# Patient Record
Sex: Female | Born: 1955
Health system: Southern US, Community
[De-identification: ages and names within clinical notes are randomized; demographics above are authoritative.]

## PROBLEM LIST (undated history)

## (undated) DIAGNOSIS — I340 Nonrheumatic mitral (valve) insufficiency: Secondary | ICD-10-CM

## (undated) DIAGNOSIS — J329 Chronic sinusitis, unspecified: Secondary | ICD-10-CM

## (undated) DIAGNOSIS — F419 Anxiety disorder, unspecified: Secondary | ICD-10-CM

## (undated) DIAGNOSIS — E7800 Pure hypercholesterolemia, unspecified: Secondary | ICD-10-CM

## (undated) DIAGNOSIS — K219 Gastro-esophageal reflux disease without esophagitis: Secondary | ICD-10-CM

## (undated) DIAGNOSIS — Z8639 Personal history of other endocrine, nutritional and metabolic disease: Secondary | ICD-10-CM

## (undated) DIAGNOSIS — R011 Cardiac murmur, unspecified: Secondary | ICD-10-CM

## (undated) DIAGNOSIS — E785 Hyperlipidemia, unspecified: Secondary | ICD-10-CM

## (undated) DIAGNOSIS — Z9889 Other specified postprocedural states: Secondary | ICD-10-CM

## (undated) DIAGNOSIS — I493 Ventricular premature depolarization: Secondary | ICD-10-CM

## (undated) DIAGNOSIS — K5790 Diverticulosis of intestine, part unspecified, without perforation or abscess without bleeding: Secondary | ICD-10-CM

## (undated) DIAGNOSIS — G4700 Insomnia, unspecified: Secondary | ICD-10-CM

## (undated) DIAGNOSIS — R7303 Prediabetes: Secondary | ICD-10-CM

## (undated) HISTORY — DX: Ventricular premature depolarization: I49.3

## (undated) HISTORY — DX: Anxiety disorder, unspecified: F41.9

## (undated) HISTORY — DX: Gastro-esophageal reflux disease without esophagitis: K21.9

## (undated) HISTORY — DX: Cardiac murmur, unspecified: R01.1

## (undated) HISTORY — PX: DILATION AND CURETTAGE OF UTERUS: SHX78

## (undated) HISTORY — DX: Pure hypercholesterolemia, unspecified: E78.00

## (undated) HISTORY — DX: Prediabetes: R73.03

## (undated) HISTORY — DX: Insomnia, unspecified: G47.00

## (undated) HISTORY — DX: Diverticulosis of intestine, part unspecified, without perforation or abscess without bleeding: K57.90

## (undated) HISTORY — DX: Hyperlipidemia, unspecified: E78.5

## (undated) HISTORY — DX: Personal history of other endocrine, nutritional and metabolic disease: Z86.39

## (undated) HISTORY — PX: BUNIONECTOMY: SHX129

## (undated) HISTORY — DX: Nonrheumatic mitral (valve) insufficiency: I34.0

---

## 2010-08-22 ENCOUNTER — Encounter
Admission: RE | Admit: 2010-08-22 | Discharge: 2010-08-22 | Payer: Self-pay | Source: Home / Self Care | Attending: Internal Medicine | Admitting: Internal Medicine

## 2010-09-07 ENCOUNTER — Other Ambulatory Visit: Payer: Self-pay | Admitting: Obstetrics and Gynecology

## 2010-09-07 ENCOUNTER — Other Ambulatory Visit
Admission: RE | Admit: 2010-09-07 | Discharge: 2010-09-07 | Payer: Self-pay | Source: Home / Self Care | Admitting: Obstetrics and Gynecology

## 2011-02-01 ENCOUNTER — Other Ambulatory Visit (HOSPITAL_COMMUNITY): Payer: Self-pay | Admitting: Cardiology

## 2011-02-01 DIAGNOSIS — R5383 Other fatigue: Secondary | ICD-10-CM

## 2011-02-21 ENCOUNTER — Ambulatory Visit (HOSPITAL_COMMUNITY)
Admission: RE | Admit: 2011-02-21 | Discharge: 2011-02-21 | Disposition: A | Discharge status: Home / Self Care | Payer: 59 | Source: Ambulatory Visit | Attending: Cardiology | Admitting: Cardiology

## 2011-02-21 DIAGNOSIS — I059 Rheumatic mitral valve disease, unspecified: Secondary | ICD-10-CM | POA: Insufficient documentation

## 2011-02-28 ENCOUNTER — Other Ambulatory Visit (HOSPITAL_COMMUNITY): Payer: Self-pay | Admitting: Cardiology

## 2011-02-28 ENCOUNTER — Encounter (HOSPITAL_COMMUNITY)
Admission: RE | Admit: 2011-02-28 | Discharge: 2011-02-28 | Disposition: A | Discharge status: Home / Self Care | Payer: 59 | Source: Ambulatory Visit | Attending: Cardiology | Admitting: Cardiology

## 2011-02-28 DIAGNOSIS — R079 Chest pain, unspecified: Secondary | ICD-10-CM | POA: Insufficient documentation

## 2011-02-28 MED ORDER — TECHNETIUM TC 99M TETROFOSMIN IV KIT
10.0000 | PACK | Freq: Once | INTRAVENOUS | Status: AC | PRN
  Administered 2011-02-28: 10 via INTRAVENOUS

## 2011-02-28 MED ORDER — TECHNETIUM TC 99M TETROFOSMIN IV KIT
30.0000 | PACK | Freq: Once | INTRAVENOUS | Status: AC | PRN
  Administered 2011-02-28: 30 via INTRAVENOUS

## 2011-03-01 ENCOUNTER — Other Ambulatory Visit (HOSPITAL_COMMUNITY): Payer: Self-pay

## 2011-03-01 ENCOUNTER — Other Ambulatory Visit (HOSPITAL_COMMUNITY): Payer: 59

## 2011-08-02 ENCOUNTER — Ambulatory Visit (INDEPENDENT_AMBULATORY_CARE_PROVIDER_SITE_OTHER): Payer: 59 | Admitting: Family Medicine

## 2011-08-02 ENCOUNTER — Encounter: Payer: Self-pay | Admitting: Family Medicine

## 2011-08-02 VITALS — BP 120/60 | HR 67 | Temp 97.6°F | Wt 174.0 lb

## 2011-08-02 DIAGNOSIS — J019 Acute sinusitis, unspecified: Secondary | ICD-10-CM

## 2011-08-02 MED ORDER — AZITHROMYCIN 250 MG PO TABS: ORAL_TABLET | ORAL | Status: AC

## 2011-08-02 MED ORDER — FLUTICASONE PROPIONATE 50 MCG/ACT NA SUSP: 2.0000 | Freq: Every day | NASAL | Status: DC

## 2011-08-02 NOTE — Patient Instructions (Signed)

## 2011-08-02 NOTE — Progress Notes (Signed)
  Subjective:    Patient ID: Megan Greer, female    DOB: 08/29/1955, 55 y.o.   MRN: [id number]  HPI 55 year old white female, former smoker, new patient in with complaints of sinus pressure, feeling weak, nasal congestion for one week. If symptoms are worsening. She is new to the area. He recently relocated from [place]. Reports a significant history of sinusitis, has had CT scans, numerous medications to treat her infections. She had an old prescription of Levaquin 250 mg that she started but did not complete because she didn't have enough medication. She denies any lightheadedness or dizziness no chest pain palpitation shortness of breath or edema.   Review of Systems  Constitutional: Negative.   HENT: Positive for congestion, rhinorrhea, postnasal drip and sinus pressure. Negative for ear pain, nosebleeds and neck pain.   Respiratory: Negative.   Cardiovascular: Negative.   Skin: Negative.   Neurological: Negative.   Psychiatric/Behavioral: Negative.       as stated above Objective:   Physical Exam  Constitutional: She is oriented to person, place, and time. She appears well-developed and well-nourished.  HENT:  Head: Normocephalic.  Right Ear: External ear normal.  Left Ear: External ear normal.  Mouth/Throat: Oropharynx is clear and moist.  Neck: Normal range of motion. Neck supple.  Cardiovascular: Normal rate and normal heart sounds.   Pulmonary/Chest: Effort normal and breath sounds normal.  Neurological: She is alert and oriented to person, place, and time.  Skin: Skin is warm and dry.          Assessment & Plan:  Assessment: Chronic sinusitis  The plan: Flonase 2 sprays in each nostril once a day. Z-Pak as directed. Consider an antihistamine over-the-counter daily. During play a fluids. Rest. Call if symptoms worsen or persist, recheck as scheduled and when necessary.

## 2011-08-16 ENCOUNTER — Emergency Department (HOSPITAL_COMMUNITY)
Admission: EM | Admit: 2011-08-16 | Discharge: 2011-08-16 | Disposition: A | Discharge status: Home / Self Care | Payer: 59 | Attending: Emergency Medicine | Admitting: Emergency Medicine

## 2011-08-16 ENCOUNTER — Telehealth: Payer: Self-pay | Admitting: Family

## 2011-08-16 ENCOUNTER — Encounter (HOSPITAL_COMMUNITY): Payer: Self-pay | Admitting: Emergency Medicine

## 2011-08-16 DIAGNOSIS — Z79899 Other long term (current) drug therapy: Secondary | ICD-10-CM | POA: Insufficient documentation

## 2011-08-16 DIAGNOSIS — R51 Headache: Secondary | ICD-10-CM | POA: Insufficient documentation

## 2011-08-16 DIAGNOSIS — J329 Chronic sinusitis, unspecified: Secondary | ICD-10-CM | POA: Insufficient documentation

## 2011-08-16 HISTORY — DX: Chronic sinusitis, unspecified: J32.9

## 2011-08-16 MED ORDER — DEXAMETHASONE SODIUM PHOSPHATE 10 MG/ML IJ SOLN
10.0000 mg | Freq: Once | INTRAMUSCULAR | Status: AC
  Administered 2011-08-16: 10 mg via INTRAMUSCULAR
  Filled 2011-08-16: qty 1

## 2011-08-16 MED ORDER — LEVOFLOXACIN 500 MG PO TABS: 500.0000 mg | ORAL_TABLET | Freq: Every day | ORAL | Status: DC

## 2011-08-16 MED ORDER — AMOXICILLIN-POT CLAVULANATE 875-125 MG PO TABS: 1.0000 | ORAL_TABLET | Freq: Once | ORAL | Status: DC

## 2011-08-16 MED ORDER — OXYCODONE-ACETAMINOPHEN 5-325 MG PO TABS: 2.0000 | ORAL_TABLET | ORAL | Status: AC | PRN

## 2011-08-16 NOTE — Telephone Encounter (Signed)
Triage Record Num: [id number] Operator: Valene Bors Patient Name: Megan Greer Call Date & Time: 08/15/2011 10:53:44AM Patient Phone: [phone] PCP: Patient Gender: Female PCP Fax : Patient DOB: March 19, 1956 [facility] Name: [facility] Reason for Call: Caller: Cedra/Patient; PCP: Thomos Lemons; CB#: [phone]; Call regarding having L sinus pain and teeth hurting on L side/L eye pain. Prescribed Zpack 10 days ago for same sx and was feeling better and now sx back. Afebrile. Hx of frequent sinus infection. She did have some blood tinged mucos on-08/14/11. She is using Flonase/ Not helping. Care advice per Face Pain Protocol and contacted MD on call, Darryll Capers, and he advised to call in another round of Zpack for 5 days per package instructions. Called to [employer] [phone] and spoke to [place], [place]. Protocol(s) Used: Face Pain or Swelling Recommended Outcome per Protocol: See Provider within 24 hours Reason for Outcome: Severe pain that continues; interferes with ability to carry out usual activities or sleep AND is not responding to 24 hours of home care Care Advice: ~ Another adult should drive. ~ Use a cool mist humidifier to moisten air. Be sure to clean according to manufacturer's instructions. ~ Call provider if signs of infection occur such as tenderness, swelling, redness, or temperature elevation. ~ SYMPTOM / CONDITION MANAGEMENT ~ CAUTIONS ~ List, or take, all current prescription(s), nonprescription or alternative medication(s) to provider for evaluation. Most adults need to drink 6-10 eight-ounce glasses (1.2-2.0 liters) of fluids per day unless previously told to limit fluid intake for other medical reasons. Limit fluids that contain caffeine, sugar or alcohol. Urine will be a very light yellow color when you drink enough fluids. ~ May have clear liquids (such as water, clear fruit juices without pulp, soda, tea or coffee without dairy or  non-dairy creamer, clear broth or bouillon, oral hydration solution, or plain gelatin, fruit ices/popsicles, hard candy) but do not eat solid foods before talking with your provider. ~ Analgesic/Antipyretic Advice - Acetaminophen: Consider acetaminophen as directed on label or by pharmacist/provider for pain or fever PRECAUTIONS: - Use if there is no history of liver disease, alcoholism, or intake of three or more alcohol drinks per day - Only if approved by provider during pregnancy or when breastfeeding - During pregnancy, acetaminophen should not be taken more than 3 consecutive days without telling provider - Do not exceed recommended dose or frequency ~ 08/15/2011 11:50:25AM Page 1 of 1 [id number]

## 2011-08-16 NOTE — ED Notes (Signed)
Pt states that since last night began to have pain to lt side of face near mouth and eye area, headache, no wekaness to body, no diff walking, states that she took a percocet to help but the pain is not any better. Pt was on a z pack for a sinus infection and had to get a 2nd z pack yesterday due to the infection did not go away, alert x4,

## 2011-08-16 NOTE — ED Notes (Signed)
[id number]<AV> Expected date:<BR> Expected time:<BR> Means of arrival:<BR> Comments:<BR> close

## 2011-08-16 NOTE — ED Provider Notes (Signed)
History     CSN: [id number]  Arrival date & time 08/16/11  0944   First MD Initiated Contact with Patient 08/16/11 1016      Chief Complaint  Patient presents with  . Headache  . Facial Pain    (Consider location/radiation/quality/duration/timing/severity/associated sxs/prior treatment) Patient is a 56 y.o. female presenting with sinusitis. The history is provided by the patient.  Sinusitis  This is a recurrent problem. The current episode started yesterday (She has had right sided facial pain for the past 2 weeks that improved with Z-pack over one ago and is now recurrent.). The problem has been gradually worsening (She reports the facial pain was intense enough last night that it kept her awake despite Percocet use.). There has been no fever. The pain is severe. The pain has been fluctuating since onset. Pertinent negatives include no chills, no ear pain and no sore throat. Associated symptoms comments: She has pain in right temple, right cheek and right upper teeth..    Past Medical History  Diagnosis Date  . Sinusitis     History reviewed. No pertinent past surgical history.  No family history on file.  History  Substance Use Topics  . Smoking status: Former Games developer  . Smokeless tobacco: Not on file  . Alcohol Use: Yes     occassional    OB History    Grav Para Term Preterm Abortions TAB SAB Ect Mult Living                  Review of Systems  Constitutional: Negative for fever and chills.  HENT: Negative.  Negative for ear pain, sore throat and facial swelling.        Right sided facial pain.  Eyes: Negative.  Negative for pain and visual disturbance.  Respiratory: Negative.   Cardiovascular: Negative.   Gastrointestinal: Negative.  Negative for vomiting and abdominal pain.  Musculoskeletal: Negative.   Skin: Negative.   Neurological: Negative.     Allergies  Review of patient's allergies indicates no known allergies.  Home Medications   Current  Outpatient Rx  Name Route Sig Dispense Refill  . AZITHROMYCIN 250 MG PO TABS Oral Take 250 mg by mouth daily. Started yesterday-zpak     . CALCIUM CARBONATE 600 MG PO TABS Oral Take 600 mg by mouth 2 (two) times daily with a meal.      . VITAMIN D 1000 UNITS PO TABS Oral Take 2,000 Units by mouth daily.      Marland Kitchen CLONAZEPAM 1 MG PO TABS Oral Take 1 mg by mouth at bedtime as needed.      Marland Kitchen ESTRADIOL 0.0375 MG/24HR TD PTTW Transdermal Place 1 patch onto the skin 2 (two) times a week.      Marland Kitchen FLUTICASONE PROPIONATE 50 MCG/ACT NA SUSP Nasal Place 2 sprays into the nose daily. 16 g 6  . GLUCOSAMINE-CHONDROITIN 500-400 MG PO TABS Oral Take 1 tablet by mouth daily.      Marland Kitchen ONE-DAILY MULTI VITAMINS PO TABS Oral Take 1 tablet by mouth daily.      Marland Kitchen PROGESTERONE MICRONIZED 100 MG PO CAPS Oral Take 100 mg by mouth daily.      Marland Kitchen RAMELTEON 8 MG PO TABS Oral Take 8 mg by mouth at bedtime.      Marland Kitchen VITAMIN C 500 MG PO TABS Oral Take 500 mg by mouth daily.        BP 150/80  Pulse 53  Resp 18  SpO2 100%  Physical Exam  Constitutional: She appears well-developed and well-nourished.  HENT:  Head: Normocephalic.    Right Ear: External ear normal.  Left Ear: External ear normal.  Mouth/Throat: Uvula is midline. Mucous membranes are not dry. Normal dentition. No dental abscesses. No posterior oropharyngeal edema.  Neck: Normal range of motion. Neck supple.  Cardiovascular: Normal rate and regular rhythm.   Pulmonary/Chest: Effort normal and breath sounds normal.  Abdominal: Soft. Bowel sounds are normal. There is no tenderness. There is no rebound and no guarding.  Musculoskeletal: Normal range of motion.  Neurological: She is alert. No cranial nerve deficit.  Skin: Skin is warm and dry. No rash noted.  Psychiatric: She has a normal mood and affect.    ED Course  Procedures (including critical care time)  Labs Reviewed - No data to display No results found.   No diagnosis found.    MDM           Rodena Medin, PA 08/16/11 1137

## 2011-08-16 NOTE — Telephone Encounter (Signed)
Pt was in 2 weeks ago with a sinus infection and went through her first rx and requested a second but is still in horrible pain. Pt is havin issues sleeping because of pain and pressure in her face. Pt took medication for pain last night and she said it didn't not touch the pain and is wanting to know what should be the next step. Pt requesting you contact her

## 2011-08-18 NOTE — ED Provider Notes (Signed)
Medical screening examination/treatment/procedure(s) were performed by non-physician practitioner and as supervising physician I was immediately available for consultation/collaboration.   Charnika Herbst, MD 08/18/11 1527 

## 2011-08-19 ENCOUNTER — Encounter (HOSPITAL_COMMUNITY): Payer: Self-pay | Admitting: Emergency Medicine

## 2011-08-19 ENCOUNTER — Emergency Department (INDEPENDENT_AMBULATORY_CARE_PROVIDER_SITE_OTHER): Payer: 59

## 2011-08-19 ENCOUNTER — Emergency Department (HOSPITAL_COMMUNITY)
Admission: EM | Admit: 2011-08-19 | Discharge: 2011-08-19 | Disposition: A | Discharge status: Home / Self Care | Payer: 59 | Source: Home / Self Care | Attending: Emergency Medicine | Admitting: Emergency Medicine

## 2011-08-19 DIAGNOSIS — G518 Other disorders of facial nerve: Secondary | ICD-10-CM

## 2011-08-19 DIAGNOSIS — G500 Trigeminal neuralgia: Secondary | ICD-10-CM

## 2011-08-19 MED ORDER — PREDNISONE 10 MG PO TABS: 40.0000 mg | ORAL_TABLET | Freq: Every day | ORAL | Status: AC

## 2011-08-19 MED ORDER — MORPHINE SULFATE 2 MG/ML IJ SOLN
2.0000 mg | Freq: Once | INTRAMUSCULAR | Status: AC
  Administered 2011-08-19: 2 mg via INTRAMUSCULAR

## 2011-08-19 MED ORDER — KETOROLAC TROMETHAMINE 10 MG PO TABS: 10.0000 mg | ORAL_TABLET | Freq: Four times a day (QID) | ORAL | Status: AC | PRN

## 2011-08-19 MED ORDER — MORPHINE SULFATE 2 MG/ML IJ SOLN
INTRAMUSCULAR | Status: AC
  Filled 2011-08-19: qty 1

## 2011-08-19 NOTE — ED Provider Notes (Signed)
History     CSN: [id number]  Arrival date & time 08/19/11  1222   First MD Initiated Contact with Patient 08/19/11 1225      Chief Complaint  Patient presents with  . Facial Pain  . Sinusitis    (Consider location/radiation/quality/duration/timing/severity/associated sxs/prior treatment) HPI Comments: Continue with pain in my sinus (points to L maxillary region) increasing for the past 3 weeks, percocet helps keep the pain under control, completed two packs of Z-pack and was called a Levaquin course. The pain shoots up to my temporal area, pain wakes me up at night, its difficult to sleep sometimes, my upper teeth also hurts"  No facial weakness No upper extremety  Weakness No paresthesias No hearing dysfunction No otlagia No fevers  Patient is a 56 y.o. female presenting with sinusitis. The history is provided by the patient.  Sinusitis  This is a recurrent problem. The current episode started more than 1 week ago. The problem has been gradually worsening. There has been no fever. The pain is at a severity of 8/10. The pain is severe. The pain has been fluctuating since onset. Associated symptoms include sinus pressure and shortness of breath. Pertinent negatives include no chills, no congestion, no ear pain, no hoarse voice, no sore throat, no swollen glands and no cough. Treatments tried: Pecocets every 4 hours. The treatment provided moderate relief.    Past Medical History  Diagnosis Date  . Sinusitis     History reviewed. No pertinent past surgical history.  No family history on file.  History  Substance Use Topics  . Smoking status: Former Games developer  . Smokeless tobacco: Not on file  . Alcohol Use: Yes     occassional    OB History    Grav Para Term Preterm Abortions TAB SAB Ect Mult Living                  Review of Systems  Constitutional: Negative for fever, chills and activity change.  HENT: Positive for sinus pressure. Negative for ear pain, congestion,  sore throat and hoarse voice.   Eyes: Negative for pain and visual disturbance.  Respiratory: Positive for shortness of breath. Negative for cough.   Gastrointestinal: Negative for nausea.  Neurological: Positive for headaches. Negative for weakness, light-headedness and numbness.    Allergies  Review of patient's allergies indicates no known allergies.  Home Medications   Current Outpatient Rx  Name Route Sig Dispense Refill  . LEVOFLOXACIN 500 MG PO TABS Oral Take 1 tablet (500 mg total) by mouth daily. 10 tablet 0  . OXYCODONE-ACETAMINOPHEN 5-325 MG PO TABS Oral Take 2 tablets by mouth every 4 (four) hours as needed for pain. 12 tablet 0  . AZITHROMYCIN 250 MG PO TABS Oral Take 250 mg by mouth daily. Started yesterday-zpak     . CALCIUM CARBONATE 600 MG PO TABS Oral Take 600 mg by mouth 2 (two) times daily with a meal.      . VITAMIN D 1000 UNITS PO TABS Oral Take 2,000 Units by mouth daily.      Marland Kitchen CLONAZEPAM 1 MG PO TABS Oral Take 1 mg by mouth at bedtime as needed.      Marland Kitchen ESTRADIOL 0.0375 MG/24HR TD PTTW Transdermal Place 1 patch onto the skin 2 (two) times a week.      Marland Kitchen FLUTICASONE PROPIONATE 50 MCG/ACT NA SUSP Nasal Place 2 sprays into the nose daily. 16 g 6  . GLUCOSAMINE-CHONDROITIN 500-400 MG PO TABS Oral Take 1  tablet by mouth daily.      Marland Kitchen ONE-DAILY MULTI VITAMINS PO TABS Oral Take 1 tablet by mouth daily.      Marland Kitchen PROGESTERONE MICRONIZED 100 MG PO CAPS Oral Take 100 mg by mouth daily.      Marland Kitchen RAMELTEON 8 MG PO TABS Oral Take 8 mg by mouth at bedtime.      Marland Kitchen VITAMIN C 500 MG PO TABS Oral Take 500 mg by mouth daily.        BP 145/71  Pulse 53  Temp(Src) 97.1 F (36.2 C) (Oral)  Resp 15  SpO2 98%  Physical Exam  Nursing note and vitals reviewed. Constitutional: She is oriented to person, place, and time. She appears well-developed and well-nourished. No distress.    HENT:  Head: Normocephalic. Head is without raccoon's eyes, without Battle's sign, without abrasion  and without contusion. No trismus in the jaw.  Right Ear: Tympanic membrane normal.  Left Ear: Tympanic membrane normal.  Mouth/Throat: Uvula is midline, oropharynx is clear and moist and mucous membranes are normal. No oral lesions. Normal dentition. No dental abscesses, lacerations or dental caries.  Eyes: Conjunctivae are normal. Pupils are equal, round, and reactive to light. No scleral icterus.  Neck: Normal range of motion. Neck supple. No hepatojugular reflux and no JVD present. Carotid bruit is not present.  Pulmonary/Chest: Effort normal.  Lymphadenopathy:    She has no cervical adenopathy.  Neurological: She is alert and oriented to person, place, and time. She has normal strength. No cranial nerve deficit or sensory deficit.  Skin: Skin is warm. No bruising and no rash noted. No erythema.    ED Course  Procedures (including critical care time)  Labs Reviewed - No data to display No results found.   No diagnosis found.    MDM  Facial Pain x 3 weeks and worsening- multiple treatments for sinusitis (patient denies any type of upper congestion- no post-nasal dripping, no rhinorrhea, no pharyngitis) 3 antibiotic cycles- treated ED and by PCP. Related previous episodes like this in the past not as severe. High narcotic demand to control pain. Referred her to see neurologist and own ENT and recommended antibiotic discontinuation. Consider trigeminal type pain        Jimmie Molly, MD 08/19/11 1523

## 2011-08-19 NOTE — ED Notes (Signed)
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

## 2011-08-19 NOTE — ED Notes (Signed)
During x-ray patient questioned why she was not shielded.  Both [employer] RT-R and myself explained that gonadal shielding is done  until the age of 45.  She then questioned why her thyroid was not shielded.  I explained to patient if it was shielded it would obstruct the image.  Patient expressed understanding and had no further questions.

## 2011-09-27 ENCOUNTER — Ambulatory Visit: Payer: 59 | Admitting: Internal Medicine

## 2011-10-26 ENCOUNTER — Other Ambulatory Visit: Payer: Self-pay

## 2011-10-26 MED ORDER — FLUTICASONE PROPIONATE 50 MCG/ACT NA SUSP: 2.0000 | Freq: Every day | NASAL | Status: DC

## 2011-11-17 ENCOUNTER — Ambulatory Visit (INDEPENDENT_AMBULATORY_CARE_PROVIDER_SITE_OTHER): Payer: 59 | Admitting: Internal Medicine

## 2011-11-17 ENCOUNTER — Encounter: Payer: Self-pay | Admitting: Internal Medicine

## 2011-11-17 VITALS — BP 114/80 | HR 68 | Temp 97.7°F | Ht 69.0 in | Wt 167.0 lb

## 2011-11-17 DIAGNOSIS — F5104 Psychophysiologic insomnia: Secondary | ICD-10-CM | POA: Insufficient documentation

## 2011-11-17 DIAGNOSIS — F329 Major depressive disorder, single episode, unspecified: Secondary | ICD-10-CM | POA: Insufficient documentation

## 2011-11-17 DIAGNOSIS — F32A Depression, unspecified: Secondary | ICD-10-CM | POA: Insufficient documentation

## 2011-11-17 DIAGNOSIS — F3289 Other specified depressive episodes: Secondary | ICD-10-CM

## 2011-11-17 DIAGNOSIS — E785 Hyperlipidemia, unspecified: Secondary | ICD-10-CM

## 2011-11-17 DIAGNOSIS — M8580 Other specified disorders of bone density and structure, unspecified site: Secondary | ICD-10-CM

## 2011-11-17 DIAGNOSIS — K529 Noninfective gastroenteritis and colitis, unspecified: Secondary | ICD-10-CM

## 2011-11-17 DIAGNOSIS — Z0000 Encounter for general adult medical examination without abnormal findings: Secondary | ICD-10-CM | POA: Insufficient documentation

## 2011-11-17 DIAGNOSIS — G4700 Insomnia, unspecified: Secondary | ICD-10-CM

## 2011-11-17 DIAGNOSIS — M899 Disorder of bone, unspecified: Secondary | ICD-10-CM

## 2011-11-17 DIAGNOSIS — K5289 Other specified noninfective gastroenteritis and colitis: Secondary | ICD-10-CM

## 2011-11-17 LAB — CBC WITH DIFFERENTIAL/PLATELET
Basophils Relative: 0.2 % (ref 0.0–3.0)
Eosinophils Absolute: 0.1 10*3/uL (ref 0.0–0.7)
Eosinophils Relative: 1.6 % (ref 0.0–5.0)
HCT: 38.9 % (ref 36.0–46.0)
Lymphs Abs: 1 10*3/uL (ref 0.7–4.0)
MCHC: 33.9 g/dL (ref 30.0–36.0)
MCV: 95.1 fl (ref 78.0–100.0)
Monocytes Absolute: 0.3 10*3/uL (ref 0.1–1.0)
RBC: 4.09 Mil/uL (ref 3.87–5.11)
WBC: 4 10*3/uL — ABNORMAL LOW (ref 4.5–10.5)

## 2011-11-17 LAB — HEPATIC FUNCTION PANEL
ALT: 158 U/L — ABNORMAL HIGH (ref 0–35)
Albumin: 3.9 g/dL (ref 3.5–5.2)
Total Protein: 6.7 g/dL (ref 6.0–8.3)

## 2011-11-17 LAB — LIPID PANEL
Cholesterol: 187 mg/dL (ref 0–200)
HDL: 49.7 mg/dL (ref >39.00)
Triglycerides: 89 mg/dL (ref 0.0–149.0)

## 2011-11-17 LAB — BASIC METABOLIC PANEL
BUN: 14 mg/dL (ref 6–23)
CO2: 27 mEq/L (ref 19–32)
Chloride: 108 mEq/L (ref 96–112)
Creatinine, Ser: 0.7 mg/dL (ref 0.4–1.2)
Potassium: 3.6 mEq/L (ref 3.5–5.1)

## 2011-11-17 MED ORDER — ONDANSETRON HCL 4 MG PO TABS: 4.0000 mg | ORAL_TABLET | Freq: Three times a day (TID) | ORAL | Status: AC | PRN

## 2011-11-17 NOTE — Assessment & Plan Note (Signed)
Patient likely has viral gastroenteritis. Use Zofran 4 mg every 8 hours as needed. We discussed BRAT diet and for the next 2 or 3 days. Patient advised to call office if symptoms persist or worsen.

## 2011-11-17 NOTE — Assessment & Plan Note (Signed)
Reviewed adult health maintenance protocols.  Patient scheduled for annual mammogram. She does not have history of abnormal Paps. Her last Pap smear was one year ago. We agreed to defer Pap and pelvic until next year. Patient up-to-date with adult vaccines. Obtain screening lipid panel and TSH.

## 2011-11-17 NOTE — Progress Notes (Signed)
Subjective:    Patient ID: Megan Greer, female    DOB: 02/12/1956, 56 y.o.   MRN: [id number]  HPI  56 year old white female to establish. She was previously followed by Dr. Eula Listen. She has past medical history of depression. She is followed by Dr. Dub Mikes and within the last month has started Wellbutrin 300 mg. She has not noticed significant change in mood but has upcoming followup visit with her psychiatrist. She also notes history of chronic insomnia. She has been on Klonopin 1 mg at bedtime for years. She is interested in potentially tapering off this medication.  She originally grew up in [place] but moved to [place] from [place], [place]. She works as an Psychologist, occupational.  She notes history of panic attacks in her early 27s when she was undergoing divorce from her alcoholic husband. She has 3 children. One daughter lives 40 minutes away. She has one granddaughter. She currently lives alone.  Patient also complains of nausea over the past 3 days. She feels achiness and mild chills. She denies vomiting or diarrhea. She works at [employer]. There's been reports of noro virus.  Review of Systems  Constitutional: Negative for activity change, she reports increase in appetite since starting Wellbutrin Eyes: Negative for visual disturbance.  Respiratory: Negative for cough, chest tightness and shortness of breath.   Cardiovascular: Negative for chest pain.  Genitourinary: Negative for difficulty urinating.  Neurological: Negative for headaches.  Gastrointestinal: Negative for abdominal pain, heartburn melena or hematochezia, she has not had screening colonoscopy Psych: Chronic insomnia GYN: No history of abnormal Paps. Previous Pap one year ago. She is due for her mammogram.    Past Medical History  Diagnosis Date  . Sinusitis     History   Social History  . Marital Status: Single    Spouse Name: N/A    Number of Children: N/A  . Years of Education: N/A   Occupational  History  . Not on file.   Social History Main Topics  . Smoking status: Former Games developer  . Smokeless tobacco: Not on file  . Alcohol Use: Yes     occassional  . Drug Use: No  . Sexually Active: Not on file   Other Topics Concern  . Not on file   Social History Narrative  . No narrative on file    No past surgical history on file.  No family history on file.  No Known Allergies  Current Outpatient Prescriptions on File Prior to Visit  Medication Sig Dispense Refill  . calcium carbonate (OS-CAL) 600 MG TABS Take 600 mg by mouth 2 (two) times daily with a meal.        . cholecalciferol (VITAMIN D) 1000 UNITS tablet Take 2,000 Units by mouth daily.        . clonazePAM (KLONOPIN) 1 MG tablet Take 1 mg by mouth at bedtime as needed.        Marland Kitchen estradiol (VIVELLE-DOT) 0.0375 MG/24HR Place 1 patch onto the skin 2 (two) times a week.        . fluticasone (FLONASE) 50 MCG/ACT nasal spray Place 2 sprays into the nose daily.  16 g  6  . glucosamine-chondroitin 500-400 MG tablet Take 1 tablet by mouth daily.        . Multiple Vitamin (MULTIVITAMIN) tablet Take 1 tablet by mouth daily.        . progesterone (PROMETRIUM) 100 MG capsule Take 100 mg by mouth daily.        Marland Kitchen  ramelteon (ROZEREM) 8 MG tablet Take 8 mg by mouth at bedtime.        . vitamin C (ASCORBIC ACID) 500 MG tablet Take 500 mg by mouth daily.          BP 114/80  Pulse 68  Temp(Src) 97.7 F (36.5 C) (Oral)  Ht 5\' 9"  (1.753 m)  Wt 167 lb (75.751 kg)  BMI 24.66 kg/m2       Objective:   Physical Exam  Constitutional: She is oriented to person, place, and time. She appears well-developed and well-nourished.  HENT:  Head: Normocephalic and atraumatic.  Right Ear: External ear normal.  Mouth/Throat: Oropharynx is clear and moist.       Good dentition, hearing is normal  Eyes: Conjunctivae are normal. Pupils are equal, round, and reactive to light.  Neck: Normal range of motion. Neck supple.       No carotid bruit    Cardiovascular: Normal rate, regular rhythm and normal heart sounds.   No murmur heard. Pulmonary/Chest: Effort normal and breath sounds normal. She has no wheezes. She has no rales.  Abdominal: Soft. Bowel sounds are normal. There is no tenderness. There is no guarding.  Musculoskeletal: Normal range of motion. She exhibits no edema.  Neurological: She is alert and oriented to person, place, and time.  Skin: Skin is warm and dry.  Psychiatric: Her behavior is normal.      Assessment & Plan:

## 2011-11-17 NOTE — Assessment & Plan Note (Signed)
She has been taking the appropriate 300 mg once daily x1 month. She has not noticed significant change in her mood. She also notes possible history of ADHD and her psychiatrist thought Wellbutrin may also help with those symptoms.

## 2011-11-20 ENCOUNTER — Telehealth: Payer: Self-pay | Admitting: Internal Medicine

## 2011-11-20 ENCOUNTER — Other Ambulatory Visit: Payer: Self-pay | Admitting: Internal Medicine

## 2011-11-20 NOTE — Telephone Encounter (Signed)
Pt is aware MD would like her to have liver ultrasound. Pt decline until she talk with MD.

## 2011-11-20 NOTE — Telephone Encounter (Signed)
Pulled frokm Triage vmail. Time stamp: 10:07. Was seen last week and had labs that showed elevated LFT's. Dr. Artist Pais said he was going to add Hepatitis tests, just to be safe. Pt neglected to tell him she's been exercising strenuously, and is aware that can cause elevated LFT's. Wants to know if that could be the case here. Also, wants to know if the Hep panel was run, because she has received no call about the labs or results. Please call ASAP. Thank you.

## 2011-11-20 NOTE — Telephone Encounter (Signed)
Patient was personally contacted on 11/17/11 re: abnormal LFTs.  I do not think strenuous exercise can cause such an elevation.  We do not have results of hepatitis panel.  We can hold off on liver u/s for now.  We will contact her when hepatitis panel is back

## 2011-11-20 NOTE — Telephone Encounter (Signed)
Pt would like MD to call her personally. Pt was seen on 11-17-2011.

## 2011-11-21 ENCOUNTER — Other Ambulatory Visit: Payer: Self-pay | Admitting: *Deleted

## 2011-11-21 ENCOUNTER — Other Ambulatory Visit: Payer: 59

## 2011-11-21 DIAGNOSIS — R945 Abnormal results of liver function studies: Secondary | ICD-10-CM

## 2011-11-21 MED ORDER — PROGESTERONE MICRONIZED 100 MG PO CAPS: 100.0000 mg | ORAL_CAPSULE | Freq: Every day | ORAL | Status: DC

## 2011-11-22 LAB — HEPATITIS C ANTIBODY: HCV Ab: NEGATIVE

## 2011-11-22 LAB — HEPATITIS B SURFACE ANTIBODY,QUALITATIVE: Hep B S Ab: POSITIVE — AB

## 2011-11-23 ENCOUNTER — Other Ambulatory Visit: Payer: Self-pay | Admitting: Internal Medicine

## 2011-11-23 ENCOUNTER — Encounter: Payer: Self-pay | Admitting: Internal Medicine

## 2011-11-23 DIAGNOSIS — R7401 Elevation of levels of liver transaminase levels: Secondary | ICD-10-CM

## 2011-11-23 DIAGNOSIS — R140 Abdominal distension (gaseous): Secondary | ICD-10-CM

## 2011-11-23 LAB — HEPATITIS B CORE ANTIBODY, TOTAL: Hep B Core Total Ab: POSITIVE — AB

## 2011-11-23 LAB — HEPATITIS B CORE ANTIBODY, IGM: Hep B C IgM: NEGATIVE

## 2011-12-29 ENCOUNTER — Ambulatory Visit: Payer: 59 | Admitting: Internal Medicine

## 2012-01-03 ENCOUNTER — Ambulatory Visit: Payer: 59 | Admitting: Internal Medicine

## 2012-01-17 ENCOUNTER — Ambulatory Visit: Payer: 59 | Admitting: Internal Medicine

## 2012-01-18 ENCOUNTER — Encounter: Payer: Self-pay | Admitting: Internal Medicine

## 2012-01-18 ENCOUNTER — Ambulatory Visit (INDEPENDENT_AMBULATORY_CARE_PROVIDER_SITE_OTHER): Payer: 59 | Admitting: Internal Medicine

## 2012-01-18 VITALS — BP 110/72 | HR 64 | Temp 98.3°F | Wt 175.0 lb

## 2012-01-18 DIAGNOSIS — F5104 Psychophysiologic insomnia: Secondary | ICD-10-CM

## 2012-01-18 DIAGNOSIS — R7401 Elevation of levels of liver transaminase levels: Secondary | ICD-10-CM

## 2012-01-18 DIAGNOSIS — G4700 Insomnia, unspecified: Secondary | ICD-10-CM

## 2012-01-18 LAB — HEPATIC FUNCTION PANEL
ALT: 24 U/L (ref 0–35)
Total Bilirubin: 0.7 mg/dL (ref 0.3–1.2)

## 2012-01-18 LAB — IBC PANEL
Iron: 115 ug/dL (ref 42–145)
Saturation Ratios: 35.5 % (ref 20.0–50.0)
Transferrin: 231.5 mg/dL (ref 212.0–360.0)

## 2012-01-18 MED ORDER — CLONAZEPAM 1 MG PO TABS: 1.0000 mg | ORAL_TABLET | Freq: Every evening | ORAL | Status: DC | PRN

## 2012-01-18 MED ORDER — GABAPENTIN 100 MG PO CAPS: ORAL_CAPSULE | ORAL | Status: DC

## 2012-01-18 NOTE — Progress Notes (Signed)
  Subjective:    Patient ID: Megan Greer, female    DOB: May 09, 1956, 56 y.o.   MRN: [id number]  HPI  56 year old white female with history of chronic insomnia for routine followup. Patient has followed up with her psychiatrist-Dr. Dub Mikes. She has been unsuccessful in her attempts to discontinue clonazepam.  Routine blood work was notable for transaminitis. She denies any history of chronic liver disease. Serologies for chronic hepatitis were negative. LFT elevation presumed secondary to drug reaction. Wellbutrin was discontinued.  Review of Systems She drinks 2 glasses of wine per day,  gastroenteritis symptoms completely resolved.    Past Medical History  Diagnosis Date  . Sinusitis     History   Social History  . Marital Status: Single    Spouse Name: N/A    Number of Children: N/A  . Years of Education: N/A   Occupational History  . Not on file.   Social History Main Topics  . Smoking status: Former Games developer  . Smokeless tobacco: Not on file  . Alcohol Use: Yes     occassional  . Drug Use: No  . Sexually Active: Not on file   Other Topics Concern  . Not on file   Social History Narrative  . No narrative on file    No past surgical history on file.  No family history on file.  No Known Allergies  Current Outpatient Prescriptions on File Prior to Visit  Medication Sig Dispense Refill  . calcium carbonate (OS-CAL) 600 MG TABS Take 600 mg by mouth 2 (two) times daily with a meal.        . cholecalciferol (VITAMIN D) 1000 UNITS tablet Take 2,000 Units by mouth daily.        Marland Kitchen estradiol (VIVELLE-DOT) 0.0375 MG/24HR Place 1 patch onto the skin 2 (two) times a week.        Marland Kitchen glucosamine-chondroitin 500-400 MG tablet Take 1 tablet by mouth daily.        . Multiple Vitamin (MULTIVITAMIN) tablet Take 1 tablet by mouth daily.        . progesterone (PROMETRIUM) 100 MG capsule Take 1 capsule (100 mg total) by mouth daily.  90 capsule  3  . vitamin C (ASCORBIC ACID) 500  MG tablet Take 500 mg by mouth daily.        Marland Kitchen DISCONTD: clonazePAM (KLONOPIN) 1 MG tablet Take 1 mg by mouth at bedtime as needed.        Marland Kitchen DISCONTD: fluticasone (FLONASE) 50 MCG/ACT nasal spray Place 2 sprays into the nose daily.  16 g  6  . gabapentin (NEURONTIN) 100 MG capsule Take one to three caps at bedtime as directed  90 capsule  3    BP 110/72  Pulse 64  Temp(Src) 98.3 F (36.8 C) (Oral)  Wt 175 lb (79.379 kg)    Objective:   Physical Exam  Constitutional: She is oriented to person, place, and time. She appears well-developed and well-nourished.  Eyes: Pupils are equal, round, and reactive to light. No scleral icterus.  Cardiovascular: Normal rate, regular rhythm and normal heart sounds.   Pulmonary/Chest: Effort normal and breath sounds normal. She has no wheezes.  Abdominal: Soft. Bowel sounds are normal. She exhibits no mass.  Neurological: She is alert and oriented to person, place, and time.  Skin: Skin is warm and dry.  Psychiatric: She has a normal mood and affect. Her behavior is normal.      Assessment & Plan:

## 2012-01-18 NOTE — Assessment & Plan Note (Signed)
Patient having difficulty tapering off clonazepam for sleep. We discussed slowly tapering over the next 3-4 months. We discussed using gabapentin 100-300 mg at bedtime.  She understands it may take 3-4 months per patient to successfully wean off clonazepam.

## 2012-01-18 NOTE — Assessment & Plan Note (Signed)
Unexplained transaminitis and 56 year old female. Antibodies for chronic hepatitis B or C. infection were negative. Symptoms may be attributable to use wellbutrin. Patient was able to discontinue. Repeat LFTs. If persistent elevation we discussed initiating additional workup.

## 2012-01-26 ENCOUNTER — Encounter: Payer: Self-pay | Admitting: Gastroenterology

## 2012-02-02 ENCOUNTER — Other Ambulatory Visit: Payer: Self-pay | Admitting: Internal Medicine

## 2012-02-02 DIAGNOSIS — Z1231 Encounter for screening mammogram for malignant neoplasm of breast: Secondary | ICD-10-CM

## 2012-02-12 ENCOUNTER — Other Ambulatory Visit: Payer: Self-pay

## 2012-02-21 ENCOUNTER — Ambulatory Visit
Admission: RE | Admit: 2012-02-21 | Discharge: 2012-02-21 | Disposition: A | Discharge status: Home / Self Care | Payer: 59 | Source: Ambulatory Visit | Attending: Internal Medicine | Admitting: Internal Medicine

## 2012-02-21 DIAGNOSIS — Z1231 Encounter for screening mammogram for malignant neoplasm of breast: Secondary | ICD-10-CM

## 2012-04-12 ENCOUNTER — Ambulatory Visit (INDEPENDENT_AMBULATORY_CARE_PROVIDER_SITE_OTHER): Payer: 59 | Admitting: Internal Medicine

## 2012-04-12 ENCOUNTER — Encounter: Payer: Self-pay | Admitting: Internal Medicine

## 2012-04-12 VITALS — BP 100/70 | Temp 97.9°F | Wt 183.0 lb

## 2012-04-12 DIAGNOSIS — M25551 Pain in right hip: Secondary | ICD-10-CM

## 2012-04-12 DIAGNOSIS — M25559 Pain in unspecified hip: Secondary | ICD-10-CM

## 2012-04-12 MED ORDER — PANTOPRAZOLE SODIUM 40 MG PO TBEC: 40.0000 mg | DELAYED_RELEASE_TABLET | Freq: Every day | ORAL | Status: DC

## 2012-04-12 NOTE — Progress Notes (Signed)
Subjective:    Patient ID: Megan Greer, female    DOB: 1955-11-10, 56 y.o.   MRN: [id number]  HPI  56 year old patient who is seen today with a chief complaint of right hip pain. This has been intermittent for the past month. She has been exercising fairly regularly at the [facility] since March of this year. 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  Pain is intermittent and does seem aggravated prolonged standing. She has been evaluated by a [facility] in the past for arthritis involving hands and knee pain.  Past Medical History  Diagnosis Date  . Sinusitis     History   Social History  . Marital Status: Single    Spouse Name: N/A    Number of Children: N/A  . Years of Education: N/A   Occupational History  . Not on file.   Social History Main Topics  . Smoking status: Former Games developer  . Smokeless tobacco: Not on file  . Alcohol Use: Yes     occassional  . Drug Use: No  . Sexually Active: Not on file   Other Topics Concern  . Not on file   Social History Narrative  . No narrative on file    No past surgical history on file.  No family history on file.  No Known Allergies  Current Outpatient Prescriptions on File Prior to Visit  Medication Sig Dispense Refill  . Biotin 10 MG CAPS Take 1 capsule by mouth daily.      . calcium carbonate (OS-CAL) 600 MG TABS Take 600 mg by mouth 2 (two) times daily with a meal.        . cholecalciferol (VITAMIN D) 1000 UNITS tablet Take 2,000 Units by mouth daily.        . clonazePAM (KLONOPIN) 1 MG tablet Take 1 tablet (1 mg total) by mouth at bedtime as needed.  30 tablet  0  . estradiol (VIVELLE-DOT) 0.0375 MG/24HR Place 1 patch onto the skin 2 (two) times a week.        . fish oil-omega-3 fatty acids 1000 MG capsule Take 2 g by mouth daily.      . fluticasone (FLONASE) 50 MCG/ACT nasal spray Place 2 sprays into the nose daily as needed.      Marland Kitchen  glucosamine-chondroitin 500-400 MG tablet Take 1 tablet by mouth daily.        . magnesium gluconate (MAGONATE) 500 MG tablet Take 500 mg by mouth daily.      . Multiple Vitamin (MULTIVITAMIN) tablet Take 1 tablet by mouth daily.        . progesterone (PROMETRIUM) 100 MG capsule Take 1 capsule (100 mg total) by mouth daily.  90 capsule  3  . vitamin C (ASCORBIC ACID) 500 MG tablet Take 500 mg by mouth daily.        . pantoprazole (PROTONIX) 40 MG tablet Take 1 tablet (40 mg total) by mouth daily.  30 tablet  3    BP 100/70  Temp 97.9 F (36.6 C) (Oral)  Wt 183 lb (83.008 kg)      Review of Systems  Musculoskeletal: Positive for arthralgias.       Objective:   Physical Exam  Constitutional: She appears well-developed and well-nourished. No distress.  Musculoskeletal:       Negative straight leg test Range of motion of both hips appeared full and did not aggravate pain  Assessment & Plan:   R Hip Pain-  She will cont prn advil which is helpful; consider x ray if worsens- doubt will reveal more than expected age related DJD

## 2012-04-12 NOTE — Patient Instructions (Signed)
It is important that you exercise regularly, at least 20 minutes 3 to 4 times per week.  If you develop chest pain or shortness of breath seek  medical attention.  You need to lose weight.  Consider a lower calorie diet and regular exercise.  Call or return to clinic prn if these symptoms worsen or fail to improve as anticipated.  

## 2012-05-03 ENCOUNTER — Telehealth: Payer: Self-pay | Admitting: Internal Medicine

## 2012-05-03 NOTE — Telephone Encounter (Signed)
Pt requesting a rx of phentermine to assist with weight loss, states she has tried working out and does not seem to be helping.  Uses [facility]

## 2012-05-06 NOTE — Telephone Encounter (Signed)
Left message for patient to call back regarding use of phentermine

## 2012-05-07 ENCOUNTER — Other Ambulatory Visit: Payer: Self-pay | Admitting: *Deleted

## 2012-05-07 MED ORDER — ZOLPIDEM TARTRATE 10 MG PO TABS: 10.0000 mg | ORAL_TABLET | Freq: Every evening | ORAL | Status: DC | PRN

## 2012-05-16 ENCOUNTER — Telehealth: Payer: Self-pay | Admitting: Internal Medicine

## 2012-05-16 DIAGNOSIS — Z0000 Encounter for general adult medical examination without abnormal findings: Secondary | ICD-10-CM

## 2012-05-16 DIAGNOSIS — K219 Gastro-esophageal reflux disease without esophagitis: Secondary | ICD-10-CM

## 2012-05-16 NOTE — Telephone Encounter (Signed)
Pt called and is req to get sch for a colonoscopy. Pt also said that Dr Artist Pais had recommend another test that pt would need to get to check for any possible problems with pts esophagus. Pt said that she can't remember name of that test. Pls call.

## 2012-05-17 NOTE — Telephone Encounter (Signed)
colonscopy and egd?

## 2012-05-17 NOTE — Telephone Encounter (Signed)
Please place order for colonoscopy and EGD.   I suggest she have OV with GI physician before procedures performed.  Dr. Leone Payor or Dr. Christella Hartigan.

## 2012-05-20 ENCOUNTER — Other Ambulatory Visit: Payer: Self-pay | Admitting: Family Medicine

## 2012-05-20 NOTE — Telephone Encounter (Signed)
Not sure of dx code.  Can you help?

## 2012-05-21 ENCOUNTER — Encounter: Payer: Self-pay | Admitting: Gastroenterology

## 2012-05-21 NOTE — Addendum Note (Signed)
Addended by: Alfred Levins D on: 05/21/2012 10:53 AM   Modules accepted: Orders

## 2012-05-21 NOTE — Telephone Encounter (Signed)
Referral order placed.

## 2012-05-29 ENCOUNTER — Ambulatory Visit: Payer: 59 | Admitting: Gastroenterology

## 2012-06-26 ENCOUNTER — Encounter: Payer: Self-pay | Admitting: Gastroenterology

## 2012-06-26 ENCOUNTER — Ambulatory Visit (INDEPENDENT_AMBULATORY_CARE_PROVIDER_SITE_OTHER): Payer: 59 | Admitting: Gastroenterology

## 2012-06-26 VITALS — BP 112/60 | HR 70 | Ht 69.0 in | Wt 172.0 lb

## 2012-06-26 DIAGNOSIS — K219 Gastro-esophageal reflux disease without esophagitis: Secondary | ICD-10-CM

## 2012-06-26 DIAGNOSIS — Z1211 Encounter for screening for malignant neoplasm of colon: Secondary | ICD-10-CM

## 2012-06-26 NOTE — Patient Instructions (Addendum)
You have been scheduled for an endoscopy with propofol. Please follow written instructions given to you at your visit today. If you use inhalers (even only as needed) or a CPAP machine, please bring them with you on the day of your procedure.  Patient advised to avoid spicy, acidic, citrus, chocolate, mints, fruit and fruit juices.  Limit the intake of caffeine, alcohol and Soda.  Don't exercise too soon after eating.  Don't lie down within 3-4 hours of eating.  Elevate the head of your bed.  Please call back in the end of November to schedule your Colonoscopy for January.   cc: Thomos Lemons, DO

## 2012-06-26 NOTE — Progress Notes (Signed)
History of Present Illness: This is a 56 year old female who relates a frequent episodes of acid reflux for about 5 years. Her symptoms were more active several years ago but after modifying her diet she only notes rare episodes of reflux about once every month or 2. She states she had a barium esophagram performed in [place] about 5 years ago which showed acid reflux and no other abnormalities. She is not previously had colonoscopy or upper endoscopy. Her father died of esophageal cancer. Denies weight loss, abdominal pain, constipation, diarrhea, change in stool caliber, melena, hematochezia, nausea, vomiting, dysphagia, chest pain.  Review of Systems: Pertinent positive and negative review of systems were noted in the above HPI section. All other review of systems were otherwise negative.  Current Medications, Allergies, Past Medical History, Past Surgical History, Family History and Social History were reviewed in [employer] record.  Physical Exam: General: Well developed , well nourished, no acute distress Head: Normocephalic and atraumatic Eyes:  sclerae anicteric, EOMI Ears: Normal auditory acuity Mouth: No deformity or lesions Neck: Supple, no masses or thyromegaly Lungs: Clear throughout to auscultation Heart: Regular rate and rhythm; no murmurs, rubs or bruits Abdomen: Soft, non tender and non distended. No masses, hepatosplenomegaly or hernias noted. Normal Bowel sounds Rectal: deferred to colonoscopy Musculoskeletal: Symmetrical with no gross deformities  Skin: No lesions on visible extremities Pulses:  Normal pulses noted Extremities: No clubbing, cyanosis, edema or deformities noted Neurological: Alert oriented x 4, grossly nonfocal Cervical Nodes:  No significant cervical adenopathy Inguinal Nodes: No significant inguinal adenopathy Psychological:  Alert and cooperative. Normal mood and affect  Assessment and Recommendations:  1. GERD. Her are  currently mild and she only needs medication on an infrequent basis. All standard antireflux measures. Continue pantoprazole as needed. I offered her the option to follow her expectantly or proceed with upper endoscopy for Barrett's screening. She prefers to proceed with Barrett's screening. The risks, benefits, and alternatives to endoscopy with possible biopsy and possible dilation were discussed with the patient and they consent to proceed.   2. Colorectal cancer screening, routine risk. The risks, benefits, and alternatives to colonoscopy with possible biopsy and possible polypectomy were discussed with the patient and they consent to proceed.

## 2012-07-15 ENCOUNTER — Telehealth: Payer: Self-pay | Admitting: Gastroenterology

## 2012-07-15 NOTE — Telephone Encounter (Signed)
OK for now charge this time. Next time I will charge if cancellation is late.

## 2012-07-16 ENCOUNTER — Encounter: Payer: 59 | Admitting: Gastroenterology

## 2012-09-11 ENCOUNTER — Other Ambulatory Visit (HOSPITAL_COMMUNITY): Payer: Self-pay | Admitting: Orthopedic Surgery

## 2012-09-11 DIAGNOSIS — M545 Low back pain, unspecified: Secondary | ICD-10-CM

## 2012-09-13 ENCOUNTER — Ambulatory Visit (HOSPITAL_COMMUNITY): Admission: RE | Admit: 2012-09-13 | Payer: 59 | Source: Ambulatory Visit

## 2012-10-04 ENCOUNTER — Telehealth: Payer: Self-pay | Admitting: Gastroenterology

## 2012-10-04 NOTE — Telephone Encounter (Signed)
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  She is no longer having any pain.  She wants to come in and discuss possible testing for gallstones.  She will come in and see Dr. Russella Dar on 10/14/12

## 2012-10-04 NOTE — Telephone Encounter (Signed)
Left message for patient to call back  

## 2012-10-16 ENCOUNTER — Ambulatory Visit: Payer: 59 | Admitting: Gastroenterology

## 2012-10-23 ENCOUNTER — Ambulatory Visit: Payer: 59 | Admitting: Gastroenterology

## 2012-12-18 ENCOUNTER — Other Ambulatory Visit: Payer: Self-pay | Admitting: Family

## 2013-05-06 ENCOUNTER — Ambulatory Visit (INDEPENDENT_AMBULATORY_CARE_PROVIDER_SITE_OTHER): Payer: 59 | Admitting: Internal Medicine

## 2013-05-06 ENCOUNTER — Encounter: Payer: Self-pay | Admitting: Internal Medicine

## 2013-05-06 VITALS — BP 140/80 | HR 65 | Temp 98.2°F | Resp 20 | Wt 168.0 lb

## 2013-05-06 DIAGNOSIS — E039 Hypothyroidism, unspecified: Secondary | ICD-10-CM | POA: Insufficient documentation

## 2013-05-06 DIAGNOSIS — F5104 Psychophysiologic insomnia: Secondary | ICD-10-CM

## 2013-05-06 DIAGNOSIS — G4700 Insomnia, unspecified: Secondary | ICD-10-CM

## 2013-05-06 MED ORDER — FLUTICASONE PROPIONATE 50 MCG/ACT NA SUSP: NASAL | Status: DC

## 2013-05-06 MED ORDER — HYDROCODONE-HOMATROPINE 5-1.5 MG/5ML PO SYRP: 5.0000 mL | ORAL_SOLUTION | Freq: Four times a day (QID) | ORAL | Status: AC | PRN

## 2013-05-06 MED ORDER — ZOLPIDEM TARTRATE 10 MG PO TABS: 5.0000 mg | ORAL_TABLET | Freq: Every evening | ORAL | Status: DC | PRN

## 2013-05-06 NOTE — Patient Instructions (Signed)
Acute sinusitis symptoms for less than 10 days are generally not helped by antibiotic therapy.  Use saline irrigation, warm  moist compresses and over-the-counter decongestants only as directed.  Call if there is no improvement in 5 to 7 days, or sooner if you develop increasing pain, fever, or any new symptoms. 

## 2013-05-06 NOTE — Progress Notes (Signed)
Subjective:    Patient ID: Megan Greer, female    DOB: 06-Aug-1956, 57 y.o.   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  She states that she started Biaxin a few days ago but discontinued do to GI upset. There's been no documented fever. She states that for the past day or so she feels improved. No productive cough wheezing or shortness of breath. Her chief complaint presently is cough that has not responded well to Tessalon  Stable medical problems include chronic insomnia and hypothyroidism.  Past Medical History  Diagnosis Date  . Sinusitis   . Insomnia   . GERD (gastroesophageal reflux disease)   . Anxiety     History   Social History  . Marital Status: Single    Spouse Name: N/A    Number of Children: 3  . Years of Education: N/A   Occupational History  . RN [employer]   Social History Main Topics  . Smoking status: Former Games developer  . Smokeless tobacco: Never Used  . Alcohol Use: Yes     Comment: 1-2 drinks daily   . Drug Use: No  . Sexual Activity: Not on file   Other Topics Concern  . Not on file   Social History Narrative   Daily caffeine     Past Surgical History  Procedure Laterality Date  . Bunionectomy      Right foot     Family History  Problem Relation Age of Onset  . Esophageal cancer Father   . Stomach cancer Father     No Known Allergies  Current Outpatient Prescriptions on File Prior to Visit  Medication Sig Dispense Refill  . AMBULATORY NON FORMULARY MEDICATION Innate Adreanal Response One tablet by mouth once daily      . calcium-vitamin D (OSCAL 500/200 D-3) 500-200 MG-UNIT per tablet Take 1 tablet by mouth 2 (two) times daily.      . Cholecalciferol (VITAMIN D3) 10000 UNITS capsule Take 10,000 Units by mouth daily.      . clonazePAM (KLONOPIN) 1 MG tablet Take 0.5 mg by mouth at bedtime as  needed.      Marland Kitchen DHEA 10 MG CAPS Take 1 capsule by mouth daily.      Marland Kitchen estradiol (VIVELLE-DOT) 0.1 MG/24HR Place 1 patch onto the skin 2 (two) times a week.      Marland Kitchen glucosamine-chondroitin 500-400 MG tablet Take 1 tablet by mouth daily.        . Multiple Vitamin (MULTIVITAMIN) tablet Take 1 tablet by mouth daily.        . progesterone (PROMETRIUM) 100 MG capsule Take 1 capsule (100 mg total) by mouth daily.  90 capsule  3  . thyroid (ARMOUR THYROID) 60 MG tablet Take 60 mg by mouth 2 (two) times daily.      . vitamin C (ASCORBIC ACID) 500 MG tablet Take 500 mg by mouth daily.        . fluticasone (FLONASE) 50 MCG/ACT nasal spray Place 2 sprays into the nose daily as needed.       No current facility-administered medications on file prior to visit.    BP 140/80  Pulse 65  Temp(Src) 98.2 F (36.8 C) (Oral)  Resp 20  Wt 168 lb (76.204 kg)  BMI 24.8 kg/m2  SpO2 97%       Review of Systems  Constitutional: Positive for activity change, appetite change and fatigue. Negative for fever, chills and diaphoresis.  HENT: Positive for congestion and sinus pressure. Negative for hearing loss, sore throat, rhinorrhea, dental problem and tinnitus.   Eyes: Negative for pain, discharge and visual disturbance.  Respiratory: Positive for cough. Negative for shortness of breath.   Cardiovascular: Negative for chest pain, palpitations and leg swelling.  Gastrointestinal: Negative for nausea, vomiting, abdominal pain, diarrhea, constipation, blood in stool and abdominal distention.  Genitourinary: Negative for dysuria, urgency, frequency, hematuria, flank pain, vaginal bleeding, vaginal discharge, difficulty urinating, vaginal pain and pelvic pain.  Musculoskeletal: Negative for joint swelling, arthralgias and gait problem.  Skin: Negative for rash.  Neurological: Negative for dizziness, syncope, speech difficulty, weakness, numbness and headaches.  Hematological: Negative for adenopathy.   Psychiatric/Behavioral: Negative for behavioral problems, dysphoric mood and agitation. The patient is not nervous/anxious.        Objective:   Physical Exam  Constitutional: She is oriented to person, place, and time. She appears well-developed and well-nourished.  HENT:  Head: Normocephalic.  Right Ear: External ear normal.  Left Ear: External ear normal.  Mouth/Throat: Oropharynx is clear and moist.  Eyes: Conjunctivae and EOM are normal. Pupils are equal, round, and reactive to light.  Neck: Normal range of motion. Neck supple. No thyromegaly present.  Cardiovascular: Normal rate, regular rhythm, normal heart sounds and intact distal pulses.   Pulmonary/Chest: Effort normal and breath sounds normal.  Abdominal: Soft. Bowel sounds are normal. She exhibits no mass. There is no tenderness.  Musculoskeletal: Normal range of motion.  Lymphadenopathy:    She has no cervical adenopathy.  Neurological: She is alert and oriented to person, place, and time.  Skin: Skin is warm and dry. No rash noted.  Psychiatric: She has a normal mood and affect. Her behavior is normal.          Assessment & Plan:   Viral URI with cough.  We'll treat symptomatically Chronic insomnia. Ambien refilled  Follow up with PCP as needed

## 2013-05-14 ENCOUNTER — Other Ambulatory Visit: Payer: Self-pay

## 2013-05-14 DIAGNOSIS — Z1231 Encounter for screening mammogram for malignant neoplasm of breast: Secondary | ICD-10-CM

## 2013-05-22 ENCOUNTER — Telehealth: Payer: Self-pay | Admitting: Internal Medicine

## 2013-05-22 MED ORDER — CLONAZEPAM 1 MG PO TABS: 0.5000 mg | ORAL_TABLET | Freq: Every evening | ORAL | Status: DC | PRN

## 2013-05-22 NOTE — Telephone Encounter (Signed)
Pt needs new rx on clonazepam 1 mg call into [facility] on [address]. Please call pt once rx had been call into [facility]

## 2013-05-22 NOTE — Telephone Encounter (Signed)
Ok to refill x 3.  Needs OV for additional refills

## 2013-05-22 NOTE — Telephone Encounter (Signed)
rx called in

## 2013-06-09 ENCOUNTER — Ambulatory Visit: Payer: 59

## 2013-07-03 ENCOUNTER — Ambulatory Visit: Payer: 59

## 2013-07-05 ENCOUNTER — Encounter: Payer: Self-pay | Admitting: Family Medicine

## 2013-07-05 ENCOUNTER — Ambulatory Visit (INDEPENDENT_AMBULATORY_CARE_PROVIDER_SITE_OTHER): Payer: 59 | Admitting: Family Medicine

## 2013-07-05 VITALS — BP 120/78 | HR 61 | Temp 97.8°F | Resp 16 | Wt 167.0 lb

## 2013-07-05 DIAGNOSIS — K4090 Unilateral inguinal hernia, without obstruction or gangrene, not specified as recurrent: Secondary | ICD-10-CM

## 2013-07-05 NOTE — Progress Notes (Signed)
  Subjective:     Megan Greer is a 57 y.o. female who presents for evaluation of abdominal pain. Onset was several days ago. Symptoms have been unchanged. The pain is described as dull. . Pain is located in the RLQ without radiation.  Aggravating factors: sitting up.  Alleviating factors: none. Associated symptoms: none. The patient denies anorexia, arthralagias, belching, chills, constipation, diarrhea, dysuria, fever, flatus, frequency, headache, hematochezia, hematuria, melena, myalgias, nausea, sweats and vomiting.  The patient's history has been marked as reviewed and updated as appropriate.  Review of Systems Pertinent items are noted in HPI.     Objective:    BP 120/78  Pulse 61  Temp(Src) 97.8 F (36.6 C) (Oral)  Resp 16  Wt 167 lb (75.751 kg)  SpO2 98% General appearance: alert, cooperative and no distress Abdomen: abnormal findings:  + R ing hernia--reducible,  soft,, no rebound or guarding   Assessment:    Abdominal pain, likely secondary to ?hernia .    Plan:    The diagnosis was discussed with the patient and evaluation and treatment plans outlined. Further follow-up plans will be based on outcome of lab/imaging studies; see orders. Follow up as needed.  Refer to surgery--- possible hernia

## 2013-07-05 NOTE — Patient Instructions (Signed)
Hernia A hernia occurs when an internal organ pushes out through a weak spot in the abdominal wall. Hernias most commonly occur in the groin and around the navel. Hernias often can be pushed back into place (reduced). Most hernias tend to get worse over time. Some abdominal hernias can get stuck in the opening (irreducible or incarcerated hernia) and cannot be reduced. An irreducible abdominal hernia which is tightly squeezed into the opening is at risk for impaired blood supply (strangulated hernia). A strangulated hernia is a medical emergency. Because of the risk for an irreducible or strangulated hernia, surgery may be recommended to repair a hernia. CAUSES   Heavy lifting.  Prolonged coughing.  Straining to have a bowel movement.  A cut (incision) made during an abdominal surgery. HOME CARE INSTRUCTIONS   Bed rest is not required. You may continue your normal activities.  Avoid lifting more than 10 pounds (4.5 kg) or straining.  Cough gently. If you are a smoker it is best to stop. Even the best hernia repair can break down with the continual strain of coughing. Even if you do not have your hernia repaired, a cough will continue to aggravate the problem.  Do not wear anything tight over your hernia. Do not try to keep it in with an outside bandage or truss. These can damage abdominal contents if they are trapped within the hernia sac.  Eat a normal diet.  Avoid constipation. Straining over long periods of time will increase hernia size and encourage breakdown of repairs. If you cannot do this with diet alone, stool softeners may be used. SEEK IMMEDIATE MEDICAL CARE IF:   You have a fever.  You develop increasing abdominal pain.  You feel nauseous or vomit.  Your hernia is stuck outside the abdomen, looks discolored, feels hard, or is tender.  You have any changes in your bowel habits or in the hernia that are unusual for you.  You have increased pain or swelling around the  hernia.  You cannot push the hernia back in place by applying gentle pressure while lying down. MAKE SURE YOU:   Understand these instructions.  Will watch your condition.  Will get help right away if you are not doing well or get worse. Document Released: 07/31/2005 Document Revised: 10/23/2011 Document Reviewed: 03/19/2008 Exit[employer] Patient Information 2014 [employer].  

## 2013-07-07 ENCOUNTER — Telehealth (INDEPENDENT_AMBULATORY_CARE_PROVIDER_SITE_OTHER): Payer: Self-pay

## 2013-07-07 NOTE — Telephone Encounter (Signed)
Pt is scheduled to come in next week with Dr Derrell Lolling to evaluate for a hernia.  She has some pain and wonders if it is ok to wait that long.  I advised it is.  She states her Dr felt a bulge.  Her bowels are moving and she is not vomiting.  I advised to watch it.  Call us if pain worsens and that there is someone on call after hours if she needs advice.  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  I advised take it easy as best as she can.  Have people assist with lifting and take Ibuprofen or Tylenol for the pain.

## 2013-07-08 ENCOUNTER — Ambulatory Visit: Payer: 59 | Admitting: Family

## 2013-07-16 ENCOUNTER — Ambulatory Visit (INDEPENDENT_AMBULATORY_CARE_PROVIDER_SITE_OTHER): Payer: 59 | Admitting: General Surgery

## 2013-07-17 ENCOUNTER — Telehealth: Payer: Self-pay | Admitting: Internal Medicine

## 2013-07-17 NOTE — Telephone Encounter (Signed)
[facility]. Dr Laury Axon referred to to Gen surg for possible hernia.. However, their office feels like pt should have at least an ultra sound and /or dx before coming to their office.  Pt would like to know what to do. pls advise.

## 2013-07-18 NOTE — Telephone Encounter (Signed)
lmom for pt to schedule appt

## 2013-07-18 NOTE — Telephone Encounter (Signed)
Could you schedule appt?

## 2013-07-18 NOTE — Telephone Encounter (Signed)
I suggest OV with me so I can assess situation.  I will determine whether [place] or CT Scan of abd and pelvis needed.

## 2013-07-22 NOTE — Telephone Encounter (Signed)
appt sche/kh °

## 2013-07-25 ENCOUNTER — Encounter: Payer: Self-pay | Admitting: Family

## 2013-07-25 ENCOUNTER — Ambulatory Visit: Payer: 59 | Admitting: Internal Medicine

## 2013-07-25 ENCOUNTER — Ambulatory Visit (INDEPENDENT_AMBULATORY_CARE_PROVIDER_SITE_OTHER): Payer: 59 | Admitting: Family

## 2013-07-25 VITALS — BP 118/80 | HR 65 | Temp 98.9°F | Wt 171.0 lb

## 2013-07-25 DIAGNOSIS — R059 Cough, unspecified: Secondary | ICD-10-CM

## 2013-07-25 DIAGNOSIS — R05 Cough: Secondary | ICD-10-CM

## 2013-07-25 DIAGNOSIS — J019 Acute sinusitis, unspecified: Secondary | ICD-10-CM

## 2013-07-25 MED ORDER — AZITHROMYCIN 250 MG PO TABS: ORAL_TABLET | ORAL | Status: DC

## 2013-07-25 NOTE — Progress Notes (Signed)
Subjective:    Patient ID: Megan Greer, female    DOB: 06-15-1956, 57 y.o.   MRN: [id number]  HPI 57 year old white female, nonsmoker, patient of Dr. Marquis Lunch. is in today with complaints of sore throat, sinus pressure and pain x3 days. He's been taking an antihistamine without much relief. Reports productive cough with yellow sputum. Has a history of sinusitis in the past. Works in the emergency department as an Charity fundraiser.   Review of Systems  Constitutional: Negative.   HENT: Positive for congestion, sinus pressure, sneezing and sore throat.   Respiratory: Positive for cough.   Cardiovascular: Negative.   Musculoskeletal: Negative.   Skin: Negative.   Allergic/Immunologic: Negative.   Neurological: Negative.   Psychiatric/Behavioral: Negative.    Past Medical History  Diagnosis Date  . Sinusitis   . Insomnia   . GERD (gastroesophageal reflux disease)   . Anxiety     History   Social History  . Marital Status: Single    Spouse Name: N/A    Number of Children: 3  . Years of Education: N/A   Occupational History  . RN [employer]   Social History Main Topics  . Smoking status: Former Games developer  . Smokeless tobacco: Never Used  . Alcohol Use: Yes     Comment: 1-2 drinks daily   . Drug Use: No  . Sexual Activity: Not on file   Other Topics Concern  . Not on file   Social History Narrative   Daily caffeine     Past Surgical History  Procedure Laterality Date  . Bunionectomy      Right foot     Family History  Problem Relation Age of Onset  . Esophageal cancer Father   . Stomach cancer Father     No Known Allergies  Current Outpatient Prescriptions on File Prior to Visit  Medication Sig Dispense Refill  . AMBULATORY NON FORMULARY MEDICATION Innate Adreanal Response One tablet by mouth once daily      . calcium-vitamin D (OSCAL 500/200 D-3) 500-200 MG-UNIT per tablet Take 1 tablet by mouth 2 (two) times daily.      . Cholecalciferol (VITAMIN D3) 10000 UNITS  capsule Take 10,000 Units by mouth daily.      . clonazePAM (KLONOPIN) 1 MG tablet Take 0.5 tablets (0.5 mg total) by mouth at bedtime as needed.  15 tablet  3  . DHEA 10 MG CAPS Take 1 capsule by mouth daily.      Marland Kitchen estradiol (VIVELLE-DOT) 0.1 MG/24HR Place 1 patch onto the skin 2 (two) times a week.      . fluticasone (FLONASE) 50 MCG/ACT nasal spray PLACE 2 SPRAYS INTO THE NOSE ONCE DAILY  16 g  2  . glucosamine-chondroitin 500-400 MG tablet Take 1 tablet by mouth daily.        . Multiple Vitamin (MULTIVITAMIN) tablet Take 1 tablet by mouth daily.        . progesterone (PROMETRIUM) 100 MG capsule Take 1 capsule (100 mg total) by mouth daily.  90 capsule  3  . thyroid (ARMOUR THYROID) 60 MG tablet Take 60 mg by mouth 2 (two) times daily.      . vitamin C (ASCORBIC ACID) 500 MG tablet Take 500 mg by mouth daily.        Marland Kitchen zolpidem (AMBIEN) 10 MG tablet Take 0.5 tablets (5 mg total) by mouth at bedtime as needed.  30 tablet  1   No current facility-administered medications on file prior to  visit.    BP 118/80  Pulse 65  Temp(Src) 98.9 F (37.2 C) (Oral)  Wt 171 lb (77.565 kg)chart    Objective:   Physical Exam  Constitutional: She is oriented to person, place, and time. She appears well-developed and well-nourished.  HENT:  Right Ear: External ear normal.  Left Ear: External ear normal.  Nose: Nose normal.  Mouth/Throat: Oropharynx is clear and moist.  Sinus tenderness to palpation of the maxillary sinuses bilaterally.  Neck: Normal range of motion.  Cardiovascular: Normal rate, regular rhythm and normal heart sounds.   Pulmonary/Chest: Effort normal and breath sounds normal.  Musculoskeletal: Normal range of motion.  Neurological: She is alert and oriented to person, place, and time.  Skin: Skin is warm and dry.  Psychiatric: She has a normal mood and affect.          Assessment & Plan:  Assessment: 1. Acute sinusitis 2. Cough  Plan: I've explained to patient that if  this is a sinusitis as a very early sinusitis. She's requested a Z-Pak given her history. I've advised Flonase 2 sprays in each nostril once a day and an antihistamine once daily. Patient probably office if symptoms worsen or persist. Recheck as scheduled, and as needed.

## 2013-07-25 NOTE — Patient Instructions (Signed)

## 2013-08-13 ENCOUNTER — Encounter: Payer: Self-pay | Admitting: Internal Medicine

## 2013-08-13 ENCOUNTER — Ambulatory Visit (INDEPENDENT_AMBULATORY_CARE_PROVIDER_SITE_OTHER): Payer: 59 | Admitting: Internal Medicine

## 2013-08-13 ENCOUNTER — Ambulatory Visit: Payer: 59 | Admitting: Internal Medicine

## 2013-08-13 VITALS — BP 122/84 | Temp 98.2°F | Ht 69.0 in | Wt 172.0 lb

## 2013-08-13 DIAGNOSIS — F341 Dysthymic disorder: Secondary | ICD-10-CM

## 2013-08-13 DIAGNOSIS — F5104 Psychophysiologic insomnia: Secondary | ICD-10-CM

## 2013-08-13 DIAGNOSIS — F32A Depression, unspecified: Secondary | ICD-10-CM

## 2013-08-13 DIAGNOSIS — R1031 Right lower quadrant pain: Secondary | ICD-10-CM

## 2013-08-13 DIAGNOSIS — F329 Major depressive disorder, single episode, unspecified: Secondary | ICD-10-CM

## 2013-08-13 DIAGNOSIS — E039 Hypothyroidism, unspecified: Secondary | ICD-10-CM

## 2013-08-13 DIAGNOSIS — G4700 Insomnia, unspecified: Secondary | ICD-10-CM

## 2013-08-13 DIAGNOSIS — G8929 Other chronic pain: Secondary | ICD-10-CM

## 2013-08-13 LAB — CBC WITH DIFFERENTIAL/PLATELET
Basophils Absolute: 0 10*3/uL (ref 0.0–0.1)
Eosinophils Relative: 1.7 % (ref 0.0–5.0)
HCT: 40.7 % (ref 36.0–46.0)
Lymphs Abs: 1.7 10*3/uL (ref 0.7–4.0)
Monocytes Relative: 8.3 % (ref 3.0–12.0)
Neutro Abs: 2.1 10*3/uL (ref 1.4–7.7)
Neutrophils Relative %: 49.3 % (ref 43.0–77.0)
Platelets: 218 10*3/uL (ref 150.0–400.0)
RDW: 12.8 % (ref 11.5–14.6)
WBC: 4.2 10*3/uL — ABNORMAL LOW (ref 4.5–10.5)

## 2013-08-13 LAB — BASIC METABOLIC PANEL
CO2: 30 mEq/L (ref 19–32)
Calcium: 9.2 mg/dL (ref 8.4–10.5)
Chloride: 105 mEq/L (ref 96–112)
Creatinine, Ser: 0.7 mg/dL (ref 0.4–1.2)
Glucose, Bld: 78 mg/dL (ref 70–99)
Sodium: 141 mEq/L (ref 135–145)

## 2013-08-13 LAB — HEPATIC FUNCTION PANEL
ALT: 20 U/L (ref 0–35)
AST: 19 U/L (ref 0–37)
Albumin: 4.1 g/dL (ref 3.5–5.2)
Bilirubin, Direct: 0.1 mg/dL (ref 0.0–0.3)
Total Bilirubin: 0.8 mg/dL (ref 0.3–1.2)

## 2013-08-13 LAB — TSH: TSH: 0.77 u[IU]/mL (ref 0.35–5.50)

## 2013-08-13 LAB — T4, FREE: Free T4: 0.71 ng/dL (ref 0.60–1.60)

## 2013-08-13 MED ORDER — CLONAZEPAM 1 MG PO TABS: 1.0000 mg | ORAL_TABLET | Freq: Every day | ORAL | Status: DC

## 2013-08-13 MED ORDER — BUSPIRONE HCL 7.5 MG PO TABS: 7.5000 mg | ORAL_TABLET | Freq: Two times a day (BID) | ORAL | Status: DC

## 2013-08-13 NOTE — Progress Notes (Signed)
Subjective:    Patient ID: Megan Greer, female    DOB: [birth year], 57 y.o.   MRN: [id number]  Abdominal Pain  57 year old white female with history of chronic insomnia, possible anxiety disorder and hyperlipidemia for followup. [facility] one half months ago for right lower quadrant pain. [facility] physician was concerned of possibility of right inguinal hernia. Patient denies any perfusion in right inguinal area.  Patient reports she noticed mild low back pain last summer. Her orthopedic physician obtain MRI of lumbar spine. She never proceeded with MRI because of her financial situation.  She does not describe her right lower quadrant discomfort is pain but a pulling sensation. She chronically has sense of abdominal fullness. She reports abdominal bloating especially with vegetable matter.  She was referred to general surgeon but missed her appointment. She is not sure she requires surgical evaluation. She has experienced intermittent vaginal bleeding. She is currently on Vivelle-Dot and progesterone.  She denies any family history of ovarian cancer or other GYN cancers.    Review of Systems  Gastrointestinal: Positive for abdominal pain.  positive for abdominal bloating.       Past Medical History  Diagnosis Date  . Sinusitis   . Insomnia   . GERD (gastroesophageal reflux disease)   . Anxiety     History   Social History  . Marital Status: Single    Spouse Name: N/A    Number of Children: 3  . Years of Education: N/A   Occupational History  . RN [employer]   Social History Main Topics  . Smoking status: Former Games developer  . Smokeless tobacco: Never Used  . Alcohol Use: Yes     Comment: 1-2 drinks daily   . Drug Use: No  . Sexual Activity: Not on file   Other Topics Concern  . Not on file   Social History Narrative   Daily caffeine     Past Surgical History  Procedure Laterality Date  . Bunionectomy     Right foot     Family History  Problem Relation Age of Onset  . Esophageal cancer Father   . Stomach cancer Father     No Known Allergies  Current Outpatient Prescriptions on File Prior to Visit  Medication Sig Dispense Refill  . AMBULATORY NON FORMULARY MEDICATION Innate Adreanal Response One tablet by mouth once daily      . calcium-vitamin D (OSCAL 500/200 D-3) 500-200 MG-UNIT per tablet Take 1 tablet by mouth 2 (two) times daily.      . Cholecalciferol (VITAMIN D3) 10000 UNITS capsule Take 10,000 Units by mouth daily.      . clonazePAM (KLONOPIN) 1 MG tablet Take 0.5 tablets (0.5 mg total) by mouth at bedtime as needed.  15 tablet  3  . DHEA 10 MG CAPS Take 1 capsule by mouth daily.      Marland Kitchen estradiol (VIVELLE-DOT) 0.1 MG/24HR Place 1 patch onto the skin 2 (two) times a week.      . fluticasone (FLONASE) 50 MCG/ACT nasal spray PLACE 2 SPRAYS INTO THE NOSE ONCE DAILY  16 g  2  . glucosamine-chondroitin 500-400 MG tablet Take 1 tablet by mouth daily.        . Multiple Vitamin (MULTIVITAMIN) tablet Take 1 tablet by mouth daily.        . progesterone (PROMETRIUM) 100 MG capsule Take 1 capsule (100 mg total) by mouth daily.  90 capsule  3  . thyroid (  ARMOUR THYROID) 60 MG tablet Take 60 mg by mouth 2 (two) times daily.      . vitamin C (ASCORBIC ACID) 500 MG tablet Take 500 mg by mouth daily.        Marland Kitchen zolpidem (AMBIEN) 10 MG tablet Take 0.5 tablets (5 mg total) by mouth at bedtime as needed.  30 tablet  1   No current facility-administered medications on file prior to visit.    BP 122/84  Temp(Src) 98.2 F (36.8 C) (Oral)  Ht 5\' 9"  (1.753 m)  Wt 172 lb (78.019 kg)  BMI 25.39 kg/m2    Objective:   Physical Exam  Constitutional: She is oriented to person, place, and time. She appears well-developed and well-nourished. No distress.  Cardiovascular: Normal rate, regular rhythm and normal heart sounds.   No murmur heard. Pulmonary/Chest: Effort normal and breath sounds normal.  She has no wheezes.  Abdominal: Soft. Bowel sounds are normal. She exhibits no mass. There is no guarding.  Right lower quadrant tenderness  Neurological: She is alert and oriented to person, place, and time. No cranial nerve deficit.  Skin: Skin is warm and dry.  Psychiatric: She has a normal mood and affect. Her behavior is normal.          Assessment & Plan:

## 2013-08-13 NOTE — Assessment & Plan Note (Signed)
Patient has chronic pulling sensation in her right lower quadrant. On exam there is no evidence of inguinal hernia. Unable to palpate abdominal mass. I am concerned she may have ovarian pathology. Obtain transvaginal ultrasound and CA 125.

## 2013-08-13 NOTE — Assessment & Plan Note (Signed)
Patient has tried multiple SSRIs in the past without improvement. SSRIs also resulted in weight gain. Patient tried Wellbutrin but experienced LFT elevation. Trial of BuSpar 7.5 mg twice a day.

## 2013-08-13 NOTE — Assessment & Plan Note (Signed)
Patient unable to taper off clonazepam. Resume 1 mg at bedtime as needed.

## 2013-08-13 NOTE — Assessment & Plan Note (Signed)
Monitor thyroid function tests. Adjust thyroid replacement accordingly.

## 2013-08-14 DIAGNOSIS — D126 Benign neoplasm of colon, unspecified: Secondary | ICD-10-CM

## 2013-08-14 HISTORY — DX: Benign neoplasm of colon, unspecified: D12.6

## 2013-08-14 LAB — CA 125: CA 125: 5.4 U/mL (ref 0.0–30.2)

## 2013-08-19 ENCOUNTER — Other Ambulatory Visit: Payer: Self-pay | Admitting: Internal Medicine

## 2013-08-19 DIAGNOSIS — R1031 Right lower quadrant pain: Secondary | ICD-10-CM

## 2013-08-20 ENCOUNTER — Ambulatory Visit (HOSPITAL_COMMUNITY)
Admission: RE | Admit: 2013-08-20 | Discharge: 2013-08-20 | Disposition: A | Discharge status: Home / Self Care | Payer: 59 | Source: Ambulatory Visit | Attending: Internal Medicine | Admitting: Internal Medicine

## 2013-08-20 DIAGNOSIS — R1031 Right lower quadrant pain: Secondary | ICD-10-CM

## 2013-08-20 DIAGNOSIS — R109 Unspecified abdominal pain: Secondary | ICD-10-CM | POA: Insufficient documentation

## 2013-08-20 DIAGNOSIS — G8929 Other chronic pain: Secondary | ICD-10-CM

## 2013-08-25 ENCOUNTER — Telehealth: Payer: Self-pay | Admitting: Internal Medicine

## 2013-08-25 DIAGNOSIS — G8929 Other chronic pain: Secondary | ICD-10-CM

## 2013-08-25 DIAGNOSIS — R1031 Right lower quadrant pain: Principal | ICD-10-CM

## 2013-08-25 NOTE — Telephone Encounter (Signed)
Patient Information:  Caller Name: Chantil  Phone: [phone]  Patient: Megan Greer, Megan Greer  Gender: Female  DOB: 1955/11/27  Age: 58 Years  PCP: [name] Doe-Hyun Herbie [place]) (Adults only)  Office Follow Up:  Does the office need to follow up with this patient?: Yes  Instructions For The Office: Pt is very anxious and frustrated.  She does not understand why this office did not F/U to get the results from [facility].  No results for this seen in her EPIC chart today.  She wants to be called immediately when these results come in and states "I should take precedence over today's pt's because I was seen last week and no one followed up on this."  She is also upset that the test was ordered to be done at [facility] and she is a Furniture conservator/restorer.  She had it done at [facility] and insurance paid 95%.   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 [name].  Please call her to advise, again she is very upset.   Symptoms  Reason For Call & Symptoms: Pt had an U/S done 08/20/13 for RLQ pain and she has not heard the results.  She had this done at [facility].  Reviewed Health History In EMR: Yes  Reviewed Medications In EMR: Yes  Reviewed Allergies In EMR: Yes  Reviewed Surgeries / Procedures: Yes  Date of Onset of Symptoms: 08/20/2013  Guideline(s) Used:  No Protocol Available - Information Only  Disposition Per Guideline:   Home Care  Reason For Disposition Reached:   Information only question and nurse able to answer  Advice Given:  N/A  RN Overrode Recommendation:  Document Patient  Pt calling about U/S results from 08/20/13 for RLQ pain ordered by Dr. [name].  She had this done at [facility].

## 2013-08-26 ENCOUNTER — Telehealth: Payer: Self-pay | Admitting: Internal Medicine

## 2013-08-26 DIAGNOSIS — R1031 Right lower quadrant pain: Secondary | ICD-10-CM

## 2013-08-26 NOTE — Telephone Encounter (Addendum)
Pt would like ultrasound results

## 2013-08-27 NOTE — Telephone Encounter (Signed)
Reviewed findings of pelvic ultrasound with the patient. Patient reports she's still having "unusual" sensation except this time it is occasionally moving to mid abdomen or right upper quadrant beneath the rib cage. She like to proceed with CT of abdomen and pelvis.

## 2013-08-27 NOTE — Addendum Note (Signed)
Addended by: Rosine Abe on: 08/27/2013 01:11 PM   Modules accepted: Orders

## 2013-09-03 ENCOUNTER — Ambulatory Visit (HOSPITAL_COMMUNITY)
Admission: RE | Admit: 2013-09-03 | Discharge: 2013-09-03 | Disposition: A | Discharge status: Home / Self Care | Payer: 59 | Source: Ambulatory Visit | Attending: Internal Medicine | Admitting: Internal Medicine

## 2013-09-03 ENCOUNTER — Encounter (HOSPITAL_COMMUNITY): Payer: Self-pay

## 2013-09-03 DIAGNOSIS — N289 Disorder of kidney and ureter, unspecified: Secondary | ICD-10-CM | POA: Insufficient documentation

## 2013-09-03 DIAGNOSIS — G8929 Other chronic pain: Secondary | ICD-10-CM

## 2013-09-03 DIAGNOSIS — R1031 Right lower quadrant pain: Secondary | ICD-10-CM | POA: Insufficient documentation

## 2013-09-03 MED ORDER — IOHEXOL 300 MG/ML  SOLN
100.0000 mL | Freq: Once | INTRAMUSCULAR | Status: AC | PRN
  Administered 2013-09-03: 100 mL via INTRAVENOUS

## 2013-09-04 ENCOUNTER — Telehealth: Payer: Self-pay | Admitting: Gastroenterology

## 2013-09-04 ENCOUNTER — Other Ambulatory Visit: Payer: Self-pay | Admitting: Internal Medicine

## 2013-09-04 DIAGNOSIS — R1031 Right lower quadrant pain: Secondary | ICD-10-CM

## 2013-09-04 DIAGNOSIS — G8929 Other chronic pain: Secondary | ICD-10-CM

## 2013-09-04 NOTE — Telephone Encounter (Signed)
OK for direct colon/egd

## 2013-09-04 NOTE — Telephone Encounter (Signed)
Patient canceled endo/colon that was scheduled for 2013.  She was having a screening for Barrett's and a colon cancer screening.  She is being referred back for RLQ pain.  Dr. Fuller Plan can she be scheduled for a double or an office visit first.  See your note from 06/26/12?

## 2013-09-04 NOTE — Assessment & Plan Note (Addendum)
Transvaginal U/S could not visualize ovaries.  CA 125 is normal.  CT of abdomen and pelvis unremarkable.  Patient experiencing intermittent right sided abdominal discomfort (RLQ to RUQ).  I suggest consultation with Dr. Fuller Plan.  She has never had screening colonoscopy.

## 2013-09-05 ENCOUNTER — Encounter: Payer: Self-pay | Admitting: Internal Medicine

## 2013-09-09 ENCOUNTER — Encounter: Payer: Self-pay | Admitting: Gastroenterology

## 2013-09-11 ENCOUNTER — Other Ambulatory Visit (HOSPITAL_COMMUNITY)
Admission: RE | Admit: 2013-09-11 | Discharge: 2013-09-11 | Disposition: A | Discharge status: Home / Self Care | Payer: 59 | Source: Ambulatory Visit | Attending: Family Medicine | Admitting: Family Medicine

## 2013-09-11 ENCOUNTER — Other Ambulatory Visit: Payer: Self-pay | Admitting: Family Medicine

## 2013-09-11 DIAGNOSIS — Z1151 Encounter for screening for human papillomavirus (HPV): Secondary | ICD-10-CM | POA: Insufficient documentation

## 2013-09-11 DIAGNOSIS — Z124 Encounter for screening for malignant neoplasm of cervix: Secondary | ICD-10-CM | POA: Insufficient documentation

## 2013-09-12 ENCOUNTER — Ambulatory Visit (AMBULATORY_SURGERY_CENTER): Payer: 59 | Admitting: *Deleted

## 2013-09-12 ENCOUNTER — Telehealth: Payer: Self-pay | Admitting: Gastroenterology

## 2013-09-12 VITALS — Ht 68.5 in | Wt 170.8 lb

## 2013-09-12 DIAGNOSIS — R1031 Right lower quadrant pain: Secondary | ICD-10-CM

## 2013-09-12 MED ORDER — MOVIPREP 100 G PO SOLR: 1.0000 | Freq: Once | ORAL | Status: DC

## 2013-09-12 NOTE — Telephone Encounter (Signed)
Patient would like to have colonoscopy as soon as possible.  She is willing to split up endo/colon to get it done.  Still having abdominal pain.  See previous phone notes.  She will come today for pre-visit and colonoscopy on 09/16/13 and EGD on 10/30/13 for barrett's screening

## 2013-09-12 NOTE — Progress Notes (Signed)
No egg or soy allergy. ewm No cpap or home 02 use. emw Pt declined emmi video. ewm No problems with past sedation. ewm

## 2013-09-16 ENCOUNTER — Ambulatory Visit (AMBULATORY_SURGERY_CENTER): Payer: 59 | Admitting: Gastroenterology

## 2013-09-16 ENCOUNTER — Encounter: Payer: Self-pay | Admitting: Gastroenterology

## 2013-09-16 VITALS — BP 111/43 | HR 54 | Temp 97.2°F | Resp 16 | Ht 68.5 in | Wt 170.0 lb

## 2013-09-16 DIAGNOSIS — R1031 Right lower quadrant pain: Secondary | ICD-10-CM

## 2013-09-16 DIAGNOSIS — D126 Benign neoplasm of colon, unspecified: Secondary | ICD-10-CM

## 2013-09-16 DIAGNOSIS — Z1211 Encounter for screening for malignant neoplasm of colon: Secondary | ICD-10-CM

## 2013-09-16 MED ORDER — SODIUM CHLORIDE 0.9 % IV SOLN: 500.0000 mL | INTRAVENOUS | Status: DC

## 2013-09-16 NOTE — Patient Instructions (Signed)
Discharge instructions given with verbal understanding. Handouts on polyps and diverticulosis. Resume previous medications. YOU HAD AN ENDOSCOPIC PROCEDURE TODAY AT THE [facility]: Refer to the procedure report that was given to you for any specific questions about what was found during the examination.  If the procedure report does not answer your questions, please call your gastroenterologist to clarify.  If you requested that your care partner not be given the details of your procedure findings, then the procedure report has been included in a sealed envelope for you to review at your convenience later.  YOU SHOULD EXPECT: Some feelings of bloating in the abdomen. Passage of more gas than usual.  Walking can help get rid of the air that was put into your GI tract during the procedure and reduce the bloating. If you had a lower endoscopy (such as a colonoscopy or flexible sigmoidoscopy) you may notice spotting of blood in your stool or on the toilet paper. If you underwent a bowel prep for your procedure, then you may not have a normal bowel movement for a few days.  DIET: Your first meal following the procedure should be a light meal and then it is ok to progress to your normal diet.  A half-sandwich or bowl of soup is an example of a good first meal.  Heavy or fried foods are harder to digest and may make you feel nauseous or bloated.  Likewise meals heavy in dairy and vegetables can cause extra gas to form and this can also increase the bloating.  Drink plenty of fluids but you should avoid alcoholic beverages for 24 hours.  ACTIVITY: Your care partner should take you home directly after the procedure.  You should plan to take it easy, moving slowly for the rest of the day.  You can resume normal activity the day after the procedure however you should NOT DRIVE or use heavy machinery for 24 hours (because of the sedation medicines used during the test).    SYMPTOMS TO REPORT  IMMEDIATELY: A gastroenterologist can be reached at any hour.  During normal business hours, 8:30 AM to 5:00 PM Monday through Friday, call [phone].  After hours and on weekends, please call the GI answering service at [phone] who will take a message and have the physician on call contact you.   Following lower endoscopy (colonoscopy or flexible sigmoidoscopy):  Excessive amounts of blood in the stool  Significant tenderness or worsening of abdominal pains  Swelling of the abdomen that is new, acute  Fever of 100F or higher  FOLLOW UP: If any biopsies were taken you will be contacted by phone or by letter within the next 1-3 weeks.  Call your gastroenterologist if you have not heard about the biopsies in 3 weeks.  Our staff will call the home number listed on your records the next business day following your procedure to check on you and address any questions or concerns that you may have at that time regarding the information given to you following your procedure. This is a courtesy call and so if there is no answer at the home number and we have not heard from you through the emergency physician on call, we will assume that you have returned to your regular daily activities without incident.  SIGNATURES/CONFIDENTIALITY: You and/or your care partner have signed paperwork which will be entered into your electronic medical record.  These signatures attest to the fact that that the information above on your After Visit Summary   has been reviewed and is understood.  Full responsibility of the confidentiality of this discharge information lies with you and/or your care-partner. 

## 2013-09-16 NOTE — Progress Notes (Signed)
A/ox3 pleased with MAC, report to Celia RN 

## 2013-09-16 NOTE — Progress Notes (Signed)
Called to room to assist during endoscopic procedure.  Patient ID and intended procedure confirmed with present staff. Received instructions for my participation in the procedure from the performing physician.  

## 2013-09-16 NOTE — Op Note (Signed)
[place]  [address] [place], [address]   COLONOSCOPY PROCEDURE REPORT  PATIENT: Megan Greer, Megan Greer  MR#: [id number] BIRTHDATE: 12-23-1955 , 80  yrs. old GENDER: Female ENDOSCOPIST: Ladene Artist, MD, [facility] REFERRED SW:[id number] Stephanie Acre, M.D. PROCEDURE DATE:  09/16/2013 PROCEDURE:   Colonoscopy with snare polypectomy First Screening Colonoscopy - Avg.  risk and is 50 yrs.  old or older Yes.  Prior Negative Screening - Now for repeat screening. N/A  History of Adenoma - Now for follow-up colonoscopy & has been > or = to 3 yrs.  N/A  Polyps Removed Today? Yes. ASA CLASS:   Class II INDICATIONS:average risk screening. MEDICATIONS: MAC sedation, administered by CRNA and propofol (Diprivan) 250mg  IV DESCRIPTION OF PROCEDURE:   After the risks benefits and alternatives of the procedure were thoroughly explained, informed consent was obtained.  A digital rectal exam revealed no abnormalities of the rectum.   The LB [id number]  endoscope was introduced through the anus and advanced to the cecum, which was identified by both the appendix and ileocecal valve. No adverse events experienced.   Limited by a tortuous colon.   The quality of the prep was good, using MoviPrep  The instrument was then slowly withdrawn as the colon was fully examined.  COLON FINDINGS: A sessile polyp measuring 7 mm in size was found in the transverse colon.  A polypectomy was performed with a cold snare.  The resection was complete and the polyp tissue was completely retrieved.   Mild diverticulosis was noted in the sigmoid colon.   The colon was otherwise normal.  There was no diverticulosis, inflammation, polyps or cancers unless previously stated.  Retroflexed views revealed no abnormalities. The time to cecum=7 minutes 19 seconds.  Withdrawal time=12 minutes 22 seconds. The scope was withdrawn and the procedure completed. COMPLICATIONS: There were no complications.  ENDOSCOPIC  IMPRESSION: 1.   Sessile polyp measuring 7 mm in the transverse colon; polypectomy was performed with a cold snare 2.   Mild diverticulosis in the sigmoid colon  RECOMMENDATIONS: 1.  Await pathology results 2.  Repeat colonoscopy in 5 years if polyp adenomatous; otherwise 10 years 3.  No GI cause for RLQ pain noted 4.  High fiber diet with liberal fluid intake.  eSigned:  Ladene Artist, MD, [facility] 09/16/2013 10:33 AM

## 2013-09-17 ENCOUNTER — Telehealth: Payer: Self-pay | Admitting: *Deleted

## 2013-09-17 NOTE — Telephone Encounter (Signed)
  Follow up Call-  Call back number 09/16/2013  Post procedure Call Back phone  # [phone]  Permission to leave phone message Yes     Patient questions:  Do you have a fever, pain , or abdominal swelling? no Pain Score  0 *  Have you tolerated food without any problems? yes  Have you been able to return to your normal activities? yes  Do you have any questions about your discharge instructions: Diet   no Medications  no Follow up visit  no  Do you have questions or concerns about your Care? no  Actions: * If pain score is 4 or above: No action needed, pain <4.

## 2013-09-23 ENCOUNTER — Encounter: Payer: Self-pay | Admitting: Gastroenterology

## 2013-10-23 ENCOUNTER — Other Ambulatory Visit (HOSPITAL_COMMUNITY): Payer: Self-pay | Admitting: Family Medicine

## 2013-10-23 DIAGNOSIS — R109 Unspecified abdominal pain: Secondary | ICD-10-CM

## 2013-10-27 ENCOUNTER — Other Ambulatory Visit: Payer: Self-pay | Admitting: Obstetrics and Gynecology

## 2013-10-27 DIAGNOSIS — R928 Other abnormal and inconclusive findings on diagnostic imaging of breast: Secondary | ICD-10-CM

## 2013-10-30 ENCOUNTER — Encounter: Payer: 59 | Admitting: Gastroenterology

## 2013-10-30 ENCOUNTER — Ambulatory Visit
Admission: RE | Admit: 2013-10-30 | Discharge: 2013-10-30 | Disposition: A | Discharge status: Home / Self Care | Payer: 59 | Source: Ambulatory Visit | Attending: Obstetrics and Gynecology | Admitting: Obstetrics and Gynecology

## 2013-10-30 ENCOUNTER — Other Ambulatory Visit: Payer: Self-pay | Admitting: Gastroenterology

## 2013-10-30 ENCOUNTER — Ambulatory Visit (AMBULATORY_SURGERY_CENTER): Payer: 59 | Admitting: Gastroenterology

## 2013-10-30 ENCOUNTER — Encounter: Payer: Self-pay | Admitting: Gastroenterology

## 2013-10-30 ENCOUNTER — Other Ambulatory Visit: Payer: Self-pay | Admitting: Internal Medicine

## 2013-10-30 VITALS — BP 121/58 | HR 68 | Temp 98.1°F | Resp 18 | Ht 68.0 in | Wt 170.0 lb

## 2013-10-30 DIAGNOSIS — R928 Other abnormal and inconclusive findings on diagnostic imaging of breast: Secondary | ICD-10-CM

## 2013-10-30 DIAGNOSIS — K219 Gastro-esophageal reflux disease without esophagitis: Secondary | ICD-10-CM

## 2013-10-30 DIAGNOSIS — D131 Benign neoplasm of stomach: Secondary | ICD-10-CM

## 2013-10-30 DIAGNOSIS — K2940 Chronic atrophic gastritis without bleeding: Secondary | ICD-10-CM

## 2013-10-30 MED ORDER — OMEPRAZOLE 20 MG PO CPDR: DELAYED_RELEASE_CAPSULE | ORAL | Status: DC

## 2013-10-30 MED ORDER — SODIUM CHLORIDE 0.9 % IV SOLN: 500.0000 mL | INTRAVENOUS | Status: DC

## 2013-10-30 NOTE — Progress Notes (Signed)
Called to room to assist during endoscopic procedure.  Patient ID and intended procedure confirmed with present staff. Received instructions for my participation in the procedure from the performing physician.  

## 2013-10-30 NOTE — Patient Instructions (Signed)

## 2013-10-30 NOTE — Progress Notes (Signed)
Report to pacu rn, vss, bbs=clear 

## 2013-10-30 NOTE — Op Note (Signed)
[place]  [address]. [place], [address]   ENDOSCOPY PROCEDURE REPORT  PATIENT: Megan Greer, Megan Greer  MR#: [id number] BIRTHDATE: 25-Jan-1956 , 47  yrs. old GENDER: Female ENDOSCOPIST: Ladene Artist, MD, [facility] REFERRED BY:  Jonathon Jordan, M.D. PROCEDURE DATE:  10/30/2013 PROCEDURE:  EGD w/ biopsy ASA CLASS:     Class II INDICATIONS:  History of esophageal reflux.   Barrett's screening. MEDICATIONS: MAC sedation, administered by CRNA and propofol (Diprivan) 140mg  IV TOPICAL ANESTHETIC: Cetacaine Spray DESCRIPTION OF PROCEDURE: After the risks benefits and alternatives of the procedure were thoroughly explained, informed consent was obtained.  The LB [id number] endoscope was introduced through the mouth and advanced to the second portion of the duodenum. Without limitations.  The instrument was slowly withdrawn as the mucosa was fully examined.  ESOPHAGUS: There was LA Class A esophagitis noted.   The esophagus was otherwise normal. STOMACH: A sessile polyp measuring 4 mm in size was found in the gastric fundus.  Multiple biopsies was performed.   The stomach otherwise appeared normal. DUODENUM: The duodenal mucosa showed no abnormalities in the bulb and second portion of the duodenum.  Retroflexed views revealed a small hiatal hernia.     The scope was then withdrawn from the patient and the procedure completed.  COMPLICATIONS: There were no complications.  ENDOSCOPIC IMPRESSION: 1.   LA Class A esophagitis 2.   Small hiatal hernia 3.   Sessile polyp measuring 4 mm in the gastric fundus  RECOMMENDATIONS: 1.  Anti-reflux regimen long term 2.  Await pathology results 3.  Continue PPI qam long term: omeprazole 20 mg po qam, 1 year of refills   eSigned:  Ladene Artist, MD, [facility] 10/30/2013 9:02 AM

## 2013-10-31 ENCOUNTER — Telehealth: Payer: Self-pay | Admitting: *Deleted

## 2013-10-31 NOTE — Telephone Encounter (Signed)
Number identifier, left message, follow-up  

## 2013-11-03 ENCOUNTER — Ambulatory Visit (HOSPITAL_COMMUNITY): Payer: 59

## 2013-11-05 ENCOUNTER — Encounter: Payer: Self-pay | Admitting: Gastroenterology

## 2013-12-13 ENCOUNTER — Encounter: Payer: Self-pay | Admitting: *Deleted

## 2013-12-16 ENCOUNTER — Telehealth: Payer: Self-pay | Admitting: Gastroenterology

## 2013-12-16 NOTE — Telephone Encounter (Signed)
Left message for patient to call back  

## 2013-12-17 NOTE — Telephone Encounter (Signed)
Left message for patient to call back  

## 2013-12-22 NOTE — Telephone Encounter (Signed)
No return call from the patient.  Will await a return call

## 2014-08-22 ENCOUNTER — Emergency Department (HOSPITAL_BASED_OUTPATIENT_CLINIC_OR_DEPARTMENT_OTHER)
Admission: EM | Admit: 2014-08-22 | Discharge: 2014-08-22 | Disposition: A | Discharge status: Home / Self Care | Payer: 59 | Attending: Emergency Medicine | Admitting: Emergency Medicine

## 2014-08-22 ENCOUNTER — Emergency Department (HOSPITAL_BASED_OUTPATIENT_CLINIC_OR_DEPARTMENT_OTHER): Payer: 59

## 2014-08-22 ENCOUNTER — Encounter (HOSPITAL_BASED_OUTPATIENT_CLINIC_OR_DEPARTMENT_OTHER): Payer: Self-pay | Admitting: Emergency Medicine

## 2014-08-22 DIAGNOSIS — S92911A Unspecified fracture of right toe(s), initial encounter for closed fracture: Secondary | ICD-10-CM

## 2014-08-22 DIAGNOSIS — Z79899 Other long term (current) drug therapy: Secondary | ICD-10-CM | POA: Diagnosis not present

## 2014-08-22 DIAGNOSIS — Z87891 Personal history of nicotine dependence: Secondary | ICD-10-CM | POA: Diagnosis not present

## 2014-08-22 DIAGNOSIS — Z7951 Long term (current) use of inhaled steroids: Secondary | ICD-10-CM | POA: Insufficient documentation

## 2014-08-22 DIAGNOSIS — K219 Gastro-esophageal reflux disease without esophagitis: Secondary | ICD-10-CM | POA: Insufficient documentation

## 2014-08-22 DIAGNOSIS — Z8669 Personal history of other diseases of the nervous system and sense organs: Secondary | ICD-10-CM | POA: Insufficient documentation

## 2014-08-22 DIAGNOSIS — Y9389 Activity, other specified: Secondary | ICD-10-CM | POA: Insufficient documentation

## 2014-08-22 DIAGNOSIS — R011 Cardiac murmur, unspecified: Secondary | ICD-10-CM | POA: Diagnosis not present

## 2014-08-22 DIAGNOSIS — X58XXXA Exposure to other specified factors, initial encounter: Secondary | ICD-10-CM | POA: Diagnosis not present

## 2014-08-22 DIAGNOSIS — Z8709 Personal history of other diseases of the respiratory system: Secondary | ICD-10-CM | POA: Diagnosis not present

## 2014-08-22 DIAGNOSIS — Y998 Other external cause status: Secondary | ICD-10-CM | POA: Diagnosis not present

## 2014-08-22 DIAGNOSIS — F419 Anxiety disorder, unspecified: Secondary | ICD-10-CM | POA: Diagnosis not present

## 2014-08-22 DIAGNOSIS — S99911A Unspecified injury of right ankle, initial encounter: Secondary | ICD-10-CM | POA: Diagnosis present

## 2014-08-22 DIAGNOSIS — Y9289 Other specified places as the place of occurrence of the external cause: Secondary | ICD-10-CM | POA: Diagnosis not present

## 2014-08-22 DIAGNOSIS — S92411A Displaced fracture of proximal phalanx of right great toe, initial encounter for closed fracture: Secondary | ICD-10-CM | POA: Diagnosis not present

## 2014-08-22 DIAGNOSIS — R52 Pain, unspecified: Secondary | ICD-10-CM

## 2014-08-22 MED ORDER — OXYCODONE-ACETAMINOPHEN 5-325 MG PO TABS: ORAL_TABLET | ORAL | Status: DC

## 2014-08-22 NOTE — ED Notes (Signed)
Pt presents to ED with complaints of right foot pain for the past  2 weeks and right lower back pain with worsen last night.

## 2014-08-22 NOTE — Discharge Instructions (Signed)
Use the crutches and Aircast for the first 2-3 days.  You can transition to the Cam Walker in 3 days as your comfort permits.  Take percocet for breakthrough pain, do not drink alcohol, drive, care for children or do other critical tasks while taking percocet.  Please be very careful not to fall! The pain medication and crutches puts you at risk for falls. Please rest as much as possible and try to not stay alone.

## 2014-08-22 NOTE — ED Provider Notes (Signed)
CSN: [id number]     Arrival date & time 08/22/14  1112 History   First MD Initiated Contact with Patient 08/22/14 1211     Chief Complaint  Patient presents with  . Foot Pain     (Consider location/radiation/quality/duration/timing/severity/associated sxs/prior Treatment) HPI   Megan Greer is a 59 y.o. female complaining of pain to right foot after she hit the foot on an object 2 weeks ago. Pain has been minimal but exacerbated by weightbearing. Patient went shopping yesterday and flats with little support and the pain was severe last night, it stopped her from sleeping, patient has history of bunionectomy on the same foot, she took narcotic pain medication at home with minimal relief. She rates her pain at 5 out of 10 and it's described as aching.  Past Medical History  Diagnosis Date  . Sinusitis   . Insomnia   . GERD (gastroesophageal reflux disease)   . Anxiety   . Mitral valve regurgitation     mild  . Heart murmur    Past Surgical History  Procedure Laterality Date  . Bunionectomy      Right foot   . Dilation and curettage of uterus     Family History  Problem Relation Age of Onset  . Esophageal cancer Father   . Stomach cancer Father   . Colon cancer Neg Hx   . Rectal cancer Neg Hx   . Diabetes Mother   . Heart disease Sister    History  Substance Use Topics  . Smoking status: Former Research scientist (life sciences)  . Smokeless tobacco: Never Used  . Alcohol Use: Yes     Comment: 1-2 drinks daily    OB History    No data available     Review of Systems  10 systems reviewed and found to be negative, except as noted in the HPI.   Allergies  Review of patient's allergies indicates no known allergies.  Home Medications   Prior to Admission medications   Medication Sig Start Date End Date Taking? Authorizing Provider  buPROPion (WELLBUTRIN XL) 150 MG 24 hr tablet Take 150 mg by mouth daily.    Historical Provider, MD  calcium-vitamin D (OSCAL 500/200 D-3) 500-200 MG-UNIT  per tablet Take 1 tablet by mouth 2 (two) times daily.    Historical Provider, MD  Cholecalciferol (VITAMIN D3) 10000 UNITS capsule Take 10,000 Units by mouth daily.    Historical Provider, MD  clonazePAM (KLONOPIN) 1 MG tablet TAKE 1 TABLET BY MOUTH ONCE DAILY AT BEDTIME    Doe-Hyun R Yoo, DO  DHEA 10 MG CAPS Take 1 capsule by mouth daily.    Historical Provider, MD  estradiol (VIVELLE-DOT) 0.075 MG/24HR Place 1 patch onto the skin 2 (two) times a week.    Historical Provider, MD  fluticasone (FLONASE) 50 MCG/ACT nasal spray PLACE 2 SPRAYS INTO THE NOSE ONCE DAILY 05/06/13   Marletta Lor, MD  glucosamine-chondroitin 500-400 MG tablet Take 1 tablet by mouth daily.      Historical Provider, MD  lisdexamfetamine (VYVANSE) 60 MG capsule Take 60 mg by mouth every morning.    Historical Provider, MD  Multiple Vitamin (MULTIVITAMIN) tablet Take 1 tablet by mouth daily.      Historical Provider, MD  omeprazole (PRILOSEC) 20 MG capsule Take 1 tablet 30 minutes before breakfast 10/30/13   Ladene Artist, MD  progesterone (PROMETRIUM) 100 MG capsule Take 1 capsule (100 mg total) by mouth daily. 11/21/11   Doe-Hyun Kyra Searles, DO  thyroid (  ARMOUR THYROID) 60 MG tablet Take 60 mg by mouth daily before breakfast.     Historical Provider, MD   BP 115/94 mmHg  Pulse 63  Temp(Src) 98.2 F (36.8 C) (Oral)  Resp 18  Wt 170 lb (77.111 kg)  SpO2 99% Physical Exam  Constitutional: She is oriented to person, place, and time. She appears well-developed and well-nourished. No distress.  HENT:  Head: Normocephalic.  Eyes: Conjunctivae and EOM are normal.  Cardiovascular: Normal rate.   Pulmonary/Chest: Effort normal. No stridor.  Abdominal: Soft.  Musculoskeletal: Normal range of motion.       Feet:  Ecchymoses and tenderness to palpation at the right 3rd, 4th metatarsal phalangeal joint. She has excellent range of motion, she is distally neurovascularly intact. There is a remote, well-healed surgical scar on  the far medial side from bunionectomy.  Neurological: She is alert and oriented to person, place, and time.  Psychiatric: She has a normal mood and affect.  Nursing note and vitals reviewed.   ED Course  SPLINT APPLICATION Date/Time: 01/16/9934 12:51 PM Performed by: Monico Blitz Authorized by: Monico Blitz Consent: Verbal consent obtained. Risks and benefits: risks, benefits and alternatives were discussed Consent given by: patient Patient identity confirmed: verbally with patient Location: Right foot. Splint type: short leg Supplies used: cotton padding,  Ortho-Glass and elastic bandage Post-procedure: The splinted body part was neurovascularly unchanged following the procedure. Patient tolerance: Patient tolerated the procedure well with no immediate complications   (including critical care time) Labs Review Labs Reviewed - No data to display  Imaging Review No results found.   EKG Interpretation None      MDM   Final diagnoses:  Fracture, phalanx, foot, right, closed, initial encounter    Filed Vitals:   08/22/14 1120  BP: 115/94  Pulse: 63  Temp: 98.2 F (36.8 C)  TempSrc: Oral  Resp: 18  Weight: 170 lb (77.111 kg)  SpO2: 99%     Megan Greer is a pleasant 59 y.o. female presenting with severe right foot pain. Patient had trauma 2 weeks ago, she was walking on a unsupported flat shoes yesterday and the pain increased sharply last night. Patient has ecchymoses, bruising and tenderness to palpation on the area just lateral the x-ray suggests a minimally displaced fracture. Patient will be put in a short leg splint and given crutches in the ED, I will give her the Cam Walker to take home with instructions to transition to the Cam Walker in 3-4 days as her comfort permits. She will be given a podiatry referral.  Evaluation does not show pathology that would require ongoing emergent intervention or inpatient treatment. Pt is hemodynamically stable  and mentating appropriately. Discussed findings and plan with patient/guardian, who agrees with care plan. All questions answered. Return precautions discussed and outpatient follow up given.   New Prescriptions   OXYCODONE-ACETAMINOPHEN (PERCOCET/ROXICET) 5-325 MG PER TABLET    1 to 2 tabs PO q6hrs  PRN for pain         Monico Blitz, PA-C 08/22/14 [place], PA-C 08/22/14 1332  Malvin Johns, MD 08/22/14 1556

## 2014-08-24 ENCOUNTER — Encounter: Payer: Self-pay | Admitting: Podiatry

## 2014-08-24 ENCOUNTER — Ambulatory Visit (INDEPENDENT_AMBULATORY_CARE_PROVIDER_SITE_OTHER): Payer: 59

## 2014-08-24 ENCOUNTER — Ambulatory Visit (INDEPENDENT_AMBULATORY_CARE_PROVIDER_SITE_OTHER): Payer: 59 | Admitting: Podiatry

## 2014-08-24 VITALS — BP 139/72 | HR 70 | Resp 16

## 2014-08-24 DIAGNOSIS — M779 Enthesopathy, unspecified: Secondary | ICD-10-CM

## 2014-08-24 NOTE — Progress Notes (Signed)
   Subjective:    Patient ID: Megan Greer, female    DOB: 1956-04-15, 59 y.o.   MRN: [id number]  HPI Comments: "I injured my foot"  Patient c/o aching, burning forefoot right foot for about 2 weeks. She hit her foot and has been hurting since. The area is red, swollen and bruised. Has gotten worse after wearing a pair of flats while shopping.  She did present to the ER-xrayed, showed fracture, put in a leg splint, crutches, and was given a boot. Given a Rx for oxycodone. She is still having episodes of pain.      Review of Systems  Musculoskeletal: Positive for arthralgias.  All other systems reviewed and are negative.      Objective:   Physical Exam        Assessment & Plan:

## 2014-08-26 NOTE — Progress Notes (Signed)
Subjective:     Patient ID: Megan Greer, female   DOB: [birth year], 59 y.o.   MRN: [id number]  HPI patient presents stating she is still having some pain in her right foot when she traumatized it and that it's hard for her to walk. States it happened about 2 weeks ago she went to the ER and she's concerned as to what her problem may be but she is wearing a walking boot   Review of Systems  All other systems reviewed and are negative.      Objective:   Physical Exam  Constitutional: She is oriented to person, place, and time.  Cardiovascular: Intact distal pulses.   Musculoskeletal: Normal range of motion.  Neurological: She is oriented to person, place, and time.  Skin: Skin is warm.  Nursing note and vitals reviewed.  neurovascular status intact with muscle strength adequate range of motion of the subtalar and midtarsal joint within normal limits. Patient's noted to have bruising of the forefoot right with discomfort around the fourth metatarsal shaft and into the digits with the shaft itself being much more tender than any other area. Has been wearing a walking.     Assessment:     Inflammatory changes consistent with possible stress fracture or traumatic fracture right    Plan:     Reviewed x-rays and also x-rays that I took and discussed continued immobilization ice and elevation. If symptoms persist another 3-4 weeks she will seen back by me

## 2014-09-30 ENCOUNTER — Other Ambulatory Visit: Payer: Self-pay

## 2014-09-30 DIAGNOSIS — Z1231 Encounter for screening mammogram for malignant neoplasm of breast: Secondary | ICD-10-CM

## 2014-10-21 ENCOUNTER — Encounter: Payer: 59 | Attending: Family Medicine | Admitting: Dietician

## 2014-10-21 ENCOUNTER — Encounter: Payer: Self-pay | Admitting: Dietician

## 2014-10-21 VITALS — Ht 69.0 in | Wt 172.1 lb

## 2014-10-21 DIAGNOSIS — E663 Overweight: Secondary | ICD-10-CM | POA: Diagnosis not present

## 2014-10-21 DIAGNOSIS — Z713 Dietary counseling and surveillance: Secondary | ICD-10-CM | POA: Insufficient documentation

## 2014-10-21 NOTE — Patient Instructions (Signed)
Avoid electronic devices in the hour before bed. Look into ways to limit/reduce stress (work in the yard). Try to eat meals at the table with no distractions. Take 20 minutes to eat. Chew 20-30 x per bite. Put your fork down between. If you are hungry, have a snack. Have protein and carbs together. Portion them out.  Fill half of your plate with vegetables at lunch and dinner. Have protein and carbs the size of the palm of your hand. Look into strength training exercise, yoga, and pilates.

## 2014-10-21 NOTE — Progress Notes (Signed)
  Medical Nutrition Therapy:  Appt start time: 1620 end time:  1730.   Assessment:  Primary concerns today: Megan Greer is here today since she is concerned about avoiding diabetes. Hgb A1c was 5.6% at last visit. Cholesterol level is also high (280) but LDL and HDL is good. Gained about 10-12 lbs in the past year mostly around the middle, feels most likely d/t inactivity. Typical weight is 160 lbs and would rather be 150 lbs. Has been inactive this winter.   Interested in learning about the "best diet for her". Works as a Advice worker at [employer]. Lives by herself since her children are grown. Does not miss or skip any meals and prepares most of meals. Feels like her portion sizes are good. Doesn't eat many processed foods. Has a good appetite. Hasn't been eating much rice or pasta. Eats out about 2 x week.   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 Taking Kavanace (supplement) and magesium to help with sleep.   Started dieting lately ([place] Diet) for 3 days and lost about 3 lbs. Feeling hungry.   Preferred Learning Style:   No preference indicated   Learning Readiness:   Ready  MEDICATIONS: see list   DIETARY INTAKE:  Usual eating pattern includes 3 meals and 0 snacks per day.  Avoided foods include: organ meats    24-hr recall:  Following [place] diet - low carb for 14 days  B ( AM): 1 piece of Ezeichal bread (no butter) with fruit Snk ( AM): none L ( PM): fruit salad (as much as she wants) or just cheese or vegetables Snk ( PM): none D ( PM): chicken sometimes 2 chicken breast  or hamburger (no bun) Snk ( PM): none Beverages: water, coffee with 1% milk and stevia  Usual physical activity: walking, exercise classes (yoga, pilates)   Estimated energy needs: 1600 calories 180 g carbohydrates 120 g protein 44 g fat  Progress Towards Goal(s):  In progress.   Nutritional Diagnosis:  NB-1.1  Food and nutrition-related knowledge deficit As related to hx of frequent dieting and weight fluctuations.  As evidenced by 10-12 lb weight gain in the past year.    Intervention:  Nutrition counseling provided. Plan: Avoid electronic devices in the hour before bed. Look into ways to limit/reduce stress (work in the yard). Try to eat meals at the table with no distractions. Take 20 minutes to eat. Chew 20-30 x per bite. Put your fork down between. If you are hungry, have a snack. Have protein and carbs together. Portion them out.  Fill half of your plate with vegetables at lunch and dinner. Have protein and carbs the size of the palm of your hand. Look into strength training exercise, yoga, and pilates.  Teaching Method Utilized:  Visual Auditory Hands on  Handouts given during visit include:  MyPlate Handout  15 g CHO Snacks  Barriers to learning/adherence to lifestyle change: stress  Demonstrated degree of understanding via:  Teach Back   Monitoring/Evaluation:  Dietary intake, exercise, and body weight prn.

## 2014-10-26 ENCOUNTER — Ambulatory Visit
Admission: RE | Admit: 2014-10-26 | Discharge: 2014-10-26 | Disposition: A | Discharge status: Home / Self Care | Payer: 59 | Source: Ambulatory Visit

## 2014-10-26 DIAGNOSIS — Z1231 Encounter for screening mammogram for malignant neoplasm of breast: Secondary | ICD-10-CM

## 2014-11-09 ENCOUNTER — Other Ambulatory Visit: Payer: Self-pay | Admitting: Internal Medicine

## 2014-11-25 ENCOUNTER — Ambulatory Visit: Payer: 59 | Admitting: Gynecology

## 2015-01-29 ENCOUNTER — Other Ambulatory Visit: Payer: Self-pay | Admitting: Gastroenterology

## 2015-02-26 ENCOUNTER — Other Ambulatory Visit (HOSPITAL_COMMUNITY): Payer: Self-pay | Admitting: Otolaryngology

## 2015-02-26 DIAGNOSIS — R221 Localized swelling, mass and lump, neck: Secondary | ICD-10-CM

## 2015-03-16 ENCOUNTER — Ambulatory Visit (HOSPITAL_COMMUNITY): Payer: 59

## 2015-03-30 ENCOUNTER — Ambulatory Visit (HOSPITAL_COMMUNITY)
Admission: RE | Admit: 2015-03-30 | Discharge: 2015-03-30 | Disposition: A | Discharge status: Home / Self Care | Payer: 59 | Source: Ambulatory Visit | Attending: Otolaryngology | Admitting: Otolaryngology

## 2015-03-30 DIAGNOSIS — R221 Localized swelling, mass and lump, neck: Secondary | ICD-10-CM | POA: Insufficient documentation

## 2015-03-30 MED ORDER — IOHEXOL 300 MG/ML  SOLN
100.0000 mL | Freq: Once | INTRAMUSCULAR | Status: AC | PRN
  Administered 2015-03-30: 100 mL via INTRAVENOUS

## 2015-08-18 MED FILL — ESTRADIOL 0.075 MG PATCH: 0.075 | 28 days supply | Qty: 8 | Fill #0

## 2015-08-18 MED FILL — PROGESTERONE 200 MG CAPSULE: 200 | 30 days supply | Qty: 30 | Fill #0

## 2015-08-18 MED FILL — METFORMIN HCL ER 500 MG TAB: 500 | 30 days supply | Qty: 60 | Fill #0

## 2015-08-24 MED FILL — ARMOUR THYROID 60 MG TABLET: 60 | 30 days supply | Qty: 30 | Fill #2

## 2015-08-26 MED FILL — clonazePAM 1 MG TABS: 1 | 90 days supply | Qty: 90 | Fill #0

## 2015-09-20 MED FILL — PROGESTERONE 200 MG CAPSULE: 200 | 30 days supply | Qty: 30 | Fill #1

## 2015-09-20 MED FILL — ESTRADIOL 0.075 MG PATCH: 0.075 | 28 days supply | Qty: 8 | Fill #1

## 2015-09-23 ENCOUNTER — Other Ambulatory Visit (HOSPITAL_BASED_OUTPATIENT_CLINIC_OR_DEPARTMENT_OTHER): Payer: Self-pay | Admitting: Nurse Practitioner

## 2015-09-23 DIAGNOSIS — Z780 Asymptomatic menopausal state: Secondary | ICD-10-CM

## 2015-09-23 DIAGNOSIS — Z1231 Encounter for screening mammogram for malignant neoplasm of breast: Secondary | ICD-10-CM

## 2015-09-23 DIAGNOSIS — Z0000 Encounter for general adult medical examination without abnormal findings: Secondary | ICD-10-CM

## 2015-10-06 MED FILL — AZITHROMYCIN 250 MG TABLET: 250 | 5 days supply | Qty: 6 | Fill #0

## 2015-10-15 DIAGNOSIS — R079 Chest pain, unspecified: Secondary | ICD-10-CM | POA: Diagnosis not present

## 2015-10-15 DIAGNOSIS — R5383 Other fatigue: Secondary | ICD-10-CM | POA: Diagnosis not present

## 2015-10-19 MED FILL — ESTRADIOL 0.075 MG PATCH: 0.075 | 28 days supply | Qty: 8 | Fill #2

## 2015-10-21 MED FILL — ARMOUR THYROID 60 MG TABLET: 60 | 30 days supply | Qty: 30 | Fill #0

## 2015-10-28 MED FILL — PROGESTERONE 200 MG CAPSULE: 200 | 30 days supply | Qty: 30 | Fill #2

## 2015-10-29 MED FILL — FLUTICASONE PROP 50 MCG SPR: 50 | 30 days supply | Qty: 16 | Fill #2

## 2015-11-01 ENCOUNTER — Ambulatory Visit (HOSPITAL_BASED_OUTPATIENT_CLINIC_OR_DEPARTMENT_OTHER): Payer: 59

## 2015-11-19 MED FILL — CHLORDIAZEPOXIDE 10 MG CAP: 10 | 90 days supply | Qty: 270 | Fill #0

## 2015-11-19 MED FILL — VYVANSE 50 MG CAPSULE: 50 | 90 days supply | Qty: 90 | Fill #0

## 2015-11-19 MED FILL — METFORMIN HCL ER 500 MG TAB: 500 | 30 days supply | Qty: 60 | Fill #1

## 2015-11-19 MED FILL — DEXTROAMPHETAMINE 10 MG TAB: 10 | 30 days supply | Qty: 60 | Fill #0

## 2015-11-22 ENCOUNTER — Other Ambulatory Visit (HOSPITAL_COMMUNITY): Payer: Self-pay | Admitting: Family Medicine

## 2015-11-22 ENCOUNTER — Telehealth (HOSPITAL_COMMUNITY): Payer: Self-pay | Admitting: *Deleted

## 2015-11-22 ENCOUNTER — Ambulatory Visit (HOSPITAL_BASED_OUTPATIENT_CLINIC_OR_DEPARTMENT_OTHER): Payer: 59

## 2015-11-22 ENCOUNTER — Other Ambulatory Visit (HOSPITAL_BASED_OUTPATIENT_CLINIC_OR_DEPARTMENT_OTHER): Payer: 59

## 2015-11-22 DIAGNOSIS — I340 Nonrheumatic mitral (valve) insufficiency: Secondary | ICD-10-CM

## 2015-11-23 ENCOUNTER — Ambulatory Visit (INDEPENDENT_AMBULATORY_CARE_PROVIDER_SITE_OTHER): Payer: 59 | Admitting: Licensed Clinical Social Worker

## 2015-11-23 DIAGNOSIS — F331 Major depressive disorder, recurrent, moderate: Secondary | ICD-10-CM | POA: Diagnosis not present

## 2015-11-29 ENCOUNTER — Ambulatory Visit (INDEPENDENT_AMBULATORY_CARE_PROVIDER_SITE_OTHER): Payer: 59 | Admitting: Licensed Clinical Social Worker

## 2015-11-29 DIAGNOSIS — F331 Major depressive disorder, recurrent, moderate: Secondary | ICD-10-CM | POA: Diagnosis not present

## 2015-12-01 MED FILL — ESTRADIOL 0.075 MG PATCH: 0.075 | 28 days supply | Qty: 8 | Fill #3

## 2015-12-01 MED FILL — BUPROPION HCL XL 150 MG TAB: 150 | 90 days supply | Qty: 90 | Fill #1 | Status: TO

## 2015-12-01 MED FILL — PROGESTERONE 200 MG CAPSULE: 200 | 30 days supply | Qty: 30 | Fill #3

## 2015-12-09 ENCOUNTER — Telehealth: Payer: Self-pay | Admitting: *Deleted

## 2015-12-09 NOTE — Telephone Encounter (Signed)
12/09/15  Cancel Rsn: Patient (Pt called tocacnel will not resch at this time//shanti )

## 2015-12-13 ENCOUNTER — Other Ambulatory Visit (HOSPITAL_COMMUNITY): Payer: 59

## 2015-12-13 DIAGNOSIS — E039 Hypothyroidism, unspecified: Secondary | ICD-10-CM | POA: Diagnosis not present

## 2015-12-13 DIAGNOSIS — R7989 Other specified abnormal findings of blood chemistry: Secondary | ICD-10-CM | POA: Diagnosis not present

## 2015-12-13 DIAGNOSIS — E28310 Symptomatic premature menopause: Secondary | ICD-10-CM | POA: Diagnosis not present

## 2015-12-13 MED FILL — clonazePAM 0.5 MG TABS: 0.5 | 30 days supply | Qty: 15 | Fill #0

## 2015-12-14 MED FILL — ARMOUR THYROID 60 MG TABLET: 60 | 30 days supply | Qty: 30 | Fill #0

## 2015-12-17 MED FILL — CONTRAVE ER 8-90 MG TABLET: 8-90 | 30 days supply | Qty: 120 | Fill #0

## 2015-12-27 ENCOUNTER — Ambulatory Visit
Admission: RE | Admit: 2015-12-27 | Discharge: 2015-12-27 | Disposition: A | Discharge status: Home / Self Care | Payer: 59 | Source: Ambulatory Visit | Attending: Nurse Practitioner | Admitting: Nurse Practitioner

## 2015-12-27 ENCOUNTER — Ambulatory Visit (INDEPENDENT_AMBULATORY_CARE_PROVIDER_SITE_OTHER): Payer: 59 | Admitting: Licensed Clinical Social Worker

## 2015-12-27 DIAGNOSIS — Z1231 Encounter for screening mammogram for malignant neoplasm of breast: Secondary | ICD-10-CM

## 2015-12-27 DIAGNOSIS — F3341 Major depressive disorder, recurrent, in partial remission: Secondary | ICD-10-CM | POA: Diagnosis not present

## 2015-12-28 ENCOUNTER — Other Ambulatory Visit: Payer: Self-pay | Admitting: Nurse Practitioner

## 2015-12-28 DIAGNOSIS — R928 Other abnormal and inconclusive findings on diagnostic imaging of breast: Secondary | ICD-10-CM

## 2015-12-29 MED FILL — CYANOCOBALAMIN 1,000 MCG/ML: 1000 | 90 days supply | Qty: 12 | Fill #0

## 2016-01-04 MED FILL — PROGESTERONE 200 MG CAPSULE: 200 | 30 days supply | Qty: 30 | Fill #4

## 2016-01-11 ENCOUNTER — Ambulatory Visit
Admission: RE | Admit: 2016-01-11 | Discharge: 2016-01-11 | Disposition: A | Discharge status: Home / Self Care | Payer: 59 | Source: Ambulatory Visit | Attending: Nurse Practitioner | Admitting: Nurse Practitioner

## 2016-01-11 DIAGNOSIS — N63 Unspecified lump in breast: Secondary | ICD-10-CM | POA: Diagnosis not present

## 2016-01-11 DIAGNOSIS — N6001 Solitary cyst of right breast: Secondary | ICD-10-CM | POA: Diagnosis not present

## 2016-01-11 DIAGNOSIS — R928 Other abnormal and inconclusive findings on diagnostic imaging of breast: Secondary | ICD-10-CM

## 2016-01-11 DIAGNOSIS — N6002 Solitary cyst of left breast: Secondary | ICD-10-CM | POA: Diagnosis not present

## 2016-01-12 DIAGNOSIS — E785 Hyperlipidemia, unspecified: Secondary | ICD-10-CM | POA: Diagnosis not present

## 2016-01-12 DIAGNOSIS — Z0000 Encounter for general adult medical examination without abnormal findings: Secondary | ICD-10-CM | POA: Diagnosis not present

## 2016-01-12 DIAGNOSIS — N6001 Solitary cyst of right breast: Secondary | ICD-10-CM | POA: Diagnosis not present

## 2016-01-17 ENCOUNTER — Other Ambulatory Visit (HOSPITAL_COMMUNITY): Payer: 59

## 2016-01-17 ENCOUNTER — Ambulatory Visit: Payer: Self-pay | Admitting: Licensed Clinical Social Worker

## 2016-01-18 MED FILL — ESTRADIOL 0.05 MG PATCH: 0.05 | 28 days supply | Qty: 8 | Fill #5

## 2016-01-18 MED FILL — ARMOUR THYROID 90 MG TABLET: 90 | 30 days supply | Qty: 30 | Fill #0 | Status: TO

## 2016-02-16 MED FILL — ESTRADIOL 0.05 MG PATCH: 0.05 | 28 days supply | Qty: 8 | Fill #0 | Status: TO

## 2016-03-07 MED FILL — PROGESTERONE 200 MG CAPSULE: 200 | 30 days supply | Qty: 30 | Fill #5 | Status: TO

## 2016-03-14 ENCOUNTER — Other Ambulatory Visit (HOSPITAL_COMMUNITY): Payer: Self-pay

## 2016-03-21 MED FILL — ESTRADIOL 0.05 MG PATCH: 0.05 | 28 days supply | Qty: 8 | Fill #0

## 2016-03-21 MED FILL — ARMOUR THYROID 90 MG TABLET: 90 | 30 days supply | Qty: 30 | Fill #0

## 2016-03-22 MED FILL — BUPROPION HCL XL 150 MG TAB: 150 | 90 days supply | Qty: 90 | Fill #0

## 2016-04-04 ENCOUNTER — Other Ambulatory Visit: Payer: Self-pay

## 2016-04-04 ENCOUNTER — Ambulatory Visit (HOSPITAL_COMMUNITY): Payer: 59 | Attending: Cardiology

## 2016-04-04 DIAGNOSIS — E785 Hyperlipidemia, unspecified: Secondary | ICD-10-CM | POA: Insufficient documentation

## 2016-04-04 DIAGNOSIS — I517 Cardiomegaly: Secondary | ICD-10-CM | POA: Insufficient documentation

## 2016-04-04 DIAGNOSIS — Z87891 Personal history of nicotine dependence: Secondary | ICD-10-CM | POA: Diagnosis not present

## 2016-04-04 DIAGNOSIS — I340 Nonrheumatic mitral (valve) insufficiency: Secondary | ICD-10-CM | POA: Insufficient documentation

## 2016-04-04 DIAGNOSIS — Z8249 Family history of ischemic heart disease and other diseases of the circulatory system: Secondary | ICD-10-CM | POA: Diagnosis not present

## 2016-04-04 MED FILL — FLUTICASONE PROP 50 MCG SPR: 50 | 30 days supply | Qty: 16 | Fill #0

## 2016-04-10 MED FILL — ESTRADIOL 0.05 MG PATCH: 0.05 | 28 days supply | Qty: 8 | Fill #1

## 2016-04-11 MED FILL — PROGESTERONE 200 MG CAPSULE: 200 | 30 days supply | Qty: 30 | Fill #0

## 2016-05-31 DIAGNOSIS — J069 Acute upper respiratory infection, unspecified: Secondary | ICD-10-CM | POA: Diagnosis not present

## 2016-08-10 MED FILL — FLUTICASONE PROP 50 MCG SPR: 50 | 30 days supply | Qty: 16 | Fill #1

## 2016-08-10 MED FILL — ESTRADIOL 0.05 MG PATCH: 0.05 | 28 days supply | Qty: 8 | Fill #2

## 2016-08-10 MED FILL — ARMOUR THYROID 90 MG TABLET: 90 | 30 days supply | Qty: 30 | Fill #1

## 2016-08-10 MED FILL — PROGESTERONE 200 MG CAPSULE: 200 | 30 days supply | Qty: 30 | Fill #1

## 2016-08-15 DIAGNOSIS — R635 Abnormal weight gain: Secondary | ICD-10-CM | POA: Diagnosis not present

## 2016-08-15 DIAGNOSIS — M25559 Pain in unspecified hip: Secondary | ICD-10-CM | POA: Diagnosis not present

## 2016-08-15 DIAGNOSIS — F322 Major depressive disorder, single episode, severe without psychotic features: Secondary | ICD-10-CM | POA: Diagnosis not present

## 2016-08-15 DIAGNOSIS — M8588 Other specified disorders of bone density and structure, other site: Secondary | ICD-10-CM | POA: Diagnosis not present

## 2016-08-15 DIAGNOSIS — E2839 Other primary ovarian failure: Secondary | ICD-10-CM | POA: Diagnosis not present

## 2016-08-16 MED FILL — DEXTROAMPHETAMINE 10 MG TAB: 10 | 30 days supply | Qty: 60 | Fill #0

## 2016-08-18 MED FILL — VYVANSE 60 MG CAPSULE: 60 | 30 days supply | Qty: 30 | Fill #0

## 2016-08-23 DIAGNOSIS — M5441 Lumbago with sciatica, right side: Secondary | ICD-10-CM | POA: Diagnosis not present

## 2016-08-23 MED FILL — CELECOXIB 200 MG CAPSULE: 200 | 30 days supply | Qty: 30 | Fill #0

## 2016-09-11 MED FILL — ESTRADIOL 0.05 MG PATCH: 0.05 | 28 days supply | Qty: 8 | Fill #3

## 2016-09-11 MED FILL — ARMOUR THYROID 90 MG TABLET: 90 | 30 days supply | Qty: 30 | Fill #2

## 2016-09-11 MED FILL — PROGESTERONE 200 MG CAPSULE: 200 | 30 days supply | Qty: 30 | Fill #2

## 2016-09-25 MED FILL — PANTOPRAZOLE SOD DR 40 MG T: 40 | 14 days supply | Qty: 14 | Fill #0

## 2016-09-26 MED FILL — AMOXICILLIN 500 MG CAPSULE: 500 | 6 days supply | Qty: 28 | Fill #0

## 2016-09-26 MED FILL — traZODone HCL 50 MG TABS: 50 | 30 days supply | Qty: 90 | Fill #0

## 2016-09-26 MED FILL — SERTRALINE HCL 25 MG TABLET: 25 | 30 days supply | Qty: 30 | Fill #0

## 2016-09-26 MED FILL — FLUoxetine HCL 10 MG CAPS: 10 | 30 days supply | Qty: 30 | Fill #0

## 2016-09-28 DIAGNOSIS — M9905 Segmental and somatic dysfunction of pelvic region: Secondary | ICD-10-CM | POA: Diagnosis not present

## 2016-09-28 DIAGNOSIS — M62838 Other muscle spasm: Secondary | ICD-10-CM | POA: Diagnosis not present

## 2016-09-28 DIAGNOSIS — M461 Sacroiliitis, not elsewhere classified: Secondary | ICD-10-CM | POA: Diagnosis not present

## 2016-09-28 DIAGNOSIS — M9903 Segmental and somatic dysfunction of lumbar region: Secondary | ICD-10-CM | POA: Diagnosis not present

## 2016-09-28 DIAGNOSIS — H5213 Myopia, bilateral: Secondary | ICD-10-CM | POA: Diagnosis not present

## 2016-10-04 MED FILL — CHLORDIAZEPOXIDE 10 MG CAP: 10 | 90 days supply | Qty: 270 | Fill #0

## 2016-10-11 DIAGNOSIS — Z87891 Personal history of nicotine dependence: Secondary | ICD-10-CM | POA: Diagnosis not present

## 2016-10-11 DIAGNOSIS — J111 Influenza due to unidentified influenza virus with other respiratory manifestations: Secondary | ICD-10-CM | POA: Diagnosis not present

## 2016-10-19 DIAGNOSIS — J0191 Acute recurrent sinusitis, unspecified: Secondary | ICD-10-CM | POA: Diagnosis not present

## 2016-10-19 DIAGNOSIS — R5383 Other fatigue: Secondary | ICD-10-CM | POA: Diagnosis not present

## 2016-10-27 DIAGNOSIS — N951 Menopausal and female climacteric states: Secondary | ICD-10-CM | POA: Diagnosis not present

## 2016-10-27 DIAGNOSIS — R635 Abnormal weight gain: Secondary | ICD-10-CM | POA: Diagnosis not present

## 2016-10-30 DIAGNOSIS — M8588 Other specified disorders of bone density and structure, other site: Secondary | ICD-10-CM | POA: Diagnosis not present

## 2016-10-30 DIAGNOSIS — E2839 Other primary ovarian failure: Secondary | ICD-10-CM | POA: Diagnosis not present

## 2016-10-31 DIAGNOSIS — E039 Hypothyroidism, unspecified: Secondary | ICD-10-CM | POA: Diagnosis not present

## 2016-10-31 DIAGNOSIS — E663 Overweight: Secondary | ICD-10-CM | POA: Diagnosis not present

## 2016-10-31 DIAGNOSIS — E538 Deficiency of other specified B group vitamins: Secondary | ICD-10-CM | POA: Diagnosis not present

## 2016-10-31 DIAGNOSIS — E782 Mixed hyperlipidemia: Secondary | ICD-10-CM | POA: Diagnosis not present

## 2016-10-31 DIAGNOSIS — N951 Menopausal and female climacteric states: Secondary | ICD-10-CM | POA: Diagnosis not present

## 2016-11-06 DIAGNOSIS — E782 Mixed hyperlipidemia: Secondary | ICD-10-CM | POA: Diagnosis not present

## 2016-11-06 DIAGNOSIS — E039 Hypothyroidism, unspecified: Secondary | ICD-10-CM | POA: Diagnosis not present

## 2016-11-06 DIAGNOSIS — E663 Overweight: Secondary | ICD-10-CM | POA: Diagnosis not present

## 2016-11-13 DIAGNOSIS — E039 Hypothyroidism, unspecified: Secondary | ICD-10-CM | POA: Diagnosis not present

## 2016-11-13 DIAGNOSIS — E782 Mixed hyperlipidemia: Secondary | ICD-10-CM | POA: Diagnosis not present

## 2016-11-13 DIAGNOSIS — E663 Overweight: Secondary | ICD-10-CM | POA: Diagnosis not present

## 2016-12-04 DIAGNOSIS — R151 Fecal smearing: Secondary | ICD-10-CM | POA: Diagnosis not present

## 2016-12-04 DIAGNOSIS — L290 Pruritus ani: Secondary | ICD-10-CM | POA: Diagnosis not present

## 2016-12-06 MED FILL — traZODone HCL 50 MG TABS: 50 | 30 days supply | Qty: 90 | Fill #1

## 2016-12-13 DIAGNOSIS — Z7689 Persons encountering health services in other specified circumstances: Secondary | ICD-10-CM | POA: Diagnosis not present

## 2016-12-19 DIAGNOSIS — E039 Hypothyroidism, unspecified: Secondary | ICD-10-CM | POA: Diagnosis not present

## 2016-12-19 DIAGNOSIS — N951 Menopausal and female climacteric states: Secondary | ICD-10-CM | POA: Diagnosis not present

## 2017-01-15 ENCOUNTER — Encounter: Payer: Self-pay | Admitting: Podiatry

## 2017-01-15 ENCOUNTER — Ambulatory Visit (INDEPENDENT_AMBULATORY_CARE_PROVIDER_SITE_OTHER): Payer: 59

## 2017-01-15 ENCOUNTER — Ambulatory Visit (INDEPENDENT_AMBULATORY_CARE_PROVIDER_SITE_OTHER): Payer: 59 | Admitting: Podiatry

## 2017-01-15 VITALS — BP 130/73 | HR 58 | Resp 16

## 2017-01-15 DIAGNOSIS — M2010 Hallux valgus (acquired), unspecified foot: Secondary | ICD-10-CM

## 2017-01-15 DIAGNOSIS — M205X2 Other deformities of toe(s) (acquired), left foot: Secondary | ICD-10-CM

## 2017-01-15 DIAGNOSIS — M779 Enthesopathy, unspecified: Secondary | ICD-10-CM

## 2017-01-15 DIAGNOSIS — L600 Ingrowing nail: Secondary | ICD-10-CM

## 2017-01-15 MED FILL — traZODone HCL 50 MG TABS: 50 | 30 days supply | Qty: 90 | Fill #2

## 2017-01-15 NOTE — Patient Instructions (Signed)

## 2017-01-15 NOTE — Progress Notes (Signed)
Subjective:    Patient ID: Megan Greer, female   DOB: 61 y.o.   MRN: [id number]   HPI patient states that she's get significant bunion deformity left that's painful and that the right one was fixed in 2011 and at times gives her problems and she is complaining of ingrown toenail deformity left hallux medial border    Review of Systems  All other systems reviewed and are negative.       Objective:  Physical Exam  Cardiovascular: Intact distal pulses.   Musculoskeletal: Normal range of motion.  Neurological: She is alert.  Skin: Skin is warm.  Nursing note and vitals reviewed.  neurovascular status intact muscle strength was adequate range of motion was within normal limits with patient's first MPJ left showing limited motion and structural bunion deformity along with moderate structural bunion deformity right that had had a proximal osteotomy performed and number of years ago. 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 Patient wants this done but needs to wait till future and today I did because of chronic foot pain I scanned the patient for custom orthotic devices and also went ahead and discussed correction of the ingrown toenail left hallux which she wants done to wait several weeks for. Will reappoint to pickup orthotics in the next several weeks  X-rays indicate spur formation around the first MPJ left with structural bunion deformity and elevation of the intermetatarsal angle of approximate 15 bilateral with screws  in the first metatarsocuneiform joint right and moderate depression of the arch

## 2017-01-15 NOTE — Progress Notes (Signed)
   Subjective:    Patient ID: Megan Greer, female    DOB: [birth year], 62 y.o.   MRN: [id number]  HPI  Chief Complaint  Patient presents with  . Foot Pain    Bilateral; bunions; pt stated, "Had bunion surgery done on the Right foot in 2011 in [place], but the bunion is coming back; left foot hurts more than the left-pain is intermittent"      Review of Systems  Eyes: Positive for redness.  Musculoskeletal: Positive for arthralgias.  All other systems reviewed and are negative.      Objective:   Physical Exam        Assessment & Plan:

## 2017-01-26 DIAGNOSIS — H04123 Dry eye syndrome of bilateral lacrimal glands: Secondary | ICD-10-CM | POA: Diagnosis not present

## 2017-02-05 ENCOUNTER — Ambulatory Visit (INDEPENDENT_AMBULATORY_CARE_PROVIDER_SITE_OTHER): Payer: 59 | Admitting: Podiatry

## 2017-02-05 DIAGNOSIS — M2010 Hallux valgus (acquired), unspecified foot: Secondary | ICD-10-CM | POA: Diagnosis not present

## 2017-02-05 DIAGNOSIS — M205X2 Other deformities of toe(s) (acquired), left foot: Secondary | ICD-10-CM

## 2017-02-05 NOTE — Patient Instructions (Addendum)
WEARING INSTRUCTIONS FOR ORTHOTICS  Don't expect to be comfortable wearing your orthotic devices for the first time.  Like eyeglasses, you may be aware of them as time passes, they will not be uncomfortable and you will enjoy wearing them.  FOLLOW THESE INSTRUCTIONS EXACTLY!  1. Wear your orthotic devices for:       Not more than 1 hour the first day.       Not more than 2 hours the second day.       Not more than 3 hours the third day and so on.        Or wear them for as long as they feel comfortable.       If you experience discomfort in your feet or legs take them out.  When feet & legs feel       better, put them back in.  You do need to be consistent and wear them a little        everyday. 2.   If at any time the orthotic devices become acutely uncomfortable before the       time for that particular day, STOP WEARING THEM. 3.   On the next day, do not increase the wearing time. 4.   Subsequently, increase the wearing time by 15-30 minutes only if comfortable to do       so. 5.   You will be seen by your doctor about 2-4 weeks after you receive your orthotic       devices, at which time you will probably be wearing your devices comfortably        for about 8 hours or more a day. 6.   Some patients occasionally report mild aches or discomfort in other parts of the of       body such as the knees, hips or back after 3 or 4 consecutive hours of wear.  If this       is the case with you, do not extend your wearing time.  Instead, cut it back an hour or       two.  In all likelihood, these symptoms will disappear in a short period of time as your       body posture realigns itself and functions more efficiently. 7.   It is possible that your orthotic device may require some small changes or adjustment       to improve their function or make them more comfortable.   This is usually not done       before one to three months have elapsed.  These adjustments are made in        accordance  with the changed position your feet are assuming as a result of       improved biomechanical function. 8.   In women's shoes, it's not unusual for your heel to slip out of the shoe, particularly if       they are step-in-shoes.  If this is the case, try other shoes or other styles.  Try to       purchase shoes which have deeper heal seats or higher heel counters. 9.   Squeaking of orthotics devices in the shoes is due to the movement of the devices       when they are functioning normally.  To eliminate squeaking, simply dust some       baby powder into your shoes before inserting the devices.  If this does not work,          apply soap or wax to the edges of the orthotic devices or put a tissue into the shoes. 10. It is important that you follow these directions explicitly.  Failure to do so will simply       prolong the adjustment period or create problems which are easily avoided.  It makes       no difference if you are wearing your orthotic devices for only a few hours after        several months, so long as you are wearing them comfortably for those hours. 11. If you have any questions or complaints, contact our office.  We have no way of       knowing about your problems unless you tell us.  If we do not hear from you, we will       assume that you are proceeding well.   Pre-Operative Instructions  Congratulations, you have decided to take an important step to improving your quality of life.  You can be assured that the doctors of [facility] will be with you every step of the way.  2. Plan to be at the surgery center/hospital at least 1 (one) hour prior to your scheduled time unless otherwise directed by the surgical center/hospital staff.  You must have a responsible adult accompany you, remain during the surgery and drive you home.  Make sure you have directions to the surgical center/hospital and know how to get there on time. 3. For hospital based surgery you will need to  obtain a history and physical form from your family physician within 1 month prior to the date of surgery- we will give you a form for you primary physician.  4. We make every effort to accommodate the date you request for surgery.  There are however, times where surgery dates or times have to be moved.  We will contact you as soon as possible if a change in schedule is required.   5. No Aspirin/Ibuprofen for one week before surgery.  If you are on aspirin, any non-steroidal anti-inflammatory medications (Mobic, Aleve, Ibuprofen) you should stop taking it 7 days prior to your surgery.  You make take Tylenol  For pain prior to surgery.  6. Medications- If you are taking daily heart and blood pressure medications, seizure, reflux, allergy, asthma, anxiety, pain or diabetes medications, make sure the surgery center/hospital is aware before the day of surgery so they may notify you which medications to take or avoid the day of surgery. 7. No food or drink after midnight the night before surgery unless directed otherwise by surgical center/hospital staff. 8. No alcoholic beverages 24 hours prior to surgery.  No smoking 24 hours prior to or 24 hours after surgery. 9. Wear loose pants or shorts- loose enough to fit over bandages, boots, and casts. 10. No slip on shoes, sneakers are best. 11. Bring your boot with you to the surgery center/hospital.  Also bring crutches or a walker if your physician has prescribed it for you.  If you do not have this equipment, it will be provided for you after surgery. 12. If you have not been contracted by the surgery center/hospital by the day before your surgery, call to confirm the date and time of your surgery. 66. Leave-time from work may vary depending on the type of surgery you have.  Appropriate arrangements should be made prior to surgery with your employer. 14. Prescriptions will be provided immediately following surgery by your doctor.  Have these filled  as soon as  possible after surgery and take the medication as directed. 15. Remove nail polish on the operative foot. 16. Wash the night before surgery.  The night before surgery wash the foot and leg well with the antibacterial soap provided and water paying special attention to beneath the toenails and in between the toes.  Rinse thoroughly with water and dry well with a towel.  Perform this wash unless told not to do so by your physician.  Enclosed: 1 Ice pack (please put in freezer the night before surgery)   1 Hibiclens skin cleaner   Pre-op Instructions  If you have any questions regarding the instructions, do not hesitate to call our office.  [place]: [place], [place] [address] [place]: [address] [place], [place] [address] [phone]  [place]: [address], [place] [address] [phone]   Dr. Ila Mcgill DPM, Dr. Celesta Gentile DPM, Dr. Lanelle Bal DPM, Dr. Landis Martins DPM   Bunionectomy  A bunionectomy is a surgical procedure to remove a bunion. A bunion is a visible bump of bone on the inside of your foot where your big toe meets the rest of your foot. A bunion can develop when pressure turns this bone (first metatarsal) toward the other toes. Shoes that are too tight are the most common cause of bunions. Bunions can also be caused by diseases, such as arthritis and polio. You may need a bunionectomy if your bunion is very large and painful or it affects your ability to walk. Tell a health care provider about:  Any allergies you have.  All medicines you are taking, including vitamins, herbs, eye drops, creams, and over-the-counter medicines.  Any problems you or family members have had with anesthetic medicines.  Any blood disorders you have.  Any surgeries you have had.  Any medical conditions you have. What are the risks? Generally, this is a safe procedure. However, problems may occur, including:  Infection.  Pain.  Nerve  damage.  Bleeding or blood clots.  Reactions to medicines.  Numbness, stiffness, or arthritis in your toe.  Foot problems that continue even after the procedure.  What happens before the procedure?  Ask your health care provider about: ? Changing or stopping your regular medicines. This is especially important if you are taking diabetes medicines or blood thinners. ? Taking medicines such as aspirin and ibuprofen. These medicines can thin your blood. Do not take these medicines before your procedure if your health care provider instructs you not to.  Do not drink alcohol before the procedure as directed by your health care provider.  Do not use tobacco products, including cigarettes, chewing tobacco, or electronic cigarettes, before the procedure as directed by your health care provider. If you need help quitting, ask your health care provider.  Ask your health care provider what kind of medicine you will be given during your procedure. A bunionectomy may be done using one of these: ? A medicine that numbs the area (local anesthetic). ? A medicine that makes you go to sleep (general anesthetic). If you will be given general anesthetic, do not eat or drink anything after midnight on the night before the procedure or as directed by your health care provider. What happens during the procedure?  An IV tube may be inserted into a vein.  You will be given local anesthetic or general anesthetic.  The surgeon will make a cut (incision) over the enlarged area at the first joint of the  big toe. The surgeon will remove the bunion.  You may have more than one incision if any of the bones in your big toe need to be moved. A bone itself may need to be cut.  Sometimes the tissues around the big toe may also need to be cut then tightened or loosened to reposition the toe.  Screws or other hardware may be used to keep your foot in thecorrect position.  The incision will be closed with stitches  (sutures) and covered with adhesive strips or another type of bandage (dressing). What happens after the procedure?  You may spend some time in a recovery area.  Your blood pressure, heart rate, breathing rate, and blood oxygen level will be monitored often until the medicines you were given have worn off. This information is not intended to replace advice given to you by your health care provider. Make sure you discuss any questions you have with your health care provider. Document Released: 07/14/2005 Document Revised: 01/06/2016 Document Reviewed: 03/18/2014 Elsevier Interactive Patient Education  2017 [employer].

## 2017-02-05 NOTE — Progress Notes (Signed)
Subjective:    Patient ID: Megan Greer, female   DOB: 61 y.o.   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 Patient wants surgery understanding the risk associated with this and signs consent form after extensive discussion and understands recovery can take from 6 months to one year. I did dispense air fracture walker today with all instructions on usage and I want her to get used to it prior to procedure

## 2017-02-09 ENCOUNTER — Telehealth: Payer: Self-pay | Admitting: *Deleted

## 2017-02-09 NOTE — Telephone Encounter (Signed)
"  I'm a patient of Dr. Paulla Dolly.  I was in his office the other day and he discussed foot surgery with me.  I'm not going to be able to do the surgery until the beginning of September.  I had made plans to visit my son in August.  They gave me the packet of information and the thing to clean your foot and the boot.  They said to call you to schedule the surgery.  So that's what I'm doing today.  If you would, call me back."  I attempted to return her call.  I left her a message that I would call her back on Monday.

## 2017-02-12 NOTE — Telephone Encounter (Signed)
"  I'm returning your call.  I tried to reach you on Friday.  Actually I'm calling to schedule my surgery for September.  Please give me a call back."

## 2017-02-13 NOTE — Telephone Encounter (Signed)
I attempted to return her call.  I left her another message that I was trying to call her back.  I asked her to give me a call back.

## 2017-02-13 NOTE — Telephone Encounter (Signed)
[name], [name], [name]"  [name], [name]  Yes, you will be okay.  "[name], [name]"  [name]"  [name]

## 2017-02-16 MED FILL — CHLORDIAZEPOXIDE 10 MG CAP: 10 | 90 days supply | Qty: 270 | Fill #0

## 2017-02-16 MED FILL — FLUTICASONE PROP 50 MCG SPR: 50 | 30 days supply | Qty: 16 | Fill #2

## 2017-02-20 DIAGNOSIS — J329 Chronic sinusitis, unspecified: Secondary | ICD-10-CM | POA: Diagnosis not present

## 2017-02-20 DIAGNOSIS — R490 Dysphonia: Secondary | ICD-10-CM | POA: Diagnosis not present

## 2017-02-20 DIAGNOSIS — K219 Gastro-esophageal reflux disease without esophagitis: Secondary | ICD-10-CM | POA: Diagnosis not present

## 2017-02-20 MED FILL — PANTOPRAZOLE SOD DR 40 MG T: 40 | 30 days supply | Qty: 30 | Fill #0

## 2017-02-20 MED FILL — AZITHROMYCIN 250 MG TAB: 250 | 5 days supply | Qty: 6 | Fill #0

## 2017-02-20 MED FILL — D-AMPHETAMINE ER 10 MG CAP: 10 | 90 days supply | Qty: 90 | Fill #0

## 2017-02-27 ENCOUNTER — Telehealth: Payer: Self-pay | Admitting: *Deleted

## 2017-02-27 NOTE — Telephone Encounter (Signed)
"  I tried to initiate my FMLA.  They said they have to get authorization from you all.  I'm not sure what all that means.  My surgery is scheduled for September 4."  When it gets closer to the surgery date, you need to contact [employer]'s HR department and get the forms to be filled out by [place].  You will bring the forms to [place],  "How soon before should I bring them?"  You can bring them about two weeks before.

## 2017-03-06 MED FILL — traZODone HCL 50 MG TABS: 50 | 90 days supply | Qty: 270 | Fill #0

## 2017-03-14 DIAGNOSIS — N951 Menopausal and female climacteric states: Secondary | ICD-10-CM | POA: Diagnosis not present

## 2017-03-20 ENCOUNTER — Other Ambulatory Visit (HOSPITAL_COMMUNITY)
Admission: RE | Admit: 2017-03-20 | Discharge: 2017-03-20 | Disposition: A | Discharge status: Home / Self Care | Payer: 59 | Source: Ambulatory Visit | Attending: Family Medicine | Admitting: Family Medicine

## 2017-03-20 ENCOUNTER — Other Ambulatory Visit: Payer: Self-pay | Admitting: Family Medicine

## 2017-03-20 DIAGNOSIS — Z0000 Encounter for general adult medical examination without abnormal findings: Secondary | ICD-10-CM | POA: Diagnosis not present

## 2017-03-20 DIAGNOSIS — Z6829 Body mass index (BMI) 29.0-29.9, adult: Secondary | ICD-10-CM | POA: Diagnosis not present

## 2017-03-20 DIAGNOSIS — Z01411 Encounter for gynecological examination (general) (routine) with abnormal findings: Secondary | ICD-10-CM | POA: Insufficient documentation

## 2017-03-20 DIAGNOSIS — Z79899 Other long term (current) drug therapy: Secondary | ICD-10-CM | POA: Diagnosis not present

## 2017-03-22 LAB — CYTOLOGY - PAP: DIAGNOSIS: NEGATIVE

## 2017-04-17 ENCOUNTER — Encounter: Payer: Self-pay | Admitting: Podiatry

## 2017-04-17 ENCOUNTER — Telehealth: Payer: Self-pay | Admitting: *Deleted

## 2017-04-17 DIAGNOSIS — M21612 Bunion of left foot: Secondary | ICD-10-CM | POA: Diagnosis not present

## 2017-04-17 DIAGNOSIS — K219 Gastro-esophageal reflux disease without esophagitis: Secondary | ICD-10-CM | POA: Diagnosis not present

## 2017-04-17 DIAGNOSIS — M2012 Hallux valgus (acquired), left foot: Secondary | ICD-10-CM | POA: Diagnosis not present

## 2017-04-17 MED FILL — ONDANSETRON HCL 4 MG TABLET: 4 | 5 days supply | Qty: 20 | Fill #0

## 2017-04-17 MED FILL — PANTOPRAZOLE SOD DR 40 MG T: 40 | 30 days supply | Qty: 30 | Fill #1

## 2017-04-17 MED FILL — OXYCODONE-APAP 10-325 MG TA: 10-325 | 3 days supply | Qty: 25 | Fill #0

## 2017-04-17 NOTE — Telephone Encounter (Signed)
Pt states had surgery today and wanted to know when she could begin to use the knee scooter. I told pt, to begin using anytime she needed to get up and move around, but remember that time on the knee scooter is considered time with her knee below her heart, and that increased swelling and possible pain. Pt asked if she needed to begin ice, and I told her yes and she could put it behind her knee. Pt states she did not have knee surgery and I told her I was aware, the ice cooled the blood that circulated down to the foot and helped with pain and to decrease swelling. Pt asked for the appt 1st POV, I told her 04/25/2017 at 10:15am.

## 2017-04-18 ENCOUNTER — Telehealth: Payer: Self-pay | Admitting: Podiatry

## 2017-04-18 DIAGNOSIS — Z9889 Other specified postprocedural states: Secondary | ICD-10-CM

## 2017-04-18 NOTE — Addendum Note (Signed)
Addended by: Harriett Sine D on: 04/18/2017 01:43 PM   Modules accepted: Orders

## 2017-04-18 NOTE — Telephone Encounter (Signed)
Pt asked if she could take ibuprofen and I told her it was on the post op sheet, that she could take as the OTC directed. Pt stated she wanted a knee scooter and I told her I would order it for her.

## 2017-04-18 NOTE — Telephone Encounter (Signed)
I had surgery by Dr. Paulla Dolly yesterday and I have a few questions. Also, can he write a prescription for a knee scooter and send that to [facility]. I called my insurance company and they are in my network. My phone number [phone].

## 2017-04-19 DIAGNOSIS — M21619 Bunion of unspecified foot: Secondary | ICD-10-CM | POA: Diagnosis not present

## 2017-04-20 NOTE — Progress Notes (Signed)
DOS 04/17/2017 Austin Bunionectomy LT

## 2017-04-25 ENCOUNTER — Encounter: Payer: Self-pay | Admitting: Podiatry

## 2017-04-25 ENCOUNTER — Ambulatory Visit (INDEPENDENT_AMBULATORY_CARE_PROVIDER_SITE_OTHER): Payer: 59 | Admitting: Podiatry

## 2017-04-25 ENCOUNTER — Ambulatory Visit (INDEPENDENT_AMBULATORY_CARE_PROVIDER_SITE_OTHER): Payer: 59

## 2017-04-25 DIAGNOSIS — M2012 Hallux valgus (acquired), left foot: Secondary | ICD-10-CM | POA: Diagnosis not present

## 2017-04-25 NOTE — Progress Notes (Signed)
Subjective:    Patient ID: Megan Greer, female   DOB: 61 y.o.   MRN: [id number]   HPI patient states doing well with the left foot with minimal discomfort    ROS      Objective:  Physical Exam neurovascular status intact negative Homans sign noted with patient's left foot doing well with wound edges well coapted hallux in rectus position and range of motion good     Assessment:    Doing well post osteotomy first metatarsal left     Plan:    X-ray reviewed with patient and went ahead reapplied sterile dressing dispensed surgical shoe continued complete immobilization elevation and reappoint 3 weeks or earlier if needed  X-rays indicate the osteotomy is healing well with pins in place and no indication of movement

## 2017-05-04 ENCOUNTER — Telehealth: Payer: Self-pay | Admitting: Podiatry

## 2017-05-04 NOTE — Telephone Encounter (Signed)
I had surgery done by Dr. Paulla Dolly a little over two weeks ago. I was told I had sutures that absorb. However, I have this one stitch at the very end or beginning of the incision and it looks like a very fine fishing line. That one doesn't seem to be absorbing, as a matter of fact it looks like its pointed out and it looks like these little filaments that looks like he cut it off. Will that one also absorb? I just wanted to speak to the nurse about it. Please call me back at [phone]. Thank you.

## 2017-05-04 NOTE — Telephone Encounter (Signed)
Left message that on occasion the dissolvable sutures will get dry and not dissolve, but if put a little neosporin bandaid over it would moisten enough to dissolve at the skin line and come out, but if redness or pain developed then contact our office for an appt.

## 2017-05-09 MED FILL — PROMETHAZINE 25 MG TABLET: 25 | 1 days supply | Qty: 10 | Fill #0

## 2017-05-09 MED FILL — OXYCODON-ACETAMINOPHEN 2.5-: 2.5-325 | 1 days supply | Qty: 5 | Fill #0

## 2017-05-11 DIAGNOSIS — S0502XA Injury of conjunctiva and corneal abrasion without foreign body, left eye, initial encounter: Secondary | ICD-10-CM | POA: Diagnosis not present

## 2017-05-11 MED FILL — NEO/POLYMYXIN/DEXAMETH DROP: 3.5-10000-0 | 30 days supply | Qty: 5 | Fill #0

## 2017-05-11 MED FILL — HYDROCODON-APAP 7.5-325: 7.5-325 | 3 days supply | Qty: 15 | Fill #0

## 2017-05-17 ENCOUNTER — Ambulatory Visit (INDEPENDENT_AMBULATORY_CARE_PROVIDER_SITE_OTHER): Payer: 59

## 2017-05-17 ENCOUNTER — Telehealth: Payer: Self-pay | Admitting: *Deleted

## 2017-05-17 ENCOUNTER — Encounter: Payer: Self-pay | Admitting: Podiatry

## 2017-05-17 ENCOUNTER — Ambulatory Visit (INDEPENDENT_AMBULATORY_CARE_PROVIDER_SITE_OTHER): Payer: Self-pay | Admitting: Podiatry

## 2017-05-17 VITALS — BP 126/70 | HR 53 | Resp 16

## 2017-05-17 DIAGNOSIS — M2012 Hallux valgus (acquired), left foot: Secondary | ICD-10-CM | POA: Diagnosis not present

## 2017-05-17 NOTE — Telephone Encounter (Signed)
Pt presents for scheduled appt, states she is feeling weak and nauseous. Pt was escorted from lobby by Marcial Pacas - Systems analyst and pt did appear pale. I took pt to exam room, and raised feet and slightly lowered back of chair, because she was drinking water. Pt states had breakfast 8:00am today, but today is the 1st time she has been out of the house since facial surgery, and she may have done too much. BP 140/79, P 55. Unable to get glucose - glucometer showed Error twice, but pt states she does feel better sitting. Pt's color was less pale. Pt requested to have back lowered a little more, and as I left exam room to get Dr. Mellody Drown assistant Marvell Fuller came into the room to get x-rays.

## 2017-05-17 NOTE — Progress Notes (Signed)
Subjective:    Patient ID: Megan Greer, female   DOB: 61 y.o.   MRN: [id number]   HPI patient states doing great with her left foot    ROS      Objective:  Physical Exam neurovascular status intact negative Homans sign noted with well coapted incision site left first metatarsal with good alignment and good range of motion with no crepitus of the joint surface     Assessment:    Doing well post osteotomy first metatarsal left     Plan:    Advised on continued compression and dispensed ankle compression stocking gradual return soft shoes and continued range of motion exercises  X-rays indicate osteotomy is healing well pins in place no signs of movement

## 2017-05-28 ENCOUNTER — Telehealth: Payer: Self-pay | Admitting: Podiatry

## 2017-05-28 MED FILL — FLUTICASONE PROP 50 MCG SPR: 50 | 30 days supply | Qty: 16 | Fill #0

## 2017-05-28 NOTE — Telephone Encounter (Signed)
Pt states she is having sharp pain near the surgery site. I told pt I felt it may be a pin or screw shifting and offered an appt to see Dr. Paulla Dolly on his return. I also instructed pt to go back in to the surgical shoe. Pt stated she was not certain she wanted to wait for Dr. Paulla Dolly, but would like to have the x-rays performed and ready for Dr. Paulla Dolly at his return. I transferred pt to schedulers.

## 2017-05-28 NOTE — Telephone Encounter (Signed)
I had foot surgery by Dr. Paulla Dolly on 04 September. I've seen him several times and he has already released me to go back to work and to be able to wear a tennis shoe. The reason I'm calling is because along the incision line farthest from my big toe, its feels like its rubbing against something, like something is under the skin which is causing a piercing sensation. I don't know if this could be where I'm starting to walk again. He did insert 1 or 2 screws/pins. I have not had any of these problems the whole time since my surgery. You can reach me at [phone].

## 2017-05-29 ENCOUNTER — Encounter: Payer: Self-pay | Admitting: Podiatry

## 2017-05-29 ENCOUNTER — Ambulatory Visit (INDEPENDENT_AMBULATORY_CARE_PROVIDER_SITE_OTHER): Payer: 59

## 2017-05-29 ENCOUNTER — Ambulatory Visit (INDEPENDENT_AMBULATORY_CARE_PROVIDER_SITE_OTHER): Payer: 59 | Admitting: Podiatry

## 2017-05-29 DIAGNOSIS — M2012 Hallux valgus (acquired), left foot: Secondary | ICD-10-CM

## 2017-05-29 DIAGNOSIS — Z969 Presence of functional implant, unspecified: Secondary | ICD-10-CM

## 2017-05-29 MED FILL — BUPROPION HCL XL 150 MG TAB: 150 | 90 days supply | Qty: 90 | Fill #0

## 2017-05-31 ENCOUNTER — Telehealth: Payer: Self-pay | Admitting: Podiatry

## 2017-05-31 NOTE — Progress Notes (Signed)
Subjective: Megan Greer presents the office today status post Megan Greer bunionectomy in the left foot, 04/17/2017 with Dr. Paulla Dolly. She states that she feels a sharp pain and she points along 1 specific area on the bunion site where she feels the discomfort. She's concerned that she is feeling the wire that is in her foot. She is scheduled to go back to work towards the end of this month and she wants of the area checked before she goes back to work. She denies any recent injury or trauma. She's been back into wearing a regular shoe. She gets some occasional swelling but this is improving as well. Denies any redness or warmth to her feet. She has no other concerns today. Denies any systemic complaints such as fevers, chills, nausea, vomiting. No acute changes since last appointment, and no other complaints at this time.   Objective: AAO x3, NAD DP/PT pulses palpable bilaterally, CRT less than 3 seconds Incision from the surgeries well healed. Range of motion of the first MPJ is intact without any pain. There is 1 specific area of tenderness and this is along the area of the palpable hardware, wire. There is no other areas of discomfort on the surgical site. Otherwise she is doing well. No open lesions or pre-ulcerative lesions.  No pain with calf compression, swelling, warmth, erythema  Assessment: Prominent hardware left foot  Plan: -All treatment options discussed with the patient including all alternatives, risks, complications.  -X-rays were obtained and reviewed. Does appear to be healing of the osteotomy site and adequate position of the hardware. -Ultimately which is any need to have this wire removed and she can already feel it. However I think at this point we need to go ahead and leave the wire in place to allow for healing but in the near future likely removal of the wire. I will have her follow with Dr. Paulla Dolly the next couple weeks for reevaluation. -Patient encouraged to call the office with  any questions, concerns, change in symptoms.   Celesta Gentile, DPM

## 2017-05-31 NOTE — Telephone Encounter (Signed)
Hi Valery. I had called a few days ago and got in to see Dr. Mee Hives I think it was. He x-rayed my foot and indeed the pin is either coming out, or he really didn't give any clear indication what it was doing. He said it would have to be moved but that right now its too soon to remove it. He suggested I make an appointment with Dr. Paulla Dolly next week which I did since he is not in this week. Well know, I am wearing the little pad but whenever I turn my foot a certain way its excruciating pain and the knot is getting bigger, like I think the pin is coming out into my skin. Now I don't know what to do, I don't know who to turn to. I think this is something that needs attention. I don't want to let this pin go through my foot. The feeling I have is like something is poking me from the inside. I've been trying to wear a tennis shoe but I could not wear that so I'm back in the surgical shoe. I am not even able to see Dr. Paulla Dolly until Wednesday, and I'm supposed to go back to work on 30 October. I'm at a loss of what to do. Please call me back at [phone]. Thank you.

## 2017-05-31 NOTE — Telephone Encounter (Addendum)
A. Horton - Call Center/Scheduler transferred pt to me. Pt states the pin feels like it is backing out more, she is having more pain, even in the surgical shoe with the padding recommended by Dr. Mee Hives. Pt states she understands that the pin is not ready to come out, but the area where the pin is out has gotten bigger. I told pt to go back into the tall surgical shoe, that it would decrease the movement of the accessory muscle and tendons within the foot and leg, which should also decrease the pain. Dr. Jacqualyn Posey agreed and said get pt in to see Dr. Paulla Dolly on Wednesday. I informed pt of Dr. Leigh Aurora recommendations. Pt asked if what she had been doing as far as activities should she continue, she has been shopping and gardening, not shoveling, if she should continue. I told pt she should treat herself like and an athlete and only perform ADL. I told pt to ice the site and protect the skin from the ice, and if tolerated take an OTC antiinflammatory as the package directed for inflammation and pain.Pt states understanding.

## 2017-06-06 ENCOUNTER — Ambulatory Visit (INDEPENDENT_AMBULATORY_CARE_PROVIDER_SITE_OTHER): Payer: 59 | Admitting: Podiatry

## 2017-06-06 ENCOUNTER — Ambulatory Visit (INDEPENDENT_AMBULATORY_CARE_PROVIDER_SITE_OTHER): Payer: 59

## 2017-06-06 DIAGNOSIS — Z969 Presence of functional implant, unspecified: Secondary | ICD-10-CM

## 2017-06-06 DIAGNOSIS — M779 Enthesopathy, unspecified: Secondary | ICD-10-CM

## 2017-06-06 DIAGNOSIS — M2012 Hallux valgus (acquired), left foot: Secondary | ICD-10-CM | POA: Diagnosis not present

## 2017-06-06 DIAGNOSIS — M205X2 Other deformities of toe(s) (acquired), left foot: Secondary | ICD-10-CM | POA: Diagnosis not present

## 2017-06-06 MED ORDER — TRIAMCINOLONE ACETONIDE 10 MG/ML IJ SUSP
10.0000 mg | Freq: Once | INTRAMUSCULAR | Status: AC
  Administered 2017-06-06: 10 mg

## 2017-06-08 NOTE — Progress Notes (Signed)
Subjective:    Patient ID: Megan Greer, female   DOB: 61 y.o.   MRN: [id number]   HPI patient states doing well with the left bunion correction    ROS      Objective:  Physical Exam neurovascular status intact negative Homans sign was noted with patient's left structural bunion site doing well with good alignment noted minimal discomfort and good range of motion     Assessment:   Doing well post osteotomy left      Plan:   Reviewed x-rays and advised on continued range of motion continued elevation as needed and gradual increase in shoe gear usage  X-ray indicates osteotomy is healing well with good alignment noted and positional component

## 2017-06-27 ENCOUNTER — Other Ambulatory Visit: Payer: 59

## 2017-07-04 ENCOUNTER — Encounter: Payer: Self-pay | Admitting: Podiatry

## 2017-07-04 ENCOUNTER — Ambulatory Visit (INDEPENDENT_AMBULATORY_CARE_PROVIDER_SITE_OTHER): Payer: 59 | Admitting: Podiatry

## 2017-07-04 ENCOUNTER — Ambulatory Visit (INDEPENDENT_AMBULATORY_CARE_PROVIDER_SITE_OTHER): Payer: 59

## 2017-07-04 ENCOUNTER — Telehealth: Payer: Self-pay | Admitting: *Deleted

## 2017-07-04 DIAGNOSIS — M2012 Hallux valgus (acquired), left foot: Secondary | ICD-10-CM

## 2017-07-04 DIAGNOSIS — M205X2 Other deformities of toe(s) (acquired), left foot: Secondary | ICD-10-CM

## 2017-07-04 DIAGNOSIS — Z472 Encounter for removal of internal fixation device: Secondary | ICD-10-CM

## 2017-07-04 NOTE — Telephone Encounter (Signed)
"  I was there today.  I am calling to schedule my surgery.  I had to go home and look at my calendar.  I work 12 hour shifts but I have certain days off.  He said he wanted to do it in the next couple of weeks."  He can do it on December 10.  "That date will be fine."  Be here at 7:45 am.  "Can he do it on Tuesday, December 11?"  No, he cannot do it then, that is his surgery day at the surgical center.  Okay, just put me down for the 10th, I'll be there."

## 2017-07-06 NOTE — Progress Notes (Signed)
Subjective:    Patient ID: Megan Greer, female   DOB: 61 y.o.   MRN: [id number]   HPI patient states overall doing well but something touches the top of the foot indicate irritated especially in this one spot    ROS      Objective:  Physical Exam neurovascular status intact with patient's foot overall healing well with good alignment good range of motion there is a proximal irritation around where the pin was implanted     Assessment:  Probability for pin irritation in the proximal portion of the incision site shaft of the metatarsal       Plan:    H&P x-rays reviewed and at this point I've recommended that we go ahead and remove the pin. Patient wants procedure and I explained procedure and risk and allowed her to sign consent form and patient is scheduled to have this done in the office. Encouraged to call with any questions prior to procedure and other than that we'll continue with localized range of motion exercises and gradual return to soft shoe gear  X-rays indicate prominence of the proximal pin first metatarsal with a distal pin doing well

## 2017-07-16 MED FILL — traZODone HCL 50 MG TABS: 50 | 90 days supply | Qty: 270 | Fill #1

## 2017-07-20 ENCOUNTER — Telehealth: Payer: Self-pay | Admitting: *Deleted

## 2017-07-20 DIAGNOSIS — F900 Attention-deficit hyperactivity disorder, predominantly inattentive type: Secondary | ICD-10-CM | POA: Diagnosis not present

## 2017-07-20 DIAGNOSIS — F331 Major depressive disorder, recurrent, moderate: Secondary | ICD-10-CM | POA: Diagnosis not present

## 2017-07-20 NOTE — Telephone Encounter (Signed)
"  I'm a patient of Dr. Paulla Dolly.  He was going to take a pin out of my foot from my bunionectomy on this coming Monday but because of the snow, they are closing.  The young lady called to reschedule my surgery.  I wasn't sure if I'm going to need a couple of days off.  I had arranged it so that if I had my surgery on Monday, I was going to have Tuesday and Wednesday off.  I don't have three days off very often.  I had made arranged it specifically for that time.  I didn't know if you thought I'd need some time off.  I work as a Marine scientist.  I didn't know if you knew if I needed some time off and whether I have enough time to reschedule it.  If you think I can go back to work with my little shoe I have then maybe I can go back to work sooner.  If I'm going to need a couple of days off.  I'm going to have to schedule way out because I have to plan that way ahead in advance and take some time off.  Please call me back and let me know.  Thank you."  I'm returning your call.  You do not need to take any time off, just take the day of surgery off and elevate as much as possible.  He's not doing any bone cutting so it should be okay.  "Okay, when can I schedule it?"  He can do it on December 19.  "I'm off that day, let's do it then."  I'll get it scheduled.

## 2017-07-23 ENCOUNTER — Ambulatory Visit: Payer: 59 | Admitting: Podiatry

## 2017-08-01 ENCOUNTER — Encounter: Payer: Self-pay | Admitting: Podiatry

## 2017-08-01 ENCOUNTER — Ambulatory Visit (INDEPENDENT_AMBULATORY_CARE_PROVIDER_SITE_OTHER): Payer: 59 | Admitting: Podiatry

## 2017-08-01 VITALS — BP 134/74 | HR 59 | Temp 97.3°F | Resp 16

## 2017-08-01 DIAGNOSIS — Z472 Encounter for removal of internal fixation device: Secondary | ICD-10-CM | POA: Diagnosis not present

## 2017-08-01 MED ORDER — HYDROCODONE-IBUPROFEN 5-200 MG PO TABS: 1.0000 | ORAL_TABLET | Freq: Three times a day (TID) | ORAL | 0 refills | Status: DC | PRN

## 2017-08-01 MED FILL — HYDROCOD-IBU 7.5-200 TAB: 7.5-200 | 10 days supply | Qty: 30 | Fill #0

## 2017-08-01 NOTE — Progress Notes (Signed)
Subjective:   Patient ID: Anselm Jungling, female   DOB: 61 y.o.   MRN: [id number]   HPI Patient presents for removal of pain from the left first metatarsal stating it really been bothering her and making shoe gear difficult   ROS      Objective:  Physical Exam  Neurovascular status intact with patient found to have a inflammation around a prominent pin position proximal first metatarsal left     Assessment:  Abnormal pin position first metatarsal left     Plan:  Patient is brought to the OR and is injected with 60 mg like Marcaine mixture and I then went ahead and did sterile prep to the left foot and then inflated the tourniquet to 50 mmHg.  Follow procedure was performed.  I made a small incision over the offending pin first metatarsal left.  I went ahead and I freed the subcutaneous tissue with hemostasis being acquired as necessary further taking the capsule making a small capsular incision.  I went ahead and identified the head of the band from the first metatarsal and remove the pin entirety and noted no other abnormal tissue.  Flushed the wound and sutured with 5-0 nylon and applied sterile dressing.  Tourniquet was released and capillary fill to be needed to all digits on the foot the patient tolerated procedure well and left the OR in satisfactory condition

## 2017-08-15 ENCOUNTER — Ambulatory Visit (INDEPENDENT_AMBULATORY_CARE_PROVIDER_SITE_OTHER): Payer: 59

## 2017-08-15 ENCOUNTER — Ambulatory Visit (INDEPENDENT_AMBULATORY_CARE_PROVIDER_SITE_OTHER): Payer: 59 | Admitting: Podiatry

## 2017-08-15 ENCOUNTER — Encounter: Payer: Self-pay | Admitting: Podiatry

## 2017-08-15 DIAGNOSIS — M2012 Hallux valgus (acquired), left foot: Secondary | ICD-10-CM | POA: Diagnosis not present

## 2017-08-15 DIAGNOSIS — Z472 Encounter for removal of internal fixation device: Secondary | ICD-10-CM

## 2017-08-15 DIAGNOSIS — K219 Gastro-esophageal reflux disease without esophagitis: Secondary | ICD-10-CM | POA: Diagnosis not present

## 2017-08-15 DIAGNOSIS — M533 Sacrococcygeal disorders, not elsewhere classified: Secondary | ICD-10-CM | POA: Diagnosis not present

## 2017-08-15 DIAGNOSIS — M25551 Pain in right hip: Secondary | ICD-10-CM | POA: Diagnosis not present

## 2017-08-15 MED FILL — CELECOXIB 200 MG CAPSULE: 200 | 30 days supply | Qty: 30 | Fill #1

## 2017-08-15 MED FILL — D-AMPHETAMINE ER 10 MG CAP: 10 | 90 days supply | Qty: 90 | Fill #0

## 2017-08-15 MED FILL — PANTOPRAZOLE SOD DR 40 MG T: 40 | 30 days supply | Qty: 30 | Fill #0

## 2017-08-15 MED FILL — CHLORDIAZEPOXIDE 10 MG CAP: 10 | 90 days supply | Qty: 270 | Fill #0

## 2017-08-15 MED FILL — BUPROPION HCL XL 300 MG TAB: 300 | 90 days supply | Qty: 90 | Fill #0

## 2017-08-15 NOTE — Progress Notes (Signed)
Subjective:   Patient ID: Megan Greer, female   DOB: 62 y.o.   MRN: [id number]   HPI Patient states feeling fine with no discomfort   ROS      Objective:  Physical Exam  Neurovascular status intact with patient's left foot healing well with good alignment noted wound edges coapted well     Assessment:  Doing well from pin removal left     Plan:  Reviewed conditions stitches removed dressing applied and instructed on being careful with it for the next few days and reappoint as needed  X-rays indicate that the toe in the area is healing well with satisfactory resection and removal of pin

## 2017-08-24 ENCOUNTER — Other Ambulatory Visit: Payer: Self-pay | Admitting: Family Medicine

## 2017-08-24 ENCOUNTER — Encounter: Payer: Self-pay | Admitting: Physical Therapy

## 2017-08-24 ENCOUNTER — Ambulatory Visit (HOSPITAL_BASED_OUTPATIENT_CLINIC_OR_DEPARTMENT_OTHER)
Admission: RE | Admit: 2017-08-24 | Discharge: 2017-08-24 | Disposition: A | Discharge status: Home / Self Care | Payer: 59 | Source: Ambulatory Visit | Attending: Family Medicine | Admitting: Family Medicine

## 2017-08-24 ENCOUNTER — Ambulatory Visit: Payer: 59 | Attending: Family Medicine | Admitting: Physical Therapy

## 2017-08-24 DIAGNOSIS — M25551 Pain in right hip: Secondary | ICD-10-CM | POA: Diagnosis not present

## 2017-08-24 DIAGNOSIS — M533 Sacrococcygeal disorders, not elsewhere classified: Secondary | ICD-10-CM

## 2017-08-24 DIAGNOSIS — M545 Low back pain: Secondary | ICD-10-CM | POA: Diagnosis not present

## 2017-08-24 DIAGNOSIS — M160 Bilateral primary osteoarthritis of hip: Secondary | ICD-10-CM | POA: Diagnosis not present

## 2017-08-24 DIAGNOSIS — M25552 Pain in left hip: Secondary | ICD-10-CM | POA: Insufficient documentation

## 2017-08-24 DIAGNOSIS — M25651 Stiffness of right hip, not elsewhere classified: Secondary | ICD-10-CM | POA: Insufficient documentation

## 2017-08-24 DIAGNOSIS — M6281 Muscle weakness (generalized): Secondary | ICD-10-CM | POA: Insufficient documentation

## 2017-08-24 DIAGNOSIS — G8929 Other chronic pain: Secondary | ICD-10-CM | POA: Diagnosis not present

## 2017-08-24 NOTE — Patient Instructions (Signed)
        Lie face down, hands close to chest. Press trunk up, arching back. Keep neck long, shoulders down. Tighten buttocks to protect lower back. Hold ____ seconds. Repeat ____ times. Do ____ sessions per day.  Copyright  VHI. All rights reserved.   Copyright  VHI. All rights reserved.   Copyright  VHI. All rights reserved.  Backward Bend (Standing)   Arch backward to make hollow of back deeper. Hold ____ seconds. Repeat ____ times per set. Do ____ sets per session. Do ____ sessions per day.  http://orth.exer.us/178   Copyright  VHI. All rights reserved.     Ruben Im PT [facility] [address] [place], [place] [address] Phone # [phone] Fax [phone]

## 2017-08-24 NOTE — Therapy (Signed)
[facility] [address] [place], [place], [address] Phone: [phone]   Fax:  [phone]  Physical Therapy Evaluation  Patient Details  Name: Megan Greer MRN: [id number] Date of Birth: 1955/11/12 Referring Provider: Dr. Jonny Ruiz   Encounter Date: 08/24/2017  PT End of Session - 08/24/17 1207    Visit Number  1    Date for PT Re-Evaluation  10/19/17    Authorization Type  UMR    PT Start Time  1100    PT Stop Time  1150    PT Time Calculation (min)  50 min    Activity Tolerance  Patient tolerated treatment well       Past Medical History:  Diagnosis Date  . Anxiety   . GERD (gastroesophageal reflux disease)   . Heart murmur   . Hyperlipidemia   . Insomnia   . Mitral valve regurgitation    mild  . Sinusitis     Past Surgical History:  Procedure Laterality Date  . BUNIONECTOMY     Right foot   . DILATION AND CURETTAGE OF UTERUS      There were no vitals filed for this visit.   Subjective Assessment - 08/24/17 1103    Subjective  Works as a Marine scientist in the ED.  2 years ago started having bilateral hip pain and took Advil to manage.  1 year ago it worsened and started on Celebrex and the pain improved.  Stopped the Celebrex.  The pain returned recently.  Difficult to walk upon rising.  Slight limp.  While resting from foot surgery, it got better.    Now just right lateral buttock and across the whole low back.  Now takes Celebrex daily.       Pertinent History  Sept foot surgery ;  reports 30# weight gain in the last few years;  used to do [employer] but stopped b/c of foot surgery;  hx of depression and worsening recently not going out as much    Limitations  Walking;House hold activities    How long can you sit comfortably?  as long as I want    How long can you walk comfortably?  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  Yes    Pain Score  -- with movement 7/10    Pain Location  Buttocks across back    Pain Orientation  Right    Pain Type  Chronic pain    Pain Radiating Towards  right buttock    Pain Onset  More than a month ago    Pain Frequency  Intermittent    Aggravating Factors   upon rising,  on my feet, moving    Pain Relieving Factors  sitting         [facility] PT Assessment - 08/24/17 0001      Assessment   Medical Diagnosis  SI pain    Referring Provider  Dr. Jonny Ruiz    Onset Date/Surgical Date  -- worsened 1 month ago    Next MD Visit  2 weeks    Prior Therapy  chiro helped in the past      Precautions   Precautions  None      Restrictions   Weight Bearing  Restrictions  No      Balance Screen   Has the patient fallen in the past 6 months  No    Has the patient had a decrease in activity level because of a fear of falling?   No    Is the patient reluctant to leave their home because of a fear of falling?   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    Leisure  TV, stay home; used to do yardwork      Observation/Other Assessments   Focus on Therapeutic Outcomes (FOTO)   47% limitation      Posture/Postural Control   Posture/Postural Control  Postural limitations    Postural Limitations  Decreased lumbar lordosis    Posture Comments  right iliac crest higher;  difficulty SLS on right unable to hold 2 sec      AROM   Right Hip External Rotation   40    Right Hip Internal Rotation   10    Left Hip  External Rotation   45    Left Hip Internal Rotation   15    Lumbar Flexion  65    Lumbar Extension  15    Lumbar - Right Side Bend  22    Lumbar - Left Side Bend  28      Strength   Right Hip Flexion  5/5    Right Hip Extension  4+/5    Right Hip External Rotation   4/5    Right Hip ABduction  3+/5    Left Hip Flexion  5/5    Left Hip Extension  4+/5    Left Hip ABduction  4/5    Lumbar Flexion  3+/5 difficulty activating transverse abdominus    Lumbar Extension  3+/5 difficulty holding bird dog quadruped position      Flexibility   Hamstrings  decreased length on right 70 degrees    Piriformis  decreased length on right      Palpation   Palpation comment  minimal tenderness right piriformis and gluteals      FABER test   findings  Negative    Side  -- "feels good"      Slump test   Findings  Negative      Prone Knee Bend Test   Findings  Negative      Straight Leg Raise   Findings  Negative      Pelvic Dictraction   Findings  Negative      Pelvic Compression   Findings  Negative      Sacral Compression   Findings  Negative      Ambulation/Gait   Gait Comments  decreased stance time on right especially upon rising             Objective measurements completed on examination: See above findings.              PT Education - 08/24/17 1206    Education provided  Yes  Education Details  prone press ups;  supine abdominal brace; dry needling basic info    Person(s) Educated  Patient    Methods  Explanation;Handout;Demonstration    Comprehension  Verbalized understanding;Returned demonstration       PT Short Term Goals - 08/24/17 1217      PT SHORT TERM GOAL #1   Title  The patient will demonstrate understanding of basic self care strategies and initial HEP    Time  4    Period  Weeks    Status  New    Target Date  09/21/17      PT SHORT TERM GOAL #2   Title  The patient will report a 30% reduction in pain with rising from the chair  and with being on her feet at work    Time  4    Period  Weeks    Status  New      PT [place] #3   Title  The patient will have right hamstring length to 80 degrees and hip internal rotation to 15 degreees needed for normal mobility at work and home    Time  4    Period  Weeks    Status  New      PT SHORT TERM GOAL #4   Title  The patient will be able to walk 10-15 min (outside of work) with min complaint of pain    Time  4    Period  Weeks    Status  New        PT Long Term Goals - 08/24/17 1222      PT LONG TERM GOAL #1   Title  The patient will be independent in safe, self progression of HEP and a basic gym program (possible return to [employer])    Time  8    Period  Weeks    Status  New    Target Date  10/19/17      PT LONG TERM GOAL #2   Title  The patient will report a 60% improvement in hip and back pain with rising and initial walking    Time  8    Period  Weeks    Status  New      PT LONG TERM GOAL #3   Title  The 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    Time  8    Period  Weeks    Status  New      PT LONG TERM GOAL #4   Title  The patient will be able to single leg stand on right at least 8 sec    Time  8    Period  Weeks    Status  New      PT LONG TERM GOAL #5   Title  Lumbar extension ROM to 25 degrees and bilateral sidebending to 33 degrees 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   She reports her depression has worsened b/c of this problem.  She has stopped exercising and has decreased her activity level significantly.  She also has been recovering from foot surgery.  Decreased right HS and piriformis muscle lengths.  Decreased lumbar ROM in all planes.  Weakness with  right hip abduction and decreased activation of transverse abdominus muscles.  She would benefit from PT to address these deficits.      History and Personal Factors relevant to plan of care:  depression;  recent left foot surgery    Clinical Presentation  Evolving    Clinical Presentation due to:  worsening of symptoms over time.      Clinical Decision Making  Moderate    Rehab Potential  Good    PT Frequency  2x / week    PT Duration  8 weeks    PT Treatment/Interventions  ADLs/Self Care Home Management;Iontophoresis 4mg /ml Dexamethasone;Neuromuscular re-education;Dry needling;Cryotherapy;Electrical Stimulation;Ultrasound;Moist Heat;Therapeutic activities;Therapeutic exercise;Patient/family education;Manual techniques;Taping    PT Next Visit Plan  Add HS and piriformis stretch to HEP;  DN lumbar multifidi, QL, gluteals and piriformis;  neuroscience of pain education;  right hip mobs;  Nu-STep;   core strength;  gluteus medius strengthening       Patient will benefit from skilled therapeutic intervention in order to improve the following deficits and impairments:  Pain, Difficulty walking, Decreased strength, Decreased range of motion, Impaired flexibility  Visit Diagnosis: Chronic bilateral low back pain without sciatica - Plan: PT plan of care cert/re-cert  Pain in right hip - Plan: PT plan of care cert/re-cert  Muscle weakness (generalized) - Plan: PT plan of care cert/re-cert  Stiffness of right hip, not elsewhere classified - Plan: PT plan of care cert/re-cert     Problem List Patient Active  Problem List   Diagnosis Date Noted  . Abdominal pain, chronic, right lower quadrant 08/13/2013  . Unspecified hypothyroidism 05/06/2013  . Transaminitis 01/18/2012  . Anxiety and depression 11/17/2011  . Preventative health care 11/17/2011  . Osteopenia 11/17/2011  . Chronic insomnia 11/17/2011  . Hyperlipidemia 11/17/2011  . Gastroenteritis 11/17/2011   Ruben Im, PT 08/24/17 12:33 PM Phone: [phone] Fax: [phone]  Alvera Singh 08/24/2017, 12:33 PM  [facility] [address] [place], [place], [address] Phone: [phone]   Fax:  [phone]  Name: SADDIE SANDEEN MRN: [id number] Date of Birth: [birth year]

## 2017-08-31 ENCOUNTER — Ambulatory Visit: Payer: 59 | Admitting: Physical Therapy

## 2017-08-31 DIAGNOSIS — G8929 Other chronic pain: Secondary | ICD-10-CM | POA: Diagnosis not present

## 2017-08-31 DIAGNOSIS — M545 Low back pain, unspecified: Secondary | ICD-10-CM

## 2017-08-31 DIAGNOSIS — M25651 Stiffness of right hip, not elsewhere classified: Secondary | ICD-10-CM

## 2017-08-31 DIAGNOSIS — M6281 Muscle weakness (generalized): Secondary | ICD-10-CM

## 2017-08-31 DIAGNOSIS — M25551 Pain in right hip: Secondary | ICD-10-CM | POA: Diagnosis not present

## 2017-08-31 NOTE — Therapy (Signed)
[facility] [address] [place], [place], [address] Phone: [phone]   Fax:  [phone]  Physical Therapy Treatment  Patient Details  Name: Megan Greer MRN: [id number] Date of Birth: 1956-02-28 Referring Provider: Dr. Jonny Ruiz   Encounter Date: 08/31/2017  PT End of Session - 08/31/17 0922    Visit Number  2    Date for PT Re-Evaluation  10/19/17    Authorization Type  UMR    PT Start Time  0845    PT Stop Time  0933    PT Time Calculation (min)  48 min    Activity Tolerance  Patient tolerated treatment well       Past Medical History:  Diagnosis Date  . Anxiety   . GERD (gastroesophageal reflux disease)   . Heart murmur   . Hyperlipidemia   . Insomnia   . Mitral valve regurgitation    mild  . Sinusitis     Past Surgical History:  Procedure Laterality Date  . BUNIONECTOMY     Right foot   . DILATION AND CURETTAGE OF UTERUS      There were no vitals filed for this visit.  Subjective Assessment - 08/31/17 0848    Subjective  I didn't practice the exercises as much as I wanted to.    Some pain today.  I didn't take the Celebrex yesterday to see how I would do.  Hurt a lot on the right last night.  Hardly anything on the left.      Currently in Pain?  Yes    Pain Score  2     Pain Location  Buttocks    Pain Orientation  Right    Pain Type  Chronic pain    Aggravating Factors   walking                      [facility] Adult PT Treatment/Exercise - 08/31/17 0001      Exercises   Exercises  Knee/Hip      Knee/Hip Exercises: Stretches   Active Hamstring Stretch  Right    Active Hamstring Stretch Limitations  supine with strap    Piriformis Stretch  Right;3 reps;20 seconds      Moist Heat Therapy   Number Minutes Moist Heat  15 Minutes    Moist Heat Location  Lumbar Spine;Hip      Electrical Stimulation   Electrical Stimulation Location  lumbar and right buttock    Electrical Stimulation Action  IFC    Electrical Stimulation Parameters  9 ma 15 min    Electrical Stimulation Goals  Pain      Manual Therapy   Joint Mobilization  right hip long axis distraction, inferior mobs, AP in internal rotation grade 3 3x 20 sec each    Soft tissue mobilization  bil lumbar paraspinals, gluteals and piriformis    Muscle Energy Technique  right contract relax piriformis 3x 5 sec hold       Trigger Point Dry Needling - 08/31/17 1217    Consent Given?  Yes    Education Handout Provided  Yes    Muscles Treated Lower Body  Gluteus minimus;Gluteus maximus;Piriformis bil lumbar multifidi    Gluteus Maximus Response  Twitch response elicited;Palpable increased muscle length    Gluteus Minimus Response  Twitch response elicited;Palpable increased muscle length    Piriformis Response  Twitch response elicited;Palpable increased muscle length      Right side.  PT Education - 08/31/17 [phone]    Education provided  Yes    Education Details  piriformis stretches, hamstring    Person(s) Educated  Patient    Methods  Explanation;Demonstration;Handout    Comprehension  Verbalized understanding;Returned demonstration       PT Short Term Goals - 08/24/17 1217      PT SHORT TERM GOAL #1   Title  The patient will demonstrate understanding of basic self care strategies and initial HEP    Time  4    Period  Weeks    Status  New    Target Date  09/21/17      PT SHORT TERM GOAL #2   Title  The patient will report a 30% reduction in pain with rising from the chair and with being on her feet at work    Time  4    Period  Weeks    Status  New      PT [place] #3   Title  The patient will have right hamstring length to 80 degrees and hip internal rotation to 15 degreees needed for normal mobility at work and home    Time  4    Period  Weeks    Status  New      PT SHORT TERM GOAL #4   Title  The patient will be able to walk 10-15 min (outside of work) with  min complaint of pain    Time  4    Period  Weeks    Status  New        PT Long Term Goals - 08/24/17 1222      PT LONG TERM GOAL #1   Title  The patient will be independent in safe, self progression of HEP and a basic gym program (possible return to [employer])    Time  8    Period  Weeks    Status  New    Target Date  10/19/17      PT LONG TERM GOAL #2   Title  The patient will report a 60% improvement in hip and back pain with rising and initial walking    Time  8    Period  Weeks    Status  New      PT LONG TERM GOAL #3   Title  The 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    Time  8    Period  Weeks    Status  New      PT LONG TERM GOAL #4   Title  The patient will be able to single leg stand on right at least 8 sec    Time  8    Period  Weeks    Status  New      PT LONG TERM GOAL #5   Title  Lumbar extension ROM to 25 degrees and bilateral sidebending to 33 degrees 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  Numerous tender points especially in right piriformis but improved soft tissue length following treatment session.      Rehab Potential  Good    PT Frequency  2x / week    PT Duration  8 weeks    PT Treatment/Interventions  ADLs/Self Care Home Management;Iontophoresis 4mg /ml Dexamethasone;Neuromuscular re-education;Dry needling;Cryotherapy;Electrical Stimulation;Ultrasound;Moist Heat;Therapeutic activities;Therapeutic  exercise;Patient/family education;Manual techniques;Taping    PT Next Visit Plan  assess response to  DN #1  lumbar multifidi, QL, gluteals and piriformis;   right hip mobs;  Nu-STep;   core strength;  gluteus medius strengthening;  add clams to HEP       Patient will benefit from skilled therapeutic intervention in order to improve the following deficits and impairments:  Pain, Difficulty walking, Decreased strength, Decreased range of motion, Impaired flexibility  Visit Diagnosis: Chronic bilateral low back pain without sciatica  Pain in right hip  Muscle weakness (generalized)  Stiffness of right hip, not elsewhere classified     Problem List Patient Active Problem List   Diagnosis Date Noted  . Abdominal pain, chronic, right lower quadrant 08/13/2013  . Unspecified hypothyroidism 05/06/2013  . Transaminitis 01/18/2012  . Anxiety and depression 11/17/2011  . Preventative health care 11/17/2011  . Osteopenia 11/17/2011  . Chronic insomnia 11/17/2011  . Hyperlipidemia 11/17/2011  . Gastroenteritis 11/17/2011   Ruben Im, PT 08/31/17 12:23 PM Phone: [phone] Fax: [phone]  Alvera Singh 08/31/2017, 12:21 PM  [facility] [address], [place], [place], [address] Phone: [phone]   Fax:  [phone]  Name: Megan Greer MRN: [id number] Date of Birth: 1955/10/03

## 2017-08-31 NOTE — Patient Instructions (Signed)
Piriformis (Supine)  Cross legs, right on top. Gently pull other knee toward chest until stretch is felt in buttock/hip of top leg. Hold __20__ seconds. Repeat ___3_ times per set. Do ___1_ sets per session. Do ___1-2_ sessions per day.  Hip Stretch  Put right ankle over left knee. Let right knee fall downward, but keep ankle in place. Feel the stretch in hip. May push down gently with hand to feel stretch. Hold __20__ seconds while counting out loud. Repeat with other leg. Repeat __3__ times. Do _1-2___ sessions per day.  Stretching: Piriformis   Cross left leg over other thigh and place elbow over outside of knee. Gently stretch buttock muscles by pushing bent knee across body. Hold __20__ seconds. Repeat __3__ times per set. Do ___1_ sets per session. Do __1-2__ sessions per day.  Stretching: Piriformis (Supine)  Pull right knee toward opposite shoulder. Hold __20__ seconds. Relax. Repeat _3___ times per set. Do ___1_ sets per session. Do __1-2_ sessions per day.  Piriformis Stretch, Kneeling Modified [place]  From hands and knees, slide one leg backward, turn bent other leg out slightly to side. Rest weight on outside of bent leg. If extended hip is elevated place a blanket or towel underneath to relax hip. With back straight, lay trunk forward over bent leg. Hold _20__ seconds.  Repeat _30__ times per session. Do _1-2__ sessions per day.  Copyright  VHI. All rights reserved.    HAMSTRING STRETCH WITH TOWEL  While lying down on your back, hook a towel or strap under  your foot and draw up your leg until a stretch is felt under your leg. calf area.  Keep your knee in a straightened position during the stretch.  Hold 20 seconds, perform 3 repetitions.  1-2 times a day.      Trigger Point Dry Needling  . What is Trigger Point Dry Needling (DN)? o DN is a physical therapy technique used to treat muscle pain and dysfunction. Specifically, DN helps deactivate muscle trigger  points (muscle knots).  o A thin filiform needle is used to penetrate the skin and stimulate the underlying trigger point. The goal is for a local twitch response (LTR) to occur and for the trigger point to relax. No medication of any kind is injected during the procedure.   . What Does Trigger Point Dry Needling Feel Like?  o The procedure feels different for each individual patient. Some patients report that they do not actually feel the needle enter the skin and overall the process is not painful. Very mild bleeding may occur. However, many patients feel a deep cramping in the muscle in which the needle was inserted. This is the local twitch response.   Marland Kitchen How Will I feel after the treatment? o Soreness is normal, and the onset of soreness may not occur for a few hours. Typically this soreness does not last longer than two days.  o Bruising is uncommon, however; ice can be used to decrease any possible bruising.  o In rare cases feeling tired or nauseous after the treatment is normal. In addition, your symptoms may get worse before they get better, this period will typically not last longer than 24 hours.   . What Can I do After My Treatment? o Increase your hydration by drinking more water for the next 24 hours. o You may place ice or heat on the areas treated that have become sore, however, do not use heat on inflamed or bruised areas. Heat often brings more  relief post needling. o You can continue your regular activities, but vigorous activity is not recommended initially after the treatment for 24 hours. o DN is best combined with other physical therapy such as strengthening, stretching, and other therapies.   [place] [address] [place], [place] [address] Phone # [phone] Fax [phone]        [facility] [address] [place], [place] [address] Phone # [phone] Fax [phone]

## 2017-09-04 ENCOUNTER — Ambulatory Visit: Payer: 59 | Admitting: Physical Therapy

## 2017-09-04 ENCOUNTER — Encounter: Payer: Self-pay | Admitting: Physical Therapy

## 2017-09-04 DIAGNOSIS — M25551 Pain in right hip: Secondary | ICD-10-CM

## 2017-09-04 DIAGNOSIS — G8929 Other chronic pain: Secondary | ICD-10-CM

## 2017-09-04 DIAGNOSIS — M545 Low back pain, unspecified: Secondary | ICD-10-CM

## 2017-09-04 DIAGNOSIS — M25651 Stiffness of right hip, not elsewhere classified: Secondary | ICD-10-CM

## 2017-09-04 DIAGNOSIS — M6281 Muscle weakness (generalized): Secondary | ICD-10-CM | POA: Diagnosis not present

## 2017-09-04 NOTE — Therapy (Signed)
[facility] [address] [place], [place], [address] Phone: [phone]   Fax:  [phone]  Physical Therapy Treatment  Patient Details  Name: Megan Greer MRN: [id number] Date of Birth: Jan 03, 1956 Referring Provider: Dr. Jonny Ruiz   Encounter Date: 09/04/2017  PT End of Session - 09/04/17 1016    Visit Number  3    Date for PT Re-Evaluation  10/19/17    Authorization Type  UMR    PT Start Time  0930    PT Stop Time  1020    PT Time Calculation (min)  50 min    Activity Tolerance  Patient tolerated treatment well       Past Medical History:  Diagnosis Date  . Anxiety   . GERD (gastroesophageal reflux disease)   . Heart murmur   . Hyperlipidemia   . Insomnia   . Mitral valve regurgitation    mild  . Sinusitis     Past Surgical History:  Procedure Laterality Date  . BUNIONECTOMY     Right foot   . DILATION AND CURETTAGE OF UTERUS      There were no vitals filed for this visit.  Subjective Assessment - 09/04/17 0929    Subjective  I liked the dry needling.  Minimal soreness.  I haven't done the exercises b/c of work and other obligations.  Reports she's been dealing with depression and that impacts her.      Pertinent History  Sept foot surgery ;  reports 30# weight gain in the last few years;  used to do [employer] but stopped b/c of foot surgery;  hx of depression and worsening recently not going out as much    Currently in Pain?  Yes    Pain Score  4  went first get up    Pain Location  Buttocks    Pain Orientation  Right    Pain Type  Chronic pain    Pain Radiating Towards  right hip/buttock    Pain Onset  More than a month ago    Pain Frequency  Intermittent    Aggravating Factors   walking                      [facility] Adult PT Treatment/Exercise - 09/04/17 0001      Knee/Hip Exercises: Aerobic   Nustep  10 min L1 seat 10      Knee/Hip Exercises: Standing   Other  Standing Knee Exercises  stand tall gluteus medius muscle activation ex 10x bil      Knee/Hip Exercises: Sidelying   Clams  15x right      Moist Heat Therapy   Number Minutes Moist Heat  8 Minutes    Moist Heat Location  Lumbar Spine;Hip      Manual Therapy   Joint Mobilization  right hip long axis distraction, inferior mobs, AP in internal rotation prone PA, PA in internal  rotation and external rotation; neutral gapping grade 3 3x 20 sec each    Soft tissue mobilization  bil lumbar paraspinals, gluteals and piriformis       Trigger Point Dry Needling - 09/04/17 1035    Consent Given?  Yes    Gluteus Maximus Response  Twitch response elicited;Palpable increased muscle length    Gluteus Minimus Response  Twitch response elicited;Palpable increased muscle length    Piriformis Response  Twitch response elicited;Palpable increased muscle length      Right only  PT Education - 09/04/17 1015    Education provided  Yes    Education Details  clams; stand tall ex    Person(s) Educated  Patient    Methods  Explanation;Demonstration;Handout    Comprehension  Verbalized understanding;Returned demonstration       PT Short Term Goals - 08/24/17 1217      PT SHORT TERM GOAL #1   Title  The patient will demonstrate understanding of basic self care strategies and initial HEP    Time  4    Period  Weeks    Status  New    Target Date  09/21/17      PT SHORT TERM GOAL #2   Title  The patient will report a 30% reduction in pain with rising from the chair and with being on her feet at work    Time  4    Period  Weeks    Status  New      PT Lyons #3   Title  The patient will have right hamstring length to 80 degrees and hip internal rotation to 15 degreees needed for normal mobility at work and home    Time  4    Period  Weeks    Status  New      PT SHORT TERM GOAL #4   Title  The patient will be able to walk 10-15 min (outside of work) with min complaint of pain     Time  4    Period  Weeks    Status  New        PT Long Term Goals - 08/24/17 1222      PT LONG TERM GOAL #1   Title  The patient will be independent in safe, self progression of HEP and a basic gym program (possible return to [employer])    Time  8    Period  Weeks    Status  New    Target Date  10/19/17      PT LONG TERM GOAL #2   Title  The patient will report a 60% improvement in hip and back pain with rising and initial walking    Time  8    Period  Weeks    Status  New      PT LONG TERM GOAL #3   Title  The 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    Time  8    Period  Weeks    Status  New      PT LONG TERM GOAL #4   Title  The patient will be able to single leg stand on right at least 8 sec    Time  8    Period  Weeks    Status  New      PT LONG TERM GOAL #5   Title  Lumbar extension ROM to 25 degrees and bilateral sidebending to 33 degrees 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  She has not been compliant with her HEP secondary to work and personal stress factors.  She states she has been depressed and dealing with family issues and that keeps her from being motivated to exercise.  Discussed starting with just 2 minutes a day of ex.      Rehab Potential  Good    PT Frequency  2x / week    PT Duration  8 weeks    PT Treatment/Interventions  ADLs/Self Care Home Management;Iontophoresis 4mg /ml Dexamethasone;Neuromuscular  re-education;Dry needling;Cryotherapy;Electrical Stimulation;Ultrasound;Moist Heat;Therapeutic activities;Therapeutic exercise;Patient/family education;Manual techniques;Taping    PT Next Visit Plan  assess response to  DN #2 of piriformis;   right hip mobs; increase resistance on  Nu-STep;   core strength;  gluteus medius strengthening    Recommended Other Services  cert signed and scanned by [place]       Patient will benefit from skilled therapeutic intervention in order to improve the following deficits and impairments:  Pain, Difficulty walking, Decreased strength, Decreased range of motion, Impaired flexibility  Visit Diagnosis: Chronic bilateral low back pain without sciatica  Pain in right hip  Muscle weakness (generalized)  Stiffness of right hip, not elsewhere classified     Problem List Patient Active Problem List   Diagnosis Date Noted  . Abdominal pain, chronic, right lower quadrant 08/13/2013  . Unspecified hypothyroidism 05/06/2013  . Transaminitis 01/18/2012  . Anxiety and depression 11/17/2011  . Preventative health care 11/17/2011  . Osteopenia 11/17/2011  . Chronic insomnia 11/17/2011  . Hyperlipidemia 11/17/2011  . Gastroenteritis 11/17/2011    Ruben Im, PT 09/04/17 10:46 AM Phone: [phone] Fax: [phone]  Alvera Singh 09/04/2017, 10:45 AM  [facility] [address] [place], [place], [address] Phone: [phone]   Fax:  [phone]  Name: Megan Greer MRN: [id number] Date of Birth: September 14, 1955

## 2017-09-04 NOTE — Patient Instructions (Signed)
   Try just 2 minutes a day of exercise.          Abduction: Clam (Eccentric) - Side-Lying   Lie on side with knees bent. Lift top knee, keeping feet together. Keep trunk steady. Slowly lower for 3-5 seconds. __10-15_ reps per set, _1__ sets per day, 7__ days per week. Add  theraband tied around knees when you achieve _25__ repetitions.  Copyright  VHI. All rights reserved.      Ruben Im PT [facility] [address] [place], [place] [address] Phone # [phone] Fax [phone]

## 2017-09-07 ENCOUNTER — Ambulatory Visit: Payer: 59 | Admitting: Physical Therapy

## 2017-09-07 DIAGNOSIS — M25651 Stiffness of right hip, not elsewhere classified: Secondary | ICD-10-CM

## 2017-09-07 DIAGNOSIS — M25551 Pain in right hip: Secondary | ICD-10-CM | POA: Diagnosis not present

## 2017-09-07 DIAGNOSIS — E039 Hypothyroidism, unspecified: Secondary | ICD-10-CM | POA: Diagnosis not present

## 2017-09-07 DIAGNOSIS — M6281 Muscle weakness (generalized): Secondary | ICD-10-CM

## 2017-09-07 DIAGNOSIS — Z6829 Body mass index (BMI) 29.0-29.9, adult: Secondary | ICD-10-CM | POA: Diagnosis not present

## 2017-09-07 DIAGNOSIS — M1990 Unspecified osteoarthritis, unspecified site: Secondary | ICD-10-CM | POA: Diagnosis not present

## 2017-09-07 DIAGNOSIS — G8929 Other chronic pain: Secondary | ICD-10-CM | POA: Diagnosis not present

## 2017-09-07 DIAGNOSIS — Z79899 Other long term (current) drug therapy: Secondary | ICD-10-CM | POA: Diagnosis not present

## 2017-09-07 DIAGNOSIS — R7303 Prediabetes: Secondary | ICD-10-CM | POA: Diagnosis not present

## 2017-09-07 DIAGNOSIS — E7800 Pure hypercholesterolemia, unspecified: Secondary | ICD-10-CM | POA: Diagnosis not present

## 2017-09-07 DIAGNOSIS — M545 Low back pain: Principal | ICD-10-CM

## 2017-09-07 NOTE — Therapy (Signed)
[facility] [address] [place], [place], [address] Phone: [phone]   Fax:  [phone]  Physical Therapy Treatment  Patient Details  Name: Megan Greer MRN: [id number] Date of Birth: [birth year] Referring Provider: Dr. Jonny Ruiz   Encounter Date: 09/07/2017  PT End of Session - 09/07/17 1223    Visit Number  4    Date for PT Re-Evaluation  10/19/17    Authorization Type  UMR    PT Start Time  0847    PT Stop Time  0935    PT Time Calculation (min)  48 min    Activity Tolerance  Patient tolerated treatment well       Past Medical History:  Diagnosis Date  . Anxiety   . GERD (gastroesophageal reflux disease)   . Heart murmur   . Hyperlipidemia   . Insomnia   . Mitral valve regurgitation    mild  . Sinusitis     Past Surgical History:  Procedure Laterality Date  . BUNIONECTOMY     Right foot   . DILATION AND CURETTAGE OF UTERUS      There were no vitals filed for this visit.  Subjective Assessment - 09/07/17 1220    Subjective  Patient reports no changes.  Noticed some pain in the other leg and calf for no reason earlier this morning.      Currently in Pain?  Yes    Pain Score  4     Pain Location  Buttocks    Pain Orientation  Right    Pain Type  Chronic pain                      [facility] Adult PT Treatment/Exercise - 09/07/17 0001      Neuro Re-ed    Neuro Re-ed Details   neuroscience of pain education      Knee/Hip Exercises: Stretches   Active Hamstring Stretch  Right;Left;5 reps    Quad Stretch  Right;Left;5 reps on 2nd step      Knee/Hip Exercises: Aerobic   Nustep  10 min L1 seat 10    Other Aerobic  discussion of local Pilates options including [facility] 6 week series with Raeford Razor PT      Knee/Hip Exercises: Standing   Hip Abduction  Stengthening;Right;Left;10 reps    Abduction Limitations  red band    Hip Extension  Stengthening;Right;Left;10 reps    Extension Limitations  red band small range      Moist Heat Therapy   Number Minutes Moist Heat  12 Minutes    Moist Heat Location  Lumbar Spine;Hip      Electrical Stimulation   Electrical Stimulation Location  lumbar and right buttock    Electrical Stimulation Action  IFC    Electrical Stimulation Parameters  8 ma 12 min    Electrical Stimulation Goals  Pain      Manual Therapy   Manual Therapy  Taping    Joint Mobilization  right hip long axis distraction, inferior mobs, AP in internal rotation grade 3 3x 20 sec each    Muscle Energy Technique  right contract relax piriformis 3x 5 sec hold    Kinesiotex  Facilitate Muscle      Kinesiotix   Facilitate Muscle   3 strips right glutes               PT Short Term Goals - 08/24/17 1217      PT SHORT  TERM GOAL #1   Title  The patient will demonstrate understanding of basic self care strategies and initial HEP    Time  4    Period  Weeks    Status  New    Target Date  09/21/17      PT SHORT TERM GOAL #2   Title  The patient will report a 30% reduction in pain with rising from the chair and with being on her feet at work    Time  4    Period  Weeks    Status  New      PT [place] #3   Title  The patient will have right hamstring length to 80 degrees and hip internal rotation to 15 degreees needed for normal mobility at work and home    Time  4    Period  Weeks    Status  New      PT SHORT TERM GOAL #4   Title  The patient will be able to walk 10-15 min (outside of work) with min complaint of pain    Time  4    Period  Weeks    Status  New        PT Long Term Goals - 08/24/17 1222      PT LONG TERM GOAL #1   Title  The patient will be independent in safe, self progression of HEP and a basic gym program (possible return to [employer])    Time  8    Period  Weeks    Status  New    Target Date  10/19/17      PT LONG TERM GOAL #2   Title  The patient will report a 60% improvement in hip and back  pain with rising and initial walking    Time  8    Period  Weeks    Status  New      PT LONG TERM GOAL #3   Title  The 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    Time  8    Period  Weeks    Status  New      PT LONG TERM GOAL #4   Title  The patient will be able to single leg stand on right at least 8 sec    Time  8    Period  Weeks    Status  New      PT LONG TERM GOAL #5   Title  Lumbar extension ROM to 25 degrees and bilateral sidebending to 33 degrees 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   Pain education of nerve/brain hypersensitization with chronic pain.  She reports good pain relief with ES/heat as well  as kinesiotape applied to faciliate gluteus medius.      Rehab Potential  Good    PT Frequency  2x / week    PT Duration  8 weeks    PT Treatment/Interventions  ADLs/Self Care Home Management;Iontophoresis 4mg /ml Dexamethasone;Neuromuscular re-education;Dry needling;Cryotherapy;Electrical Stimulation;Ultrasound;Moist Heat;Therapeutic activities;Therapeutic exercise;Patient/family education;Manual techniques;Taping    PT Next Visit Plan   DN #3 piriformis;   right hip mobs; increase resistance on  Nu-STep;   core strength;  gluteus medius strengthening;  assess response to Kinesiotape;  ES/heat       Patient will benefit from skilled therapeutic intervention in order to improve the following deficits and impairments:  Pain,  Difficulty walking, Decreased strength, Decreased range of motion, Impaired flexibility  Visit Diagnosis: Chronic bilateral low back pain without sciatica  Pain in right hip  Muscle weakness (generalized)  Stiffness of right hip, not elsewhere classified     Problem List Patient Active Problem List   Diagnosis Date Noted  . Abdominal pain, chronic, right lower quadrant 08/13/2013  . Unspecified hypothyroidism 05/06/2013  . Transaminitis 01/18/2012  . Anxiety and depression 11/17/2011  . Preventative health care 11/17/2011  . Osteopenia 11/17/2011  . Chronic insomnia 11/17/2011  . Hyperlipidemia 11/17/2011  . Gastroenteritis 11/17/2011   Ruben Im, PT 09/07/17 12:29 PM Phone: [phone] Fax: [phone]  Alvera Singh 09/07/2017, 12:29 PM  [facility] [address] [place], [place], [address] Phone: [phone]   Fax:  [phone]  Name: SERAYAH YAZDANI MRN: [id number] Date of Birth: Jul 12, 1956

## 2017-09-10 ENCOUNTER — Ambulatory Visit: Payer: 59 | Admitting: Physical Therapy

## 2017-09-10 DIAGNOSIS — R7303 Prediabetes: Secondary | ICD-10-CM | POA: Diagnosis not present

## 2017-09-10 DIAGNOSIS — E7800 Pure hypercholesterolemia, unspecified: Secondary | ICD-10-CM | POA: Diagnosis not present

## 2017-09-10 DIAGNOSIS — E039 Hypothyroidism, unspecified: Secondary | ICD-10-CM | POA: Diagnosis not present

## 2017-09-10 DIAGNOSIS — Z79899 Other long term (current) drug therapy: Secondary | ICD-10-CM | POA: Diagnosis not present

## 2017-09-12 ENCOUNTER — Encounter: Payer: Self-pay | Admitting: Physical Therapy

## 2017-09-14 ENCOUNTER — Ambulatory Visit: Payer: 59 | Attending: Family Medicine | Admitting: Physical Therapy

## 2017-09-14 ENCOUNTER — Encounter: Payer: Self-pay | Admitting: Physical Therapy

## 2017-09-14 DIAGNOSIS — M25551 Pain in right hip: Secondary | ICD-10-CM | POA: Diagnosis not present

## 2017-09-14 DIAGNOSIS — M545 Low back pain: Secondary | ICD-10-CM | POA: Insufficient documentation

## 2017-09-14 DIAGNOSIS — M25651 Stiffness of right hip, not elsewhere classified: Secondary | ICD-10-CM | POA: Diagnosis not present

## 2017-09-14 DIAGNOSIS — M6281 Muscle weakness (generalized): Secondary | ICD-10-CM | POA: Insufficient documentation

## 2017-09-14 DIAGNOSIS — G8929 Other chronic pain: Secondary | ICD-10-CM | POA: Diagnosis not present

## 2017-09-14 NOTE — Patient Instructions (Signed)
    Green band:    Tied in a bow around thighs:  Double knees apart, singles knees alternating 10x   Wrap band around the top and bottom of your foot.  Hold ends in your hands.  Push straight leg downward. Like the Pilates push down.    10x    Ruben Im PT [facility] [address], [place], [place] [address] Phone # [phone] Fax [phone]

## 2017-09-14 NOTE — Therapy (Signed)
[facility] [address], [place], [place], [address] Phone: [phone]   Fax:  [phone]  Physical Therapy Treatment  Patient Details  Name: Megan Greer MRN: [id number] Date of Birth: June 08, 1956 Referring Provider: Dr. Jonny Ruiz   Encounter Date: 09/14/2017  PT End of Session - 09/14/17 1010    Visit Number  5    Date for PT Re-Evaluation  10/19/17    Authorization Type  UMR    PT Start Time  0930    PT Stop Time  1020    PT Time Calculation (min)  50 min    Activity Tolerance  Patient tolerated treatment well       Past Medical History:  Diagnosis Date  . Anxiety   . GERD (gastroesophageal reflux disease)   . Heart murmur   . Hyperlipidemia   . Insomnia   . Mitral valve regurgitation    mild  . Sinusitis     Past Surgical History:  Procedure Laterality Date  . BUNIONECTOMY     Right foot   . DILATION AND CURETTAGE OF UTERUS      There were no vitals filed for this visit.  Subjective Assessment - 09/14/17 0926    Subjective  Been really good the last few days.  I haven't had to take any Celebrex.  I was achy last night but I think it was from lifting at work in a different unit.  Central sacral region. Unsure if kinesiotape helped or not   I've had to help my daughter, who has a psychiatric disorder, who is going to be staying with her a while.      Currently in Pain?  Yes    Pain Score  1     Pain Location  Back    Pain Orientation  Lower    Pain Type  Chronic pain    Aggravating Factors   rising from sit to stand    Pain Relieving Factors  moving around                      [facility] Adult PT Treatment/Exercise - 09/14/17 0001      Lumbar Exercises: Supine   Ab Set  5 reps    Clam  20 reps    Clam Limitations  green band    Bent Knee Raise  5 reps    Other Supine Lumbar Exercises  green band Pilates hip extension 10x right/left      Lumbar Exercises: Quadruped   Single Arm Raise  Right;Left;5 reps    Straight Leg Raise  5 reps    Opposite Arm/Leg Raise  Right arm/Left leg;Left arm/Right leg;5 reps      Knee/Hip Exercises: Stretches   Other Knee/Hip Stretches  psoas doorway stretch    Other Knee/Hip Stretches  foam roll childs pose 5x      Knee/Hip Exercises: Aerobic   Nustep  10 min L2 seat 10      Knee/Hip Exercises: Standing   Hip Abduction  Stengthening;Right;Left;10 reps    Abduction Limitations  red band    Hip Extension  Stengthening;Right;Left;10 reps    Extension Limitations  red band small range      Moist Heat Therapy   Number Minutes Moist Heat  15 Minutes    Moist Heat Location  Lumbar Spine;Hip      Electrical Stimulation   Electrical Stimulation Location  lumbar    Electrical Stimulation Action  IFC  Electrical Stimulation Parameters  9 ma 15 min    Electrical Stimulation Goals  Pain             PT Education - 09/14/17 1009    Education provided  Yes    Education Details  green band supine clams;  Pilates band hip extension     Person(s) Educated  Patient    Methods  Explanation;Demonstration;Handout    Comprehension  Verbalized understanding;Returned demonstration       PT Short Term Goals - 08/24/17 1217      PT SHORT TERM GOAL #1   Title  The patient will demonstrate understanding of basic self care strategies and initial HEP    Time  4    Period  Weeks    Status  New    Target Date  09/21/17      PT SHORT TERM GOAL #2   Title  The patient will report a 30% reduction in pain with rising from the chair and with being on her feet at work    Time  4    Period  Weeks    Status  New      PT [place] #3   Title  The patient will have right hamstring length to 80 degrees and hip internal rotation to 15 degreees needed for normal mobility at work and home    Time  4    Period  Weeks    Status  New      PT SHORT TERM GOAL #4   Title  The patient will be able to walk 10-15 min (outside of  work) with min complaint of pain    Time  4    Period  Weeks    Status  New        PT Long Term Goals - 08/24/17 1222      PT LONG TERM GOAL #1   Title  The patient will be independent in safe, self progression of HEP and a basic gym program (possible return to [employer])    Time  8    Period  Weeks    Status  New    Target Date  10/19/17      PT LONG TERM GOAL #2   Title  The patient will report a 60% improvement in hip and back pain with rising and initial walking    Time  8    Period  Weeks    Status  New      PT LONG TERM GOAL #3   Title  The 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    Time  8    Period  Weeks    Status  New      PT LONG TERM GOAL #4   Title  The patient will be able to single leg stand on right at least 8 sec    Time  8    Period  Weeks    Status  New      PT LONG TERM GOAL #5   Title  Lumbar extension ROM to 25 degrees and bilateral sidebending to 33 degrees 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  She responds well to exercise with minimal complaint of pain.  She has a high level of stress at home which has impacted her ability to be compliant with a HEP.  She is on track to meet STGs.     Rehab Potential  Good    PT Frequency  2x / week    PT Duration  8 weeks    PT Treatment/Interventions  ADLs/Self Care Home Management;Iontophoresis 4mg /ml Dexamethasone;Neuromuscular re-education;Dry needling;Cryotherapy;Electrical Stimulation;Ultrasound;Moist  Heat;Therapeutic activities;Therapeutic exercise;Patient/family education;Manual techniques;Taping    PT Next Visit Plan   check STGs;  DN #3 piriformis;   right hip mobs; increase resistance on  Nu-STep;   core strength;  gluteus medius strengthening;  assess response to Kinesiotape;  ES/heat       Patient will benefit from skilled therapeutic intervention in order to improve the following deficits and impairments:  Pain, Difficulty walking, Decreased strength, Decreased range of motion, Impaired flexibility  Visit Diagnosis: Chronic bilateral low back pain without sciatica  Pain in right hip  Muscle weakness (generalized)  Stiffness of right hip, not elsewhere classified     Problem List Patient Active Problem List   Diagnosis Date Noted  . Abdominal pain, chronic, right lower quadrant 08/13/2013  . Unspecified hypothyroidism 05/06/2013  . Transaminitis 01/18/2012  . Anxiety and depression 11/17/2011  . Preventative health care 11/17/2011  . Osteopenia 11/17/2011  . Chronic insomnia 11/17/2011  . Hyperlipidemia 11/17/2011  . Gastroenteritis 11/17/2011   Ruben Im, PT 09/14/17 12:09 PM Phone: [phone] Fax: [phone]  Alvera Singh 09/14/2017, 12:09 PM  [facility] [address] [place], [place], [address] Phone: [phone]   Fax:  [phone]  Name: Megan Greer MRN: [id number] Date of Birth: [birth year]

## 2017-09-18 ENCOUNTER — Encounter: Payer: Self-pay | Admitting: Physical Therapy

## 2017-09-18 ENCOUNTER — Ambulatory Visit: Payer: 59 | Admitting: Physical Therapy

## 2017-09-18 DIAGNOSIS — M25651 Stiffness of right hip, not elsewhere classified: Secondary | ICD-10-CM | POA: Diagnosis not present

## 2017-09-18 DIAGNOSIS — M545 Low back pain: Principal | ICD-10-CM

## 2017-09-18 DIAGNOSIS — M6281 Muscle weakness (generalized): Secondary | ICD-10-CM

## 2017-09-18 DIAGNOSIS — M25551 Pain in right hip: Secondary | ICD-10-CM

## 2017-09-18 DIAGNOSIS — G8929 Other chronic pain: Secondary | ICD-10-CM | POA: Diagnosis not present

## 2017-09-18 NOTE — Therapy (Signed)
[facility] [address] [place], [place], [address] Phone: [phone]   Fax:  [phone]  Physical Therapy Treatment  Patient Details  Name: Megan Greer MRN: [id number] Date of Birth: Sep 26, 1955 Referring Provider: Dr. Jonny Ruiz   Encounter Date: 09/18/2017  PT End of Session - 09/18/17 1127    Visit Number  6    Date for PT Re-Evaluation  10/19/17    Authorization Type  UMR    PT Start Time  0932    PT Stop Time  1020    PT Time Calculation (min)  48 min    Activity Tolerance  Patient tolerated treatment well       Past Medical History:  Diagnosis Date  . Anxiety   . GERD (gastroesophageal reflux disease)   . Heart murmur   . Hyperlipidemia   . Insomnia   . Mitral valve regurgitation    mild  . Sinusitis     Past Surgical History:  Procedure Laterality Date  . BUNIONECTOMY     Right foot   . DILATION AND CURETTAGE OF UTERUS      There were no vitals filed for this visit.  Subjective Assessment - 09/18/17 0935    Subjective  It's been challenging with my daughter at home.  I worked Sunday.  I noticed when I got down on the floor yesterday, I hurt more afterwards and limped the first few steps.  Patient later reports she did some yoga.      Currently in Pain?  Yes    Pain Score  4     Pain Location  Back    Pain Orientation  Lower                      [facility] Adult PT Treatment/Exercise - 09/18/17 0001      Lumbar Exercises: Aerobic   UBE (Upper Arm Bike)  8 min with lumbar roll       Lumbar Exercises: Seated   Other Seated Lumbar Exercises  Long discussion of HEP and how to modify, expected response; appropriate intensity;  Pain rule limits      Moist Heat Therapy   Number Minutes Moist Heat  15 Minutes    Moist Heat Location  Lumbar Spine;Hip      Electrical Stimulation   Electrical Stimulation Location  lumbar    Electrical Stimulation Action  IFC    Electrical  Stimulation Parameters  10 ma 15 min supine    Electrical Stimulation Goals  Pain      Manual Therapy   Soft tissue mobilization  Instrument assisted soft tissue to lumbar, upper gluteals bil               PT Short Term Goals - 09/18/17 1134      PT SHORT TERM GOAL #1   Title  The patient will demonstrate understanding of basic self care strategies and initial HEP    Time  4    Period  Weeks    Status  On-going      PT SHORT TERM GOAL #2   Title  The patient will report a 30% reduction in pain with rising from the chair and with being on her feet at work    Time  4    Period  Weeks    Status  On-going      PT SHORT TERM GOAL #3   Title  The patient will have right hamstring  length to 80 degrees and hip internal rotation to 15 degreees needed for normal mobility at work and home    Time  4    Period  Weeks    Status  On-going      PT SHORT TERM GOAL #4   Title  The patient will be able to walk 10-15 min (outside of work) with min complaint of pain    Time  4    Period  Weeks    Status  On-going        PT Long Term Goals - 08/24/17 1222      PT LONG TERM GOAL #1   Title  The patient will be independent in safe, self progression of HEP and a basic gym program (possible return to [employer])    Time  8    Period  Weeks    Status  New    Target Date  10/19/17      PT LONG TERM GOAL #2   Title  The patient will report a 60% improvement in hip and back pain with rising and initial walking    Time  8    Period  Weeks    Status  New      PT LONG TERM GOAL #3   Title  The 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    Time  8    Period  Weeks    Status  New      PT LONG TERM GOAL #4   Title  The patient will be able to single leg stand on right at least 8 sec    Time  8    Period  Weeks    Status  New      PT LONG TERM GOAL #5   Title  Lumbar extension ROM to 25 degrees  and bilateral sidebending to 33 degrees 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  After further discussion, she reports she was doing a lot of her previous yoga stretches rather than her PT HEP.  Painful with turning on table and disucssed core activation of muscles with this movement.  Treatment focus on pain relief today secondary to recent exacerbation.      Rehab Potential  Good    PT Frequency  2x / week    PT Duration  8 weeks    PT Treatment/Interventions  ADLs/Self Care Home Management;Iontophoresis 4mg /ml Dexamethasone;Neuromuscular re-education;Dry needling;Cryotherapy;Electrical Stimulation;Ultrasound;Moist Heat;Therapeutic activities;Therapeutic exercise;Patient/family education;Manual techniques;Taping    PT Next Visit Plan  review HEP;   check STGs;  core activation with turning in bed;  DN #3 piriformis;   right hip mobs; increase resistance on  Nu-STep;   core strength;  gluteus medius strengthening;  assess response to Kinesiotape;  ES/heat       Patient will benefit from skilled therapeutic intervention in order to improve the following deficits and impairments:  Pain, Difficulty walking, Decreased strength, Decreased range of motion, Impaired flexibility  Visit Diagnosis: Chronic bilateral low back pain without sciatica  Pain in right hip  Muscle weakness (generalized)  Stiffness of right hip, not elsewhere classified     Problem List Patient Active Problem List    Diagnosis Date Noted  . Abdominal pain, chronic, right lower quadrant 08/13/2013  . Unspecified hypothyroidism 05/06/2013  . Transaminitis 01/18/2012  . Anxiety and depression 11/17/2011  . Preventative health care 11/17/2011  . Osteopenia 11/17/2011  . Chronic insomnia 11/17/2011  . Hyperlipidemia 11/17/2011  . Gastroenteritis 11/17/2011   Ruben Im, PT 09/18/17 11:35 AM Phone: [phone] Fax: [phone]  Alvera Singh 09/18/2017, 11:35 AM  [facility] [address], [place], [place], [address] Phone: [phone]   Fax:  [phone]  Name: Megan Greer MRN: [id number] Date of Birth: 01-29-1956

## 2017-09-21 ENCOUNTER — Encounter: Payer: Self-pay | Admitting: Physical Therapy

## 2017-09-21 ENCOUNTER — Ambulatory Visit: Payer: 59 | Admitting: Physical Therapy

## 2017-09-21 DIAGNOSIS — M6281 Muscle weakness (generalized): Secondary | ICD-10-CM

## 2017-09-21 DIAGNOSIS — M545 Low back pain: Principal | ICD-10-CM

## 2017-09-21 DIAGNOSIS — G8929 Other chronic pain: Secondary | ICD-10-CM | POA: Diagnosis not present

## 2017-09-21 DIAGNOSIS — M25651 Stiffness of right hip, not elsewhere classified: Secondary | ICD-10-CM

## 2017-09-21 DIAGNOSIS — M25551 Pain in right hip: Secondary | ICD-10-CM | POA: Diagnosis not present

## 2017-09-21 NOTE — Therapy (Signed)
[facility] [address] [place], [place], [address] Phone: [phone]   Fax:  [phone]  Physical Therapy Treatment  Patient Details  Name: Megan Greer MRN: [id number] Date of Birth: 07-23-1956 Referring Provider: Dr. Jonny Ruiz   Encounter Date: 09/21/2017  PT End of Session - 09/21/17 1157    Visit Number  7    Date for PT Re-Evaluation  10/19/17    Authorization Type  UMR    PT Start Time  1017    PT Stop Time  1110    PT Time Calculation (min)  53 min    Activity Tolerance  Patient tolerated treatment well       Past Medical History:  Diagnosis Date  . Anxiety   . GERD (gastroesophageal reflux disease)   . Heart murmur   . Hyperlipidemia   . Insomnia   . Mitral valve regurgitation    mild  . Sinusitis     Past Surgical History:  Procedure Laterality Date  . BUNIONECTOMY     Right foot   . DILATION AND CURETTAGE OF UTERUS      There were no vitals filed for this visit.  Subjective Assessment - 09/21/17 1024    Subjective  My pain and mobility is variable.  "Today I'm pretty spry."  My back doesn't hurt today.      Currently in Pain?  Yes    Pain Score  3          [facility] PT Assessment - 09/21/17 0001      AROM   Right Hip External Rotation   45    Right Hip Internal Rotation   15    Left Hip External Rotation   45    Left Hip Internal Rotation   15    Lumbar Flexion  70    Lumbar Extension  25    Lumbar - Right Side Bend  25    Lumbar - Left Side Bend  25      Strength   Right Hip ABduction  4/5      Flexibility   Hamstrings  90 bil           Nu-Step L3 12 min  Pain education      [facility] Adult PT Treatment/Exercise - 09/21/17 0001      Lumbar Exercises: Supine   Ab Set  5 reps    Bent Knee Raise  10 reps    Dead Bug  10 reps      Lumbar Exercises: Prone   Other Prone Lumbar Exercises  multifidi series per HEP 5 x each      Moist Heat Therapy   Number  Minutes Moist Heat  15 Minutes    Moist Heat Location  Lumbar Spine;Hip      Electrical Stimulation   Electrical Stimulation Location  lumbar    Electrical Stimulation Action  IFC    Electrical Stimulation Parameters  9 ma 15 min    Electrical Stimulation Goals  Pain             PT Education - 09/21/17 1057    Education provided  Yes    Education Details  prone multifidi series ; neuroscience of pain info    Person(s) Educated  Patient    Methods  Explanation;Demonstration;Handout    Comprehension  Verbalized understanding;Returned demonstration       PT Short Term Goals - 09/21/17 1056      PT SHORT TERM  GOAL #1   Title  The patient will demonstrate understanding of basic self care strategies and initial HEP    Status  Achieved      PT SHORT TERM GOAL #2   Title  The patient will report a 30% reduction in pain with rising from the chair and with being on her feet at work    Baseline  65%    Status  Achieved      PT [place] #3   Title  The patient will have right hamstring length to 80 degrees and hip internal rotation to 15 degreees needed for normal mobility at work and home    Status  Achieved      PT [place] #4   Title  The patient will be able to walk 10-15 min (outside of work) with min complaint of pain    Status  Achieved        PT Long Term Goals - 08/24/17 1222      PT LONG TERM GOAL #1   Title  The patient will be independent in safe, self progression of HEP and a basic gym program (possible return to [employer])    Time  8    Period  Weeks    Status  New    Target Date  10/19/17      PT LONG TERM GOAL #2   Title  The patient will report a 60% improvement in hip and back pain with rising and initial walking    Time  8    Period  Weeks    Status  New      PT LONG TERM GOAL #3   Title  The 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    Time  8     Period  Weeks    Status  New      PT LONG TERM GOAL #4   Title  The patient will be able to single leg stand on right at least 8 sec    Time  8    Period  Weeks    Status  New      PT LONG TERM GOAL #5   Title  Lumbar extension ROM to 25 degrees and bilateral 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  She reports a 65% improvement since the start of care.  Continues to have right hip abductor weakness and core weakness (particularly transverse abdominus and lumbar multifidi).  Good progress with STGs.      Rehab Potential  Good    PT Frequency  2x / week    PT Duration  8 weeks    PT Treatment/Interventions  ADLs/Self Care Home Management;Iontophoresis 4mg /ml Dexamethasone;Neuromuscular re-education;Dry  needling;Cryotherapy;Electrical Stimulation;Ultrasound;Moist Heat;Therapeutic activities;Therapeutic exercise;Patient/family education;Manual techniques;Taping    PT Next Visit Plan  core strengthening;  right hip abduction strengthening;  manual if needed;  ES/heat as needed       Patient will benefit from skilled therapeutic intervention in order to improve the following deficits and impairments:  Pain, Difficulty walking, Decreased strength, Decreased range of motion, Impaired flexibility  Visit Diagnosis: Chronic bilateral low back pain without sciatica  Pain in right hip  Muscle weakness (generalized)  Stiffness of right hip, not elsewhere classified     Problem List Patient Active Problem List   Diagnosis Date  Noted  . Abdominal pain, chronic, right lower quadrant 08/13/2013  . Unspecified hypothyroidism 05/06/2013  . Transaminitis 01/18/2012  . Anxiety and depression 11/17/2011  . Preventative health care 11/17/2011  . Osteopenia 11/17/2011  . Chronic insomnia 11/17/2011  . Hyperlipidemia 11/17/2011  . Gastroenteritis 11/17/2011   Ruben Im, PT 09/21/17 12:05 PM Phone: [phone] Fax: [phone]  Alvera Singh 09/21/2017, 12:04 PM  [facility] [address], [place], [place], [address] Phone: [phone]   Fax:  [phone]  Name: Megan Greer MRN: [id number] Date of Birth: Jun 25, 1956

## 2017-09-21 NOTE — Patient Instructions (Signed)
     Curable app   Lots of free stuff to listen to and they will email info too.     YouTube:  Understanding the Complexity of Pain in 5 minutes                    [employer]

## 2017-09-27 ENCOUNTER — Encounter: Payer: Self-pay | Admitting: Physical Therapy

## 2017-09-27 ENCOUNTER — Ambulatory Visit: Payer: 59 | Admitting: Physical Therapy

## 2017-09-27 DIAGNOSIS — G8929 Other chronic pain: Secondary | ICD-10-CM | POA: Diagnosis not present

## 2017-09-27 DIAGNOSIS — M25551 Pain in right hip: Secondary | ICD-10-CM

## 2017-09-27 DIAGNOSIS — M25651 Stiffness of right hip, not elsewhere classified: Secondary | ICD-10-CM | POA: Diagnosis not present

## 2017-09-27 DIAGNOSIS — M545 Low back pain, unspecified: Secondary | ICD-10-CM

## 2017-09-27 DIAGNOSIS — M6281 Muscle weakness (generalized): Secondary | ICD-10-CM | POA: Diagnosis not present

## 2017-09-27 NOTE — Therapy (Signed)
[facility] [address], [place], [place], [address] Phone: [phone]   Fax:  [phone]  Physical Therapy Treatment  Patient Details  Name: Megan Greer MRN: [id number] Date of Birth: 08-01-1956 Referring Provider: Dr. Jonny Ruiz   Encounter Date: 09/27/2017  PT End of Session - 09/27/17 1031    Visit Number  8    Date for PT Re-Evaluation  10/19/17    Authorization Type  UMR    PT Start Time  0930    PT Stop Time  1020    PT Time Calculation (min)  50 min    Activity Tolerance  Patient tolerated treatment well       Past Medical History:  Diagnosis Date  . Anxiety   . GERD (gastroesophageal reflux disease)   . Heart murmur   . Hyperlipidemia   . Insomnia   . Mitral valve regurgitation    mild  . Sinusitis     Past Surgical History:  Procedure Laterality Date  . BUNIONECTOMY     Right foot   . DILATION AND CURETTAGE OF UTERUS      There were no vitals filed for this visit.  Subjective Assessment - 09/27/17 0930    Subjective  It's been a trying week b/c of home situation.   I haven't done my exercises.   I've had to rely on some medication this week.  The pain is still there.  It does feel better after sessions but then it comes back.  Deep right hip pain with walking is about the same and worsened with rising and prolonged walking.      Currently in Pain?  Yes    Pain Score  -- posterior thigh    Pain Location  Hip    Pain Orientation  Lower;Right    Pain Type  Chronic pain    Pain Frequency  Intermittent    Aggravating Factors   sitting straight up in bed         [facility] PT Assessment - 09/27/17 0001      Strength   Right Hip Extension  4+/5    Right Hip ABduction  4/5    Left Hip Extension  4+/5    Left Hip ABduction  4/5    Lumbar Flexion  4-/5    Lumbar Extension  4-/5                  [facility] Adult PT Treatment/Exercise - 09/27/17 0001      Lumbar Exercises:  Supine   Ab Set  5 reps    Other Supine Lumbar Exercises  ball pass knees to hands 10x      Lumbar Exercises: Quadruped   Other Quadruped Lumbar Exercises  fire hydrants 10x bil    Other Quadruped Lumbar Exercises  hip extension with green band 10x bil      Knee/Hip Exercises: Aerobic   Nustep  14 min L2 seat 10      Knee/Hip Exercises: Sidelying   Other Sidelying Knee/Hip Exercises  green band 15x bil      Moist Heat Therapy   Number Minutes Moist Heat  10 Minutes    Moist Heat Location  Lumbar Spine;Hip      Electrical Stimulation   Electrical Stimulation Location  lumbar    Electrical Stimulation Action  pre-mod with DN    Electrical Stimulation Parameters  10 L1.5    Electrical Stimulation Goals  Pain  Manual Therapy   Soft tissue mobilization  bil lumbar paraspinals, gluteals and piriformis       Trigger Point Dry Needling - 09/27/17 1030    Consent Given?  Yes    Muscles Treated Lower Body  -- bil lumbar multifidi    Gluteus Maximus Response  Twitch response elicited;Palpable increased muscle length    Gluteus Minimus Response  Twitch response elicited;Palpable increased muscle length    Piriformis Response  Twitch response elicited;Palpable increased muscle length             PT Short Term Goals - 09/27/17 1722      PT SHORT TERM GOAL #1   Title  The patient will demonstrate understanding of basic self care strategies and initial HEP    Status  Achieved      PT SHORT TERM GOAL #2   Title  The patient will report a 30% reduction in pain with rising from the chair and with being on her feet at work    Status  Achieved      PT [place] #3   Title  The patient will have right hamstring length to 80 degrees and hip internal rotation to 15 degreees needed for normal mobility at work and home    Status  Achieved      PT [place] #4   Title  The patient will be able to walk 10-15 min (outside of work) with min complaint of pain    Status   Achieved        PT Long Term Goals - 08/24/17 1222      PT LONG TERM GOAL #1   Title  The patient will be independent in safe, self progression of HEP and a basic gym program (possible return to [employer])    Time  8    Period  Weeks    Status  New    [place] Date  10/19/17      PT LONG TERM GOAL #2   Title  The patient will report a 60% improvement in hip and back pain with rising and initial walking    Time  8    Period  Weeks    Status  New      PT LONG TERM GOAL #3   Title  The 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    Time  8    Period  Weeks    Status  New      PT LONG TERM GOAL #4   Title  The patient will be able to single leg stand on right at least 8 sec    Time  8    Period  Weeks    Status  New      PT LONG TERM GOAL #5   Title  Lumbar extension ROM to 25 degrees and bilateral sidebending to 33 degrees 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  Numerous tender points in gluteals and piriformis but improved soft tissue length following manual therapy and dry needling.  Therapist providing verbal and tactile cues for core and hip muscle activation.      Rehab Potential  Good    PT Frequency  2x / week    PT Duration  8 weeks    PT Treatment/Interventions  ADLs/Self Care Home Management;Iontophoresis 4mg /ml Dexamethasone;Neuromuscular re-education;Dry  needling;Cryotherapy;Electrical Stimulation;Ultrasound;Moist Heat;Therapeutic activities;Therapeutic exercise;Patient/family education;Manual techniques;Taping    PT Next Visit Plan  assess response to DN #3 with ES to DN;   core strengthening;  right hip abduction strengthening;  manual if needed;  ES/heat as needed;  do FOTO next visit       Patient will benefit from skilled therapeutic intervention in order to improve the following deficits and impairments:  Pain, Difficulty walking, Decreased strength, Decreased range of motion, Impaired flexibility  Visit Diagnosis: Chronic bilateral low back pain without sciatica  Pain in right hip  Muscle weakness (generalized)  Stiffness of right hip, not elsewhere classified     Problem List Patient Active Problem List   Diagnosis Date Noted  . Abdominal pain, chronic, right lower quadrant 08/13/2013  . Unspecified hypothyroidism 05/06/2013  . Transaminitis 01/18/2012  . Anxiety and depression 11/17/2011  . Preventative health care 11/17/2011  . Osteopenia 11/17/2011  . Chronic insomnia 11/17/2011  . Hyperlipidemia 11/17/2011  . Gastroenteritis 11/17/2011    Ruben Im, PT 09/27/17 5:24 PM Phone: [phone] Fax: [phone]  Alvera Singh 09/27/2017, 5:24 PM  [facility] [address], [place], [place], [address] Phone: [phone]   Fax:  [phone]  Name: Megan Greer MRN: [id number] Date of Birth: 1955/10/28

## 2017-10-09 ENCOUNTER — Ambulatory Visit: Payer: 59 | Admitting: Physical Therapy

## 2017-10-09 ENCOUNTER — Encounter: Payer: Self-pay | Admitting: Physical Therapy

## 2017-10-09 DIAGNOSIS — M25551 Pain in right hip: Secondary | ICD-10-CM

## 2017-10-09 DIAGNOSIS — G8929 Other chronic pain: Secondary | ICD-10-CM | POA: Diagnosis not present

## 2017-10-09 DIAGNOSIS — M6281 Muscle weakness (generalized): Secondary | ICD-10-CM

## 2017-10-09 DIAGNOSIS — M545 Low back pain, unspecified: Secondary | ICD-10-CM

## 2017-10-09 DIAGNOSIS — M25651 Stiffness of right hip, not elsewhere classified: Secondary | ICD-10-CM

## 2017-10-09 NOTE — Patient Instructions (Signed)
          RE-ALIGNMENT ROUTINE EXERCISES-OSTEOPROROSIS BASIC FOR POSTURAL CORRECTION   RE-ALIGNMENT Tips BENEFITS: 1.It helps to re-align the curves of the back and improve standing posture. 2.It allows the back muscles to rest and strengthen in preparation for more activity. FREQUENCY: Daily, even after weeks, months and years of more advanced exercises. START: 1.All exercises start in the same position: lying on the back, arms resting on the supporting surface, palms up and slightly away from the body, backs of hands down, knees bent, feet flat. 2.The head, neck, arms, and legs are supported according to specific instructions of your therapist. Copyright  VHI. All rights reserved.    1. Decompression Exercise: Basic.   Takes compression off the vertebral bodies; increases tolerance for lying on the back; helps relieve back pain   Lie on back on firm surface, knees bent, feet flat, arms turned up, out to sides (~35 degrees). Head neck and arms supported as necessary. Time _5-15__ minutes. Surface: floor     2. Shoulder Press  Strengthens upper back extensors and scapular retractors.   Press both shoulders down. Hold _2-3__ seconds. Repeat _3-5__ times. Surface: floor        3. Head Press With [place]  Strengthens neck extensors   Tuck chin SLIGHTLY toward chest, keep mouth closed. Feel weight on back of head. Increase weight by pressing head down. Hold _2-3__ seconds. Relax. Repeat 3-5___ times. Surface: floor   4. Leg Lengthener: "like growing your leg long."    5. Whole leg press down   5x Like pressing down into the sand Copyright  VHI. All rights reserved.    Ruben Im PT [facility] [address], [place], [place] [address] Phone # [phone] Fax [phone]

## 2017-10-09 NOTE — Therapy (Addendum)
[facility] [address] [place], [place], [address] Phone: [phone]   Fax:  [phone]  Physical Therapy Treatment/Discharge Summary  Patient Details  Name: Megan Greer MRN: [id number] Date of Birth: 04-Jul-1956 Referring Provider: Dr. Jonny Ruiz   Encounter Date: 10/09/2017  PT End of Session - 10/09/17 1445    Visit Number  9    Date for PT Re-Evaluation  10/19/17    Authorization Type  UMR    PT Start Time  1400    PT Stop Time  1456 DN and moist heat    PT Time Calculation (min)  56 min    Activity Tolerance  Patient tolerated treatment well       Past Medical History:  Diagnosis Date  . Anxiety   . GERD (gastroesophageal reflux disease)   . Heart murmur   . Hyperlipidemia   . Insomnia   . Mitral valve regurgitation    mild  . Sinusitis     Past Surgical History:  Procedure Laterality Date  . BUNIONECTOMY     Right foot   . DILATION AND CURETTAGE OF UTERUS      There were no vitals filed for this visit.  Subjective Assessment - 10/09/17 1357    Subjective  That DN with ES and the DN to piriformis really helped.  Worked all weekend.  Going to start Pilates class in April and another mat class on Thursday.  Deep pain persists in the hip.  Sharper with first few steps then dissipates.      Currently in Pain?  Yes    Pain Score  4     Pain Location  Hip    Pain Orientation  Right    Pain Type  Chronic pain    Aggravating Factors   when first stand up to walk; end of the day    Pain Relieving Factors  when I start walking          [facility] PT Assessment - 10/09/17 0001      Observation/Other Assessments   Focus on Therapeutic Outcomes (FOTO)   30% limitation                   [facility] Adult PT Treatment/Exercise - 10/09/17 0001      Lumbar Exercises: Seated   Other Seated Lumbar Exercises  discussion of return to Silver Sneakers and compliance with HEP      Lumbar  Exercises: Supine   Ab Set  5 reps    Isometric Hip Flexion  10 reps    Other Supine Lumbar Exercises  decompression series 5x each:  shoulder press, head press, deep breathe, long leg pull, whole leg press       Knee/Hip Exercises: Aerobic   Nustep  L2 10 min       Electrical Stimulation   Electrical Stimulation Location  lumbar    Electrical Stimulation Action  pre-mod with DN    Electrical Stimulation Parameters  10 min 1.5 V    Electrical Stimulation Goals  Pain      Manual Therapy   Soft tissue mobilization  bil lumbar paraspinals, gluteals and piriformis       Trigger Point Dry Needling - 10/09/17 1519    Consent Given?  Yes    Muscles Treated Lower Body  -- bil lumbar multifidi    Gluteus Maximus Response  Twitch response elicited;Palpable increased muscle length    Gluteus Minimus Response  Twitch response elicited;Palpable  increased muscle length    Piriformis Response  Twitch response elicited;Palpable increased muscle length      Right side.     PT Education - 10/09/17 1422    Education provided  Yes    Education Details  decompression series    Person(s) Educated  Patient    Methods  Explanation;Handout    Comprehension  Verbalized understanding;Returned demonstration       PT Short Term Goals - 10/09/17 1525      PT SHORT TERM GOAL #1   Title  The patient will demonstrate understanding of basic self care strategies and initial HEP    Status  Achieved      PT SHORT TERM GOAL #2   Title  The patient will report a 30% reduction in pain with rising from the chair and with being on her feet at work    Status  Achieved      PT [place] #3   Title  The patient will have right hamstring length to 80 degrees and hip internal rotation to 15 degreees needed for normal mobility at work and home    Status  Achieved      PT [place] #4   Title  The patient will be able to walk 10-15 min (outside of work) with min complaint of pain    Status   Achieved        PT Long Term Goals - 10/09/17 1526      PT LONG TERM GOAL #1   Title  The patient will be independent in safe, self progression of HEP and a basic gym program (possible return to [employer])    Time  8    Period  Weeks    Status  On-going      PT LONG TERM GOAL #2   Title  The patient will report a 60% improvement in hip and back pain with rising and initial walking    Time  8    Period  Weeks    Status  On-going      PT LONG TERM GOAL #3   Title  The 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    Time  8    Period  Weeks    Status  On-going      PT LONG TERM GOAL #4   Title  The patient will be able to single leg stand on right at least 8 sec    Time  8    Period  Weeks    Status  On-going      PT LONG TERM GOAL #5   Title  Lumbar extension ROM to 25 degrees and bilateral sidebending to 33 degrees 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  Her FOTO has improved significantly from 47% limitation to 30%.  She reports she has not been compliant with her HEP secondary to her depression and family issues that have been stressful  but she has plans to initiate a group exercise class which starts this week and will restart Silver Sneakers.  Min cues needed for transverse abdominus activation.  Therapist closely monitoring response with all interventions.      Rehab Potential  Good    PT Frequency  2x / week    PT Duration  8 weeks    PT Treatment/Interventions  ADLs/Self Care Home Management;Iontophoresis 53m/ml Dexamethasone;Neuromuscular re-education;Dry  needling;Cryotherapy;Electrical Stimulation;Ultrasound;Moist Heat;Therapeutic activities;Therapeutic exercise;Patient/family education;Manual techniques;Taping    PT Next Visit Plan  assess response to DN # with ES to DN;   reassess progress toward goals to determine readiness for discharge vs. continuation; check SLS;  check lumbar ROM; see how ex class went;  core strengthening;  right hip abduction strengthening;  manual if needed       PHYSICAL THERAPY DISCHARGE SUMMARY  Visits from Start of Care: 9  Current functional level related to goals / functional outcomes: The patient cancelled her last scheduled appt secondary to illness.  She has not returned to PT since her last visit 2/26.  Will discharge from PT at this time with partial goals met.   Remaining deficits: As above   Education / Equipment: HEP Plan:                                                    Patient goals were partially met. Patient is being discharged due to not returning since the last visit.  ?????         Patient will benefit from skilled therapeutic intervention in order to improve the following deficits and impairments:  Pain, Difficulty walking, Decreased strength, Decreased range of motion, Impaired flexibility  Visit Diagnosis: Chronic bilateral low back pain without sciatica  Pain in right hip  Muscle weakness (generalized)  Stiffness of right hip, not elsewhere classified     Problem List Patient Active Problem List   Diagnosis Date Noted  . Abdominal pain, chronic, right lower quadrant 08/13/2013  . Unspecified hypothyroidism 05/06/2013  . Transaminitis 01/18/2012  . Anxiety and depression 11/17/2011  . Preventative health care 11/17/2011  . Osteopenia 11/17/2011  . Chronic insomnia 11/17/2011  . Hyperlipidemia 11/17/2011  . Gastroenteritis 11/17/2011   SRuben Im PT 10/09/17 3:28 PM Phone: [phone]: [phone] SAlvera Singh2/26/2019, 3:27 PM  [facility] [address] [place] [address]: [phone]  Fax:  [phone] Name: Megan FEBOMRN: [id number] of Birth: 8Dec 07, 1957

## 2017-10-15 MED FILL — FLUTICASONE PROP 50 MCG SPR: 50 | 30 days supply | Qty: 16 | Fill #1

## 2017-10-19 ENCOUNTER — Ambulatory Visit: Payer: 59 | Admitting: Physical Therapy

## 2017-10-29 MED FILL — SERTRALINE HCL 25 MG TABS: 25 | 30 days supply | Qty: 30 | Fill #0

## 2017-10-31 DIAGNOSIS — M5441 Lumbago with sciatica, right side: Secondary | ICD-10-CM | POA: Diagnosis not present

## 2017-10-31 MED FILL — CELECOXIB 200 MG CAPSULE: 200 | 90 days supply | Qty: 90 | Fill #0

## 2017-11-09 DIAGNOSIS — R0602 Shortness of breath: Secondary | ICD-10-CM | POA: Diagnosis not present

## 2017-11-09 DIAGNOSIS — R635 Abnormal weight gain: Secondary | ICD-10-CM | POA: Diagnosis not present

## 2017-11-09 MED FILL — ROSUVASTATIN CALCIUM 10 MG: 10 | 90 days supply | Qty: 90 | Fill #0

## 2017-11-09 MED FILL — PHENTERMINE 37.5 MG TABLET: 37.5 | 30 days supply | Qty: 30 | Fill #0

## 2017-11-16 DIAGNOSIS — M545 Low back pain: Secondary | ICD-10-CM | POA: Diagnosis not present

## 2017-11-16 DIAGNOSIS — M1611 Unilateral primary osteoarthritis, right hip: Secondary | ICD-10-CM | POA: Diagnosis not present

## 2017-11-21 MED FILL — CHLORDIAZEPOXIDE 10 MG CAP: 10 | 90 days supply | Qty: 270 | Fill #1

## 2017-11-21 MED FILL — traZODone HCL 50 MG TABS: 50 | 90 days supply | Qty: 270 | Fill #2

## 2017-11-22 MED FILL — SERTRALINE HCL 25 MG TABS: 25 | 30 days supply | Qty: 30 | Fill #1

## 2017-11-22 MED FILL — PANTOPRAZOLE SOD DR 40 MG T: 40 | 90 days supply | Qty: 90 | Fill #0

## 2017-12-07 DIAGNOSIS — R635 Abnormal weight gain: Secondary | ICD-10-CM | POA: Diagnosis not present

## 2017-12-10 DIAGNOSIS — H524 Presbyopia: Secondary | ICD-10-CM | POA: Diagnosis not present

## 2017-12-10 DIAGNOSIS — H5213 Myopia, bilateral: Secondary | ICD-10-CM | POA: Diagnosis not present

## 2018-01-02 MED FILL — FLUTICASONE PROP 50 MCG SPR: 50 | 30 days supply | Qty: 16 | Fill #2

## 2018-01-02 MED FILL — SERTRALINE HCL 25 MG TABS: 25 | 30 days supply | Qty: 30 | Fill #2

## 2018-01-08 MED FILL — TOPIRAMATE 50 MG TABLET: 50 | 30 days supply | Qty: 30 | Fill #0

## 2018-01-11 DIAGNOSIS — F4322 Adjustment disorder with anxiety: Secondary | ICD-10-CM | POA: Diagnosis not present

## 2018-01-11 DIAGNOSIS — F411 Generalized anxiety disorder: Secondary | ICD-10-CM | POA: Diagnosis not present

## 2018-01-11 MED FILL — SERTRALINE HCL 50 MG TABS: 50 | 90 days supply | Qty: 90 | Fill #0

## 2018-01-11 MED FILL — buPROPion HCL ER (XL) 150 M: 150 | 90 days supply | Qty: 90 | Fill #0

## 2018-01-28 DIAGNOSIS — M62838 Other muscle spasm: Secondary | ICD-10-CM | POA: Diagnosis not present

## 2018-01-28 DIAGNOSIS — M9905 Segmental and somatic dysfunction of pelvic region: Secondary | ICD-10-CM | POA: Diagnosis not present

## 2018-01-28 DIAGNOSIS — M9903 Segmental and somatic dysfunction of lumbar region: Secondary | ICD-10-CM | POA: Diagnosis not present

## 2018-01-28 DIAGNOSIS — M5408 Panniculitis affecting regions of neck and back, sacral and sacrococcygeal region: Secondary | ICD-10-CM | POA: Diagnosis not present

## 2018-03-07 ENCOUNTER — Other Ambulatory Visit: Payer: Self-pay | Admitting: Family Medicine

## 2018-03-07 DIAGNOSIS — N6310 Unspecified lump in the right breast, unspecified quadrant: Secondary | ICD-10-CM

## 2018-03-12 ENCOUNTER — Ambulatory Visit
Admission: RE | Admit: 2018-03-12 | Discharge: 2018-03-12 | Disposition: A | Discharge status: Home / Self Care | Payer: 59 | Source: Ambulatory Visit | Attending: Family Medicine | Admitting: Family Medicine

## 2018-03-12 DIAGNOSIS — N6310 Unspecified lump in the right breast, unspecified quadrant: Secondary | ICD-10-CM

## 2018-03-12 DIAGNOSIS — R922 Inconclusive mammogram: Secondary | ICD-10-CM | POA: Diagnosis not present

## 2018-03-12 DIAGNOSIS — N6001 Solitary cyst of right breast: Secondary | ICD-10-CM | POA: Diagnosis not present

## 2018-04-01 MED FILL — CHLORDIAZEPOXIDE 10 MG CAP: 10 | 90 days supply | Qty: 270 | Fill #0

## 2018-04-01 MED FILL — traZODone HCL 50 MG TABS: 50 | 90 days supply | Qty: 270 | Fill #0

## 2018-04-16 DIAGNOSIS — R6882 Decreased libido: Secondary | ICD-10-CM | POA: Diagnosis not present

## 2018-04-16 DIAGNOSIS — R5383 Other fatigue: Secondary | ICD-10-CM | POA: Diagnosis not present

## 2018-04-16 DIAGNOSIS — E559 Vitamin D deficiency, unspecified: Secondary | ICD-10-CM | POA: Diagnosis not present

## 2018-04-16 DIAGNOSIS — Z131 Encounter for screening for diabetes mellitus: Secondary | ICD-10-CM | POA: Diagnosis not present

## 2018-04-16 DIAGNOSIS — N951 Menopausal and female climacteric states: Secondary | ICD-10-CM | POA: Diagnosis not present

## 2018-04-16 DIAGNOSIS — E785 Hyperlipidemia, unspecified: Secondary | ICD-10-CM | POA: Diagnosis not present

## 2018-04-25 DIAGNOSIS — Z0000 Encounter for general adult medical examination without abnormal findings: Secondary | ICD-10-CM | POA: Diagnosis not present

## 2018-04-25 DIAGNOSIS — M1990 Unspecified osteoarthritis, unspecified site: Secondary | ICD-10-CM | POA: Diagnosis not present

## 2018-05-02 MED FILL — SERTRALINE HCL 50 MG TABLET: 50 | 90 days supply | Qty: 90 | Fill #1

## 2018-05-06 MED FILL — ROSUVASTATIN CALCIUM 10 MG: 10 | 90 days supply | Qty: 90 | Fill #1

## 2018-05-06 MED FILL — PANTOPRAZOLE SOD DR 40 MG T: 40 | 90 days supply | Qty: 90 | Fill #1

## 2018-05-06 MED FILL — FLUTICASONE PROP 50 MCG SPR: 50 | 30 days supply | Qty: 16 | Fill #0

## 2018-05-07 MED FILL — CELECOXIB 200 MG CAPSULE: 200 | 90 days supply | Qty: 90 | Fill #1

## 2018-05-16 MED FILL — VYVANSE 30 MG CAPSULE: 30 | 30 days supply | Qty: 30 | Fill #0

## 2018-05-30 DIAGNOSIS — N951 Menopausal and female climacteric states: Secondary | ICD-10-CM | POA: Diagnosis not present

## 2018-05-30 DIAGNOSIS — E785 Hyperlipidemia, unspecified: Secondary | ICD-10-CM | POA: Diagnosis not present

## 2018-05-30 DIAGNOSIS — R6882 Decreased libido: Secondary | ICD-10-CM | POA: Diagnosis not present

## 2018-05-30 DIAGNOSIS — K219 Gastro-esophageal reflux disease without esophagitis: Secondary | ICD-10-CM | POA: Diagnosis not present

## 2018-05-30 MED FILL — ESTRADIOL 0.5 MG TABLET: 0.5 | 30 days supply | Qty: 30 | Fill #0

## 2018-05-30 MED FILL — PROGESTERONE 100 MG CAPSULE: 100 | 30 days supply | Qty: 30 | Fill #0

## 2018-06-05 DIAGNOSIS — I8312 Varicose veins of left lower extremity with inflammation: Secondary | ICD-10-CM | POA: Diagnosis not present

## 2018-06-05 DIAGNOSIS — I8311 Varicose veins of right lower extremity with inflammation: Secondary | ICD-10-CM | POA: Diagnosis not present

## 2018-06-12 DIAGNOSIS — M25562 Pain in left knee: Secondary | ICD-10-CM | POA: Diagnosis not present

## 2018-06-16 DIAGNOSIS — J069 Acute upper respiratory infection, unspecified: Secondary | ICD-10-CM | POA: Diagnosis not present

## 2018-06-16 DIAGNOSIS — J309 Allergic rhinitis, unspecified: Secondary | ICD-10-CM | POA: Diagnosis not present

## 2018-06-19 MED FILL — AZITHROMYCIN 250 MG TABLET: 250 | 5 days supply | Qty: 6 | Fill #0

## 2018-06-20 DIAGNOSIS — I8312 Varicose veins of left lower extremity with inflammation: Secondary | ICD-10-CM | POA: Diagnosis not present

## 2018-06-20 DIAGNOSIS — I8311 Varicose veins of right lower extremity with inflammation: Secondary | ICD-10-CM | POA: Diagnosis not present

## 2018-06-27 MED FILL — buPROPion HCL ER (XL) 150 M: 150 | 90 days supply | Qty: 90 | Fill #1

## 2018-07-01 MED FILL — PROGESTERONE 100 MG CAPSULE: 100 | 30 days supply | Qty: 30 | Fill #0

## 2018-07-01 MED FILL — ESTRADIOL 0.5 MG TABLET: 0.5 | 30 days supply | Qty: 30 | Fill #0

## 2018-07-09 ENCOUNTER — Other Ambulatory Visit: Payer: Self-pay | Admitting: Sports Medicine

## 2018-07-09 DIAGNOSIS — M25562 Pain in left knee: Secondary | ICD-10-CM

## 2018-07-10 DIAGNOSIS — N951 Menopausal and female climacteric states: Secondary | ICD-10-CM | POA: Diagnosis not present

## 2018-07-10 DIAGNOSIS — E785 Hyperlipidemia, unspecified: Secondary | ICD-10-CM | POA: Diagnosis not present

## 2018-07-10 DIAGNOSIS — R5383 Other fatigue: Secondary | ICD-10-CM | POA: Diagnosis not present

## 2018-07-10 DIAGNOSIS — Z713 Dietary counseling and surveillance: Secondary | ICD-10-CM | POA: Diagnosis not present

## 2018-07-10 DIAGNOSIS — R6882 Decreased libido: Secondary | ICD-10-CM | POA: Diagnosis not present

## 2018-07-15 MED FILL — ESTRADIOL 1 MG TABS: 1 | 30 days supply | Qty: 30 | Fill #0

## 2018-07-15 MED FILL — PROGESTERONE 200 MG CAPSULE: 200 | 30 days supply | Qty: 30 | Fill #0

## 2018-07-22 ENCOUNTER — Other Ambulatory Visit: Payer: Self-pay

## 2018-07-24 DIAGNOSIS — I8311 Varicose veins of right lower extremity with inflammation: Secondary | ICD-10-CM | POA: Diagnosis not present

## 2018-07-24 DIAGNOSIS — I8312 Varicose veins of left lower extremity with inflammation: Secondary | ICD-10-CM | POA: Diagnosis not present

## 2018-07-24 MED FILL — buPROPion HCL ER (XL) 300 M: 300 | 90 days supply | Qty: 90 | Fill #0

## 2018-07-24 MED FILL — VYVANSE 30 MG CAPSULE: 30 | 30 days supply | Qty: 30 | Fill #0

## 2018-07-25 MED FILL — traZODone HCL 50 MG TABS: 50 | 90 days supply | Qty: 270 | Fill #1

## 2018-08-03 ENCOUNTER — Ambulatory Visit
Admission: RE | Admit: 2018-08-03 | Discharge: 2018-08-03 | Disposition: A | Discharge status: Home / Self Care | Payer: 59 | Source: Ambulatory Visit | Attending: Sports Medicine | Admitting: Sports Medicine

## 2018-08-03 DIAGNOSIS — M23322 Other meniscus derangements, posterior horn of medial meniscus, left knee: Secondary | ICD-10-CM | POA: Diagnosis not present

## 2018-08-03 DIAGNOSIS — M25562 Pain in left knee: Secondary | ICD-10-CM

## 2018-09-02 MED FILL — ESTRADIOL 1 MG TABS: 1 | 30 days supply | Qty: 30 | Fill #1

## 2018-09-02 MED FILL — FLUTICASONE PROP 50 MCG SPR: 50 | 30 days supply | Qty: 16 | Fill #1

## 2018-09-02 MED FILL — PROGESTERONE 200 MG CAPSULE: 200 | 30 days supply | Qty: 30 | Fill #1

## 2018-09-04 DIAGNOSIS — M25562 Pain in left knee: Secondary | ICD-10-CM | POA: Diagnosis not present

## 2018-09-04 DIAGNOSIS — M25362 Other instability, left knee: Secondary | ICD-10-CM | POA: Diagnosis not present

## 2018-09-16 DIAGNOSIS — E785 Hyperlipidemia, unspecified: Secondary | ICD-10-CM | POA: Diagnosis not present

## 2018-09-16 DIAGNOSIS — Z713 Dietary counseling and surveillance: Secondary | ICD-10-CM | POA: Diagnosis not present

## 2018-09-16 DIAGNOSIS — R6882 Decreased libido: Secondary | ICD-10-CM | POA: Diagnosis not present

## 2018-09-16 DIAGNOSIS — N951 Menopausal and female climacteric states: Secondary | ICD-10-CM | POA: Diagnosis not present

## 2018-09-16 DIAGNOSIS — R5383 Other fatigue: Secondary | ICD-10-CM | POA: Diagnosis not present

## 2018-09-16 MED FILL — NP THYROID 30 MG TABLET: 30 | 30 days supply | Qty: 30 | Fill #0

## 2018-09-17 DIAGNOSIS — S83242A Other tear of medial meniscus, current injury, left knee, initial encounter: Secondary | ICD-10-CM | POA: Diagnosis not present

## 2018-09-17 DIAGNOSIS — M25562 Pain in left knee: Secondary | ICD-10-CM | POA: Diagnosis not present

## 2018-09-17 MED FILL — BD NEEDLE 23GX1": 23G X 1" | 90 days supply | Qty: 100 | Fill #0

## 2018-09-17 MED FILL — BD TB SYRINGE 25GX5/8": 25G X 5/8" | 90 days supply | Qty: 100 | Fill #0

## 2018-09-17 MED FILL — BD TB SYRINGE 25GX5/8: 25G X 5/8" | 90 days supply | Qty: 100 | Fill #0

## 2018-09-17 MED FILL — BD NEEDLE 23GX1: 23G X 1" | 90 days supply | Qty: 100 | Fill #0

## 2018-09-27 MED FILL — ESTRADIOL 1 MG TABS: 1 | 30 days supply | Qty: 45 | Fill #0

## 2018-09-27 MED FILL — PROGESTERONE 200 MG CAPSULE: 200 | 30 days supply | Qty: 60 | Fill #0

## 2018-10-03 DIAGNOSIS — I83813 Varicose veins of bilateral lower extremities with pain: Secondary | ICD-10-CM | POA: Diagnosis not present

## 2018-10-03 DIAGNOSIS — R202 Paresthesia of skin: Secondary | ICD-10-CM | POA: Diagnosis not present

## 2018-10-10 DIAGNOSIS — S83232A Complex tear of medial meniscus, current injury, left knee, initial encounter: Secondary | ICD-10-CM | POA: Diagnosis not present

## 2018-10-10 DIAGNOSIS — M7122 Synovial cyst of popliteal space [Baker], left knee: Secondary | ICD-10-CM | POA: Diagnosis not present

## 2018-10-10 DIAGNOSIS — S83272A Complex tear of lateral meniscus, current injury, left knee, initial encounter: Secondary | ICD-10-CM | POA: Diagnosis not present

## 2018-10-10 DIAGNOSIS — G8918 Other acute postprocedural pain: Secondary | ICD-10-CM | POA: Diagnosis not present

## 2018-10-10 DIAGNOSIS — M94262 Chondromalacia, left knee: Secondary | ICD-10-CM | POA: Diagnosis not present

## 2018-10-10 MED FILL — CEPHALEXIN 250 MG CAPS: 250 | 3 days supply | Qty: 10 | Fill #0

## 2018-10-10 MED FILL — HYDROCODON-APAP 5-325: 5-325 | 5 days supply | Qty: 30 | Fill #0

## 2018-10-12 ENCOUNTER — Encounter: Payer: Self-pay | Admitting: Gastroenterology

## 2018-10-18 DIAGNOSIS — M25562 Pain in left knee: Secondary | ICD-10-CM | POA: Diagnosis not present

## 2018-10-18 MED FILL — AZITHROMYCIN 250 MG TABLET: 250 | 5 days supply | Qty: 6 | Fill #0

## 2018-10-21 MED FILL — NP THYROID 30 MG TABLET: 30 | 30 days supply | Qty: 30 | Fill #1

## 2018-10-21 MED FILL — PANTOPRAZOLE SOD DR 40 MG T: 40 | 90 days supply | Qty: 90 | Fill #0

## 2018-11-01 ENCOUNTER — Encounter (INDEPENDENT_AMBULATORY_CARE_PROVIDER_SITE_OTHER): Payer: Self-pay | Admitting: Internal Medicine

## 2018-11-06 MED FILL — tiZANidine HCL 4 MG TABS: 4 | 30 days supply | Qty: 90 | Fill #0

## 2018-11-06 MED FILL — ROSUVASTATIN CALCIUM 10 MG: 10 | 90 days supply | Qty: 90 | Fill #0

## 2018-11-06 MED FILL — buPROPion HCL ER (XL) 150 M: 150 | 90 days supply | Qty: 90 | Fill #2

## 2018-11-11 MED FILL — predniSONE 10 MG TABS: 10 | 12 days supply | Qty: 48 | Fill #0

## 2018-12-16 DIAGNOSIS — H5213 Myopia, bilateral: Secondary | ICD-10-CM | POA: Diagnosis not present

## 2018-12-20 MED FILL — FLUTICASONE PROP 50 MCG SPR: 50 | 30 days supply | Qty: 16 | Fill #2

## 2018-12-20 MED FILL — traZODone HCL 50 MG TABS: 50 | 90 days supply | Qty: 270 | Fill #2

## 2018-12-24 DIAGNOSIS — M25551 Pain in right hip: Secondary | ICD-10-CM | POA: Diagnosis not present

## 2018-12-24 DIAGNOSIS — M25552 Pain in left hip: Secondary | ICD-10-CM | POA: Diagnosis not present

## 2018-12-26 MED FILL — CELECOXIB 200 MG CAPS: 200 | 90 days supply | Qty: 90 | Fill #0

## 2019-02-06 DIAGNOSIS — F3342 Major depressive disorder, recurrent, in full remission: Secondary | ICD-10-CM | POA: Diagnosis not present

## 2019-02-06 DIAGNOSIS — F411 Generalized anxiety disorder: Secondary | ICD-10-CM | POA: Diagnosis not present

## 2019-02-06 DIAGNOSIS — F900 Attention-deficit hyperactivity disorder, predominantly inattentive type: Secondary | ICD-10-CM | POA: Diagnosis not present

## 2019-02-06 MED FILL — buPROPion HCL ER (XL) 300 M: 300 | 90 days supply | Qty: 90 | Fill #0

## 2019-02-06 MED FILL — VYVANSE 30 MG CAPSULE: 30 | 90 days supply | Qty: 90 | Fill #0

## 2019-02-24 DIAGNOSIS — I8312 Varicose veins of left lower extremity with inflammation: Secondary | ICD-10-CM | POA: Diagnosis not present

## 2019-02-24 DIAGNOSIS — I8311 Varicose veins of right lower extremity with inflammation: Secondary | ICD-10-CM | POA: Diagnosis not present

## 2019-03-26 DIAGNOSIS — M4317 Spondylolisthesis, lumbosacral region: Secondary | ICD-10-CM | POA: Diagnosis not present

## 2019-03-26 DIAGNOSIS — M47816 Spondylosis without myelopathy or radiculopathy, lumbar region: Secondary | ICD-10-CM | POA: Diagnosis not present

## 2019-04-16 ENCOUNTER — Other Ambulatory Visit: Payer: Self-pay | Admitting: Geriatric Medicine

## 2019-04-16 ENCOUNTER — Other Ambulatory Visit: Payer: Self-pay | Admitting: Family Medicine

## 2019-04-16 DIAGNOSIS — Z1231 Encounter for screening mammogram for malignant neoplasm of breast: Secondary | ICD-10-CM

## 2019-04-22 DIAGNOSIS — I8312 Varicose veins of left lower extremity with inflammation: Secondary | ICD-10-CM | POA: Diagnosis not present

## 2019-04-22 MED FILL — traZODone HCL 50 MG TABS: 50 | 90 days supply | Qty: 270 | Fill #0

## 2019-05-02 DIAGNOSIS — H01004 Unspecified blepharitis left upper eyelid: Secondary | ICD-10-CM | POA: Diagnosis not present

## 2019-05-02 DIAGNOSIS — H01001 Unspecified blepharitis right upper eyelid: Secondary | ICD-10-CM | POA: Diagnosis not present

## 2019-05-02 DIAGNOSIS — H04123 Dry eye syndrome of bilateral lacrimal glands: Secondary | ICD-10-CM | POA: Diagnosis not present

## 2019-05-02 MED FILL — LOTEMAX SM 0.38 % GEL: 0.38 | 18 days supply | Qty: 5 | Fill #0

## 2019-05-06 DIAGNOSIS — R7303 Prediabetes: Secondary | ICD-10-CM | POA: Diagnosis not present

## 2019-05-06 DIAGNOSIS — Z79899 Other long term (current) drug therapy: Secondary | ICD-10-CM | POA: Diagnosis not present

## 2019-05-06 DIAGNOSIS — K219 Gastro-esophageal reflux disease without esophagitis: Secondary | ICD-10-CM | POA: Diagnosis not present

## 2019-05-06 DIAGNOSIS — Z0000 Encounter for general adult medical examination without abnormal findings: Secondary | ICD-10-CM | POA: Diagnosis not present

## 2019-05-06 DIAGNOSIS — M545 Low back pain: Secondary | ICD-10-CM | POA: Diagnosis not present

## 2019-05-06 DIAGNOSIS — F3342 Major depressive disorder, recurrent, in full remission: Secondary | ICD-10-CM | POA: Diagnosis not present

## 2019-05-06 DIAGNOSIS — E7800 Pure hypercholesterolemia, unspecified: Secondary | ICD-10-CM | POA: Diagnosis not present

## 2019-05-07 MED FILL — ROSUVASTATIN CALCIUM 10 MG: 10 | 30 days supply | Qty: 30 | Fill #0

## 2019-05-11 DIAGNOSIS — M545 Low back pain: Secondary | ICD-10-CM | POA: Diagnosis not present

## 2019-05-11 DIAGNOSIS — S76312A Strain of muscle, fascia and tendon of the posterior muscle group at thigh level, left thigh, initial encounter: Secondary | ICD-10-CM | POA: Diagnosis not present

## 2019-05-12 MED FILL — CELECOXIB 200 MG CAP: 200 | 90 days supply | Qty: 90 | Fill #0

## 2019-05-22 MED FILL — PANTOPRAZOLE SOD DR 40 MG T: 40 | 90 days supply | Qty: 90 | Fill #0

## 2019-06-03 ENCOUNTER — Ambulatory Visit: Payer: 59

## 2019-06-03 DIAGNOSIS — I8311 Varicose veins of right lower extremity with inflammation: Secondary | ICD-10-CM | POA: Diagnosis not present

## 2019-06-17 DIAGNOSIS — I8312 Varicose veins of left lower extremity with inflammation: Secondary | ICD-10-CM | POA: Diagnosis not present

## 2019-06-25 DIAGNOSIS — H57813 Brow ptosis, bilateral: Secondary | ICD-10-CM | POA: Diagnosis not present

## 2019-07-15 DIAGNOSIS — I8311 Varicose veins of right lower extremity with inflammation: Secondary | ICD-10-CM | POA: Diagnosis not present

## 2019-07-31 MED FILL — ROSUVASTATIN CALCIUM 10 MG: 10 | 30 days supply | Qty: 30 | Fill #1

## 2019-07-31 MED FILL — traZODone HCL 50 MG TABS: 50 | 90 days supply | Qty: 270 | Fill #1

## 2019-07-31 MED FILL — BUPROPION HCL XL 300 MG TAB: 300 | 90 days supply | Qty: 90 | Fill #1

## 2019-08-04 MED FILL — LOTEMAX SM 0.38 % GEL: 0.38 | 18 days supply | Qty: 5 | Fill #1

## 2019-08-12 DIAGNOSIS — M25562 Pain in left knee: Secondary | ICD-10-CM | POA: Diagnosis not present

## 2019-08-18 ENCOUNTER — Other Ambulatory Visit: Payer: Self-pay

## 2019-08-18 ENCOUNTER — Ambulatory Visit: Payer: 59 | Admitting: Family Medicine

## 2019-08-18 ENCOUNTER — Encounter: Payer: Self-pay | Admitting: Family Medicine

## 2019-08-18 VITALS — BP 102/70 | Ht 70.0 in | Wt 180.0 lb

## 2019-08-18 DIAGNOSIS — M25562 Pain in left knee: Secondary | ICD-10-CM | POA: Diagnosis not present

## 2019-08-18 DIAGNOSIS — G8929 Other chronic pain: Secondary | ICD-10-CM | POA: Diagnosis not present

## 2019-08-18 MED ORDER — METHYLPREDNISOLONE ACETATE 40 MG/ML IJ SUSP
40.0000 mg | Freq: Once | INTRAMUSCULAR | Status: AC
  Administered 2019-08-18: 10:00:00 40 mg via INTRA_ARTICULAR

## 2019-08-18 NOTE — Progress Notes (Signed)
PCP: Lajean Manes, MD  Subjective:   HPI: Patient is a 64 y.o. female here for left knee pain.  Patient had meniscus surgery [id number] - uncertain if this was a repair or debridement. She's continued to have problems with her left knee that feel similar to those experienced prior to surgery. Pain is lateral with some swelling and posterior pain. 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 She was released postop in April. Pain radiates down to ankle. Feels like knee is going to pop out of place with certain movements. Worse getting down on the floor. Has tried celebrex and aleve. No new injuries. Has history of circulation problems with treatment of varicosities  Past Medical History:  Diagnosis Date  . Anxiety   . GERD (gastroesophageal reflux disease)   . Heart murmur   . Hyperlipidemia   . Insomnia   . Mitral valve regurgitation    mild  . Sinusitis     Current Outpatient Medications on File Prior to Visit  Medication Sig Dispense Refill  . Biotin 10 MG CAPS Take by mouth.    Marland Kitchen buPROPion (WELLBUTRIN XL) 150 MG 24 hr tablet bupropion HCl XL 150 mg 24 hr tablet, extended release    . Calcium-Magnesium-Vitamin D (CALCIUM MAGNESIUM PO) Take 1 tablet by mouth daily.    . chlordiazePOXIDE (LIBRIUM) 10 MG capsule Take 10 mg by mouth daily.    . Cholecalciferol (VITAMIN D3) 10000 UNITS capsule Take 10,000 Units by mouth daily.    . fluticasone (FLONASE) 50 MCG/ACT nasal spray INSTILL 2 SPRAYS INTO EACH NOSTRIL ONCE DAILY 16 g 0  . glucosamine-chondroitin 500-400 MG tablet Take 1 tablet by mouth daily. Takes Osteo bi-flex; 1-2 a day    . Menaquinone-7 (VITAMIN K2 PO) Take 1 tablet by mouth daily.    . Multiple Vitamin (MULTIVITAMIN) tablet Take 1 tablet by mouth daily.      . progesterone (PROMETRIUM) 100 MG capsule Take 1 capsule (100 mg total) by mouth daily. 90 capsule 3  . UNABLE TO FIND Estrogen pellets     No current  facility-administered medications on file prior to visit.    Past Surgical History:  Procedure Laterality Date  . BUNIONECTOMY     Right foot   . DILATION AND CURETTAGE OF UTERUS      No Known Allergies  Social History   Socioeconomic History  . Marital status: Divorced    Spouse name: Not on file  . Number of children: 3  . Years of education: Not on file  . Highest education level: Not on file  Occupational History  . Occupation: Programmer, multimedia: [employer]  Tobacco Use  . Smoking status: Former Research scientist (life sciences)  . Smokeless tobacco: Never Used  Substance and Sexual Activity  . Alcohol use: Yes    Comment: 1-2 drinks daily   . Drug use: No  . Sexual activity: Not on file  Other Topics Concern  . Not on file  Social History Narrative   Daily caffeine    Social Determinants of Health   Financial Resource Strain:   . Difficulty of Paying Living Expenses: Not on file  Food Insecurity:   . Worried About Charity fundraiser in the Last Year: Not on file  . Ran Out of Food in the Last Year: Not on file  Transportation Needs:   . Lack of Transportation (Medical): Not on file  . Lack of Transportation (  Non-Medical): Not on file  Physical Activity:   . Days of Exercise per Week: Not on file  . Minutes of Exercise per Session: Not on file  Stress:   . Feeling of Stress : Not on file  Social Connections:   . Frequency of Communication with Friends and Family: Not on file  . Frequency of Social Gatherings with Friends and Family: Not on file  . Attends Religious Services: Not on file  . Active Member of Clubs or Organizations: Not on file  . Attends Archivist Meetings: Not on file  . Marital Status: Not on file  Intimate Partner Violence:   . Fear of Current or Ex-Partner: Not on file  . Emotionally Abused: Not on file  . Physically Abused: Not on file  . Sexually Abused: Not on file    Family History  Problem Relation Age of Onset  . Esophageal cancer Father    . Stomach cancer Father   . Diabetes Mother   . Heart disease Sister   . Colon cancer Neg Hx   . Rectal cancer Neg Hx     BP 102/70   Ht 5\' 10"  (1.778 m)   Wt 180 lb (81.6 kg)   BMI 25.83 kg/m   Review of Systems: See HPI above.     Objective:  Physical Exam:  Gen: NAD, comfortable in exam room  Left knee: No gross deformity, ecchymoses.  Minimal effusion.  No palpable bakers cyst currently. TTP lateral joint line.  No other tenderness. FROM with 5/5 strength flexion and extension. Negative ant/post drawers. Negative valgus/varus testing. Negative lachmans. Negative mcmurrays, apleys, bounce, patellar apprehension.  Feels a pop laterally with thessalys. NV intact distally.   Assessment & Plan:  1. Left knee pain - reviewed her MRI from last year.  She does have degenerative changes in this area and noted mild-moderate arthropathy on today's ultrasound laterally as well.  Likely she had a meniscus debridement and unlikely to have a new tear at this point already - did not notice benefit following arthroscopy either.  Consistent with arthritis.  Steroid injection offered and given today.  Discussed tylenol, topical medications, aleve, supplements that may help.  Knee sleeve, home exercises.  F/u in 6 weeks or prn.  After informed written consent timeout was performed, patient was seated on exam table. Left knee was prepped with alcohol swab and utilizing anteromedial approach, patient's left knee was injected intraarticularly with 3:1 bupivicaine: depomedrol. Patient tolerated the procedure well without immediate complications.

## 2019-08-18 NOTE — Patient Instructions (Signed)
Your pain is due to arthritis, less likely a meniscus tear. These are the different medications you can take for this: Tylenol 500mg  1-2 tabs three times a day for pain. Capsaicin, aspercreme, or biofreeze topically up to four times a day may also help with pain. Some supplements that may help for arthritis: Boswellia extract, curcumin, pycnogenol Aleve 1-2 tabs twice a day with food Cortisone injections are an option - you were given this today. Sleeve when up and walking around for support. If cortisone injections do not help, there are different types of shots that may help but they take longer to take effect. It's important that you continue to stay active. Straight leg raises, knee extensions 3 sets of 10 once a day (add ankle weight if these become too easy). Consider physical therapy to strengthen muscles around the joint that hurts to take pressure off of the joint itself. Shoe inserts with good arch support may be helpful. Heat or ice 15 minutes at a time 3-4 times a day as needed to help with pain. Water aerobics and cycling with low resistance are the best two types of exercise for arthritis though any exercise is ok as long as it doesn't worsen the pain. Follow up with me in 6 weeks or as needed if you're doing well.

## 2019-09-17 MED FILL — ROSUVASTATIN CALCIUM 10 MG: 10 | 30 days supply | Qty: 30 | Fill #2

## 2019-10-02 MED FILL — FLUTICASONE PROP 50 MCG SPR: 50 | 30 days supply | Qty: 16 | Fill #0

## 2019-10-24 MED FILL — ROSUVASTATIN CALCIUM 10 MG: 10 | 30 days supply | Qty: 30 | Fill #3

## 2019-10-30 MED FILL — buPROPion HCL ER (XL) 300 M: 300 | 90 days supply | Qty: 90 | Fill #2

## 2019-10-30 MED FILL — traZODone HCL 50 MG TABS: 50 | 90 days supply | Qty: 270 | Fill #2

## 2019-11-26 ENCOUNTER — Other Ambulatory Visit (HOSPITAL_COMMUNITY): Payer: Self-pay | Admitting: Specialist

## 2019-11-26 DIAGNOSIS — F411 Generalized anxiety disorder: Secondary | ICD-10-CM | POA: Diagnosis not present

## 2019-11-26 DIAGNOSIS — F900 Attention-deficit hyperactivity disorder, predominantly inattentive type: Secondary | ICD-10-CM | POA: Diagnosis not present

## 2019-11-26 DIAGNOSIS — F3342 Major depressive disorder, recurrent, in full remission: Secondary | ICD-10-CM | POA: Diagnosis not present

## 2019-11-27 MED FILL — ROSUVASTATIN CALCIUM 10 MG: 10 | 30 days supply | Qty: 30 | Fill #4

## 2019-11-27 MED FILL — VYVANSE 30 MG CAPSULE: 30 | 30 days supply | Qty: 30 | Fill #0

## 2019-12-03 DIAGNOSIS — I340 Nonrheumatic mitral (valve) insufficiency: Secondary | ICD-10-CM | POA: Diagnosis not present

## 2019-12-03 DIAGNOSIS — R0600 Dyspnea, unspecified: Secondary | ICD-10-CM | POA: Diagnosis not present

## 2019-12-03 DIAGNOSIS — M791 Myalgia, unspecified site: Secondary | ICD-10-CM | POA: Diagnosis not present

## 2019-12-03 DIAGNOSIS — R7303 Prediabetes: Secondary | ICD-10-CM | POA: Diagnosis not present

## 2019-12-03 DIAGNOSIS — E7800 Pure hypercholesterolemia, unspecified: Secondary | ICD-10-CM | POA: Diagnosis not present

## 2019-12-03 DIAGNOSIS — Z79899 Other long term (current) drug therapy: Secondary | ICD-10-CM | POA: Diagnosis not present

## 2019-12-03 DIAGNOSIS — E039 Hypothyroidism, unspecified: Secondary | ICD-10-CM | POA: Diagnosis not present

## 2019-12-10 DIAGNOSIS — R0600 Dyspnea, unspecified: Secondary | ICD-10-CM | POA: Diagnosis not present

## 2019-12-17 DIAGNOSIS — I8312 Varicose veins of left lower extremity with inflammation: Secondary | ICD-10-CM | POA: Diagnosis not present

## 2019-12-18 ENCOUNTER — Other Ambulatory Visit: Payer: Self-pay

## 2019-12-18 ENCOUNTER — Ambulatory Visit
Admission: RE | Admit: 2019-12-18 | Discharge: 2019-12-18 | Disposition: A | Discharge status: Home / Self Care | Payer: 59 | Source: Ambulatory Visit | Attending: Geriatric Medicine | Admitting: Geriatric Medicine

## 2019-12-18 ENCOUNTER — Other Ambulatory Visit: Payer: Self-pay | Admitting: Geriatric Medicine

## 2019-12-18 DIAGNOSIS — R0789 Other chest pain: Secondary | ICD-10-CM | POA: Diagnosis not present

## 2019-12-18 DIAGNOSIS — R0609 Other forms of dyspnea: Secondary | ICD-10-CM

## 2019-12-18 DIAGNOSIS — R0600 Dyspnea, unspecified: Secondary | ICD-10-CM | POA: Diagnosis not present

## 2019-12-24 DIAGNOSIS — M25551 Pain in right hip: Secondary | ICD-10-CM | POA: Diagnosis not present

## 2019-12-24 DIAGNOSIS — M25552 Pain in left hip: Secondary | ICD-10-CM | POA: Diagnosis not present

## 2019-12-31 DIAGNOSIS — M25551 Pain in right hip: Secondary | ICD-10-CM | POA: Diagnosis not present

## 2019-12-31 DIAGNOSIS — M25552 Pain in left hip: Secondary | ICD-10-CM | POA: Diagnosis not present

## 2020-01-05 MED FILL — ROSUVASTATIN CALCIUM 10 MG: 10 | 30 days supply | Qty: 30 | Fill #5

## 2020-01-05 MED FILL — PANTOPRAZOLE SOD DR 40 MG T: 40 | 90 days supply | Qty: 90 | Fill #1

## 2020-02-11 MED FILL — ROSUVASTATIN CALCIUM 10 MG: 10 | 30 days supply | Qty: 30 | Fill #6

## 2020-02-25 DIAGNOSIS — I8311 Varicose veins of right lower extremity with inflammation: Secondary | ICD-10-CM | POA: Diagnosis not present

## 2020-03-02 MED FILL — VYVANSE 40 MG CAPSULE: 40 | 30 days supply | Qty: 30 | Fill #0

## 2020-03-14 DIAGNOSIS — M79672 Pain in left foot: Secondary | ICD-10-CM | POA: Diagnosis not present

## 2020-03-17 ENCOUNTER — Ambulatory Visit
Admission: RE | Admit: 2020-03-17 | Discharge: 2020-03-17 | Disposition: A | Discharge status: Home / Self Care | Payer: Medicare Other | Source: Ambulatory Visit | Attending: Geriatric Medicine | Admitting: Geriatric Medicine

## 2020-03-17 ENCOUNTER — Other Ambulatory Visit: Payer: Self-pay

## 2020-03-17 DIAGNOSIS — Z1231 Encounter for screening mammogram for malignant neoplasm of breast: Secondary | ICD-10-CM

## 2020-03-29 MED FILL — ROSUVASTATIN CALCIUM 10 MG: 10 | 30 days supply | Qty: 30 | Fill #7

## 2020-04-05 MED FILL — traZODone HCL 50 MG TABS: 50 | 90 days supply | Qty: 270 | Fill #0

## 2020-04-05 MED FILL — buPROPion HCL ER (XL) 300 M: 300 | 90 days supply | Qty: 90 | Fill #0

## 2020-05-14 ENCOUNTER — Other Ambulatory Visit (HOSPITAL_COMMUNITY): Payer: Self-pay | Admitting: Geriatric Medicine

## 2020-05-14 MED FILL — ROSUVASTATIN CALCIUM 10 MG: 10 | 30 days supply | Qty: 30 | Fill #0

## 2020-06-10 MED FILL — DEXMETHYLPHENIDATE HCL ER 2: 20 | 90 days supply | Qty: 90 | Fill #0

## 2020-06-15 MED FILL — FLUTICASONE PROP 50 MCG SPR: 50 | 30 days supply | Qty: 16 | Fill #1

## 2020-07-01 MED FILL — ROSUVASTATIN CALCIUM 10 MG: 10 | 30 days supply | Qty: 30 | Fill #1

## 2020-07-14 ENCOUNTER — Other Ambulatory Visit (HOSPITAL_COMMUNITY): Payer: Self-pay | Admitting: Geriatric Medicine

## 2020-07-14 MED FILL — PANTOPRAZOLE SOD DR 40 MG T: 40 | 90 days supply | Qty: 90 | Fill #0

## 2020-08-03 MED FILL — ROSUVASTATIN CALCIUM 10 MG: 10 | 30 days supply | Qty: 30 | Fill #2

## 2020-08-03 MED FILL — buPROPion HCL ER (XL) 300 M: 300 | 90 days supply | Qty: 90 | Fill #1

## 2020-08-03 MED FILL — traZODone HCL 50 MG TABS: 50 | 90 days supply | Qty: 270 | Fill #1

## 2020-08-18 DIAGNOSIS — M25562 Pain in left knee: Secondary | ICD-10-CM | POA: Diagnosis not present

## 2020-08-18 DIAGNOSIS — Z888 Allergy status to other drugs, medicaments and biological substances status: Secondary | ICD-10-CM | POA: Diagnosis not present

## 2020-08-18 DIAGNOSIS — M1712 Unilateral primary osteoarthritis, left knee: Secondary | ICD-10-CM | POA: Diagnosis not present

## 2020-08-18 DIAGNOSIS — G8929 Other chronic pain: Secondary | ICD-10-CM | POA: Diagnosis not present

## 2020-08-18 DIAGNOSIS — M7632 Iliotibial band syndrome, left leg: Secondary | ICD-10-CM | POA: Diagnosis not present

## 2020-09-02 DIAGNOSIS — M7632 Iliotibial band syndrome, left leg: Secondary | ICD-10-CM | POA: Diagnosis not present

## 2020-09-02 DIAGNOSIS — M25562 Pain in left knee: Secondary | ICD-10-CM | POA: Diagnosis not present

## 2020-09-02 DIAGNOSIS — R262 Difficulty in walking, not elsewhere classified: Secondary | ICD-10-CM | POA: Diagnosis not present

## 2020-09-02 DIAGNOSIS — G8929 Other chronic pain: Secondary | ICD-10-CM | POA: Diagnosis not present

## 2020-09-02 DIAGNOSIS — Z7409 Other reduced mobility: Secondary | ICD-10-CM | POA: Diagnosis not present

## 2020-09-08 ENCOUNTER — Other Ambulatory Visit (HOSPITAL_COMMUNITY): Payer: Self-pay | Admitting: Ophthalmology

## 2020-09-08 DIAGNOSIS — H524 Presbyopia: Secondary | ICD-10-CM | POA: Diagnosis not present

## 2020-09-08 DIAGNOSIS — H01001 Unspecified blepharitis right upper eyelid: Secondary | ICD-10-CM | POA: Diagnosis not present

## 2020-09-08 DIAGNOSIS — H04123 Dry eye syndrome of bilateral lacrimal glands: Secondary | ICD-10-CM | POA: Diagnosis not present

## 2020-09-08 DIAGNOSIS — H2513 Age-related nuclear cataract, bilateral: Secondary | ICD-10-CM | POA: Diagnosis not present

## 2020-09-08 MED FILL — FLUOROMETHOLONE 0.1% DROPS: 0.1 | 25 days supply | Qty: 5 | Fill #0

## 2020-09-14 MED FILL — ROSUVASTATIN CALCIUM 10 MG: 10 | 30 days supply | Qty: 30 | Fill #3

## 2020-09-16 DIAGNOSIS — R1031 Right lower quadrant pain: Secondary | ICD-10-CM | POA: Diagnosis not present

## 2020-09-16 DIAGNOSIS — E7800 Pure hypercholesterolemia, unspecified: Secondary | ICD-10-CM | POA: Diagnosis not present

## 2020-09-29 ENCOUNTER — Other Ambulatory Visit: Payer: Self-pay

## 2020-09-29 ENCOUNTER — Ambulatory Visit (HOSPITAL_BASED_OUTPATIENT_CLINIC_OR_DEPARTMENT_OTHER): Payer: Medicare Other | Attending: Adult Reconstructive Orthopaedic Surgery | Admitting: Physical Therapy

## 2020-09-29 DIAGNOSIS — G8929 Other chronic pain: Secondary | ICD-10-CM | POA: Diagnosis not present

## 2020-09-29 DIAGNOSIS — M25562 Pain in left knee: Secondary | ICD-10-CM | POA: Insufficient documentation

## 2020-09-29 DIAGNOSIS — R2681 Unsteadiness on feet: Secondary | ICD-10-CM | POA: Diagnosis not present

## 2020-09-29 DIAGNOSIS — M6281 Muscle weakness (generalized): Secondary | ICD-10-CM | POA: Insufficient documentation

## 2020-09-29 NOTE — Patient Instructions (Signed)
Date: 09/29/2020 Prepared by: Estill Bamberg April Thurnell Garbe  Exercises Wall Quarter Squat - 1 x daily - 7 x weekly - 2 sets - 5 reps - 1 min hold Standing Isometric Hip Abduction with Mini Squat on Wall - 1 x daily - 7 x weekly - 2 sets - 5 reps - 10 sec hold Standing Heel Raise - 1 x daily - 7 x weekly - 2 sets - 10 reps

## 2020-09-29 NOTE — Therapy (Signed)
[place], [place], [address] Phone: [phone]   Fax:  [phone]  Physical Therapy Evaluation  Patient Details  Name: Megan Greer MRN: [id number] Date of Birth: October 04, 1955 Referring Provider (PT): Megan Ashing, MD   Encounter Date: 09/29/2020   PT End of Session - 09/29/20 1604    Visit Number 1    Number of Visits 6    Date for PT Re-Evaluation 11/10/20    Authorization Type [employer] Medicare    Progress Note Due on Visit 10    PT Start Time 1515    PT Stop Time 1600    PT Time Calculation (min) 45 min    Activity Tolerance Patient tolerated treatment well    Behavior During Therapy Megan Greer for tasks assessed/performed           Past Medical History:  Diagnosis Date  . Anxiety   . GERD (gastroesophageal reflux disease)   . Heart murmur   . Hyperlipidemia   . Insomnia   . Mitral valve regurgitation    mild  . Sinusitis     Past Surgical History:  Procedure Laterality Date  . BUNIONECTOMY     Right foot   . DILATION AND CURETTAGE OF UTERUS      There were no vitals filed for this visit.    Subjective Assessment - 09/29/20 1523    Subjective Pt reports ~ 3 years ago she injured her L knee while going to an exercise class. Pt twisted her knee while in her yard that further injured it (had to walk with crutches). Pt saw [facility] -- MRI was ordered and saw meniscal tear. Pt states that she got an arthroscopic surgery which did not help her pain. Pt states she tried to do pilates and irritated knee again. Pt received another MRI on 12/9 and showed worsening of his meniscal tear. Pt states her general activity has decreased.    Limitations Sitting;Standing;Walking    How long can you stand comfortably? Stands ~6 hrs at work with Megan Greer    How long can you walk comfortably? Depends on leg pain    Diagnostic tests 12/9 MRI IMPRESSION:   1.  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   2.   Mucoid degeneration of the ACL.   3.  Tricompartmental arthrosis. Proximal tib-fib arthrosis.   4.  Small knee joint effusion.   5.  Small Baker's cyst.    Patient Stated Goals Improve/increase activity    Currently in Pain? No/denies    Pain Score --   Sharp pain with certain movements   Pain Location Knee    Pain Orientation Left    Pain Descriptors / Indicators Shooting;Sharp    Pain Type Chronic pain    Aggravating Factors  Unknown -- has continued L LE swelling    Pain Relieving Factors Mobic    Effect of Pain on Daily Activities Unable to return to gym activities              Megan Greer PT Assessment - 09/29/20 0001      Assessment   Medical Diagnosis M76.32 (ICD-10-CM) - Iliotibial band syndrome, left leg  M25.562 (ICD-10-CM) - Pain in left knee    Referring Provider (PT) Megan Ashing, MD    Onset Date/Surgical Date --   ~ 3 years ago   Prior Therapy Went to Megan Greer for hip pain (3 years ago)      Precautions   Precautions None  Restrictions   Weight Bearing Restrictions No      Balance Screen   Has the patient fallen in the past 6 months No      Megan Greer residence    Living Arrangements Spouse/significant other    Available Help at Discharge Family      Prior Function   Level of Independence Independent    Vocation Part time employment   8 hrs, 4 days a week   Vocation Requirements Surgical nurse    Leisure Gardening/yard work      Observation/Other Assessments   Focus on Therapeutic Outcomes (FOTO)  n/a      Functional Tests   Functional tests Single leg stance      Single Leg Stance   Comments L LE: 12 sec; R LE: 2 sec      Strength   Overall Strength Comments bilat hip extension 3+/5; hip abduction 4/5; L knee ext 4/5; R knee ext 4+/5      Special Tests    Special Tests Hip Special Tests    Hip Special Tests  Megan Greer (FABER) Test;Trendelenberg Test;Ober's Test      [facility]) Test   Findings Negative     Comments Feels in joint on R; feels stretch in L      Trendelenburg Test   Findings Positive    Side --   bilat     Ober's Test   Findings Negative                      Objective measurements completed on examination: See above findings.               PT Education - 09/29/20 1601    Education Details Discussed exam findings and POC. Discussed aquatics.    Person(s) Educated Patient    Methods Explanation;Handout    Comprehension Verbalized understanding;Tactile cues required;Returned demonstration            PT Short Term Goals - 09/29/20 1719      PT SHORT TERM GOAL #1   Title STG = LTG             PT Long Term Goals - 09/29/20 1719      PT LONG TERM GOAL #1   Title The patient will be independent in safe, self progression of HEP and a basic gym program (possible return to [employer])    Time 6    Period Weeks    Status New    Target Date 11/10/20      PT LONG TERM GOAL #2   Title Pt will be able to perform full squat with </= 2/10 pain for improved functional strength    Baseline Unable to attempt full squat because of too much pain    Time 6    Period Weeks    Status New    Target Date 11/10/20      PT LONG TERM GOAL #3   Title Pt will be able to maintain SLS at least 10 sec bilat to demo improved LE stability & proprioception    Baseline 2 sec on R, 12 sec on L    Time 6    Period Weeks    Status New    Target Date 11/10/20                  Plan - 09/29/20 1711    Clinical Impression Statement Megan Greer is a 65  y/o F presenting to OPPT due to complaint of chronic intermittent L knee pain. Pt with history of multiple L knee injuries affecting weightbearing with knee flexion. On assessment, pt demos L>R LE edema, decreased general hip strength, decreased LE stability as demonstrated by her SLS, and decreased activity tolerance/endurance affecting her ability to return to community wellness and gym activity. Pt  would benefit from PT to address these issues for return to gym activities and reach pt's personal goal for wellness.    Personal Factors and Comorbidities Comorbidity 1    Comorbidities L Knee: Meniscus degeneration, degeneration of ACL, Tricompartmental arthrosis, Small Baker's cyst    Examination-Activity Limitations Squat;Stairs;Locomotion Level;Transfers    Examination-Participation Restrictions Community Activity;Cleaning;Occupation    Stability/Clinical Decision Making Stable/Uncomplicated    Clinical Decision Making Low    Rehab Potential Good    PT Frequency 1x / week    PT Duration 6 weeks    PT Treatment/Interventions ADLs/Self Care Home Management;Aquatic Therapy;Electrical Stimulation;Cryotherapy;Moist Heat;Iontophoresis 4mg /ml Dexamethasone;DME Instruction;Gait training;Stair training;Functional mobility training;Therapeutic activities;Therapeutic exercise;Balance training;Neuromuscular re-education;Patient/family education;Manual techniques;Passive range of motion;Taping;Vasopneumatic Device;Joint Manipulations    PT Next Visit Plan Assess response to HEP. Warm-up on bike. Initiate increased strengthening exercises for quad and hip stability    PT Home Exercise Plan Access Code [email or web address]    Consulted and Agree with Plan of Care Patient           Patient will benefit from skilled therapeutic intervention in order to improve the following deficits and impairments:  Decreased balance,Decreased endurance,Decreased mobility,Increased edema,Decreased range of motion,Impaired perceived functional ability,Decreased activity tolerance,Decreased strength,Pain,Postural dysfunction,Increased fascial restricitons,Difficulty walking  Visit Diagnosis: Chronic pain of left knee  Muscle weakness (generalized)  Unsteadiness on feet     Problem List Patient Active Problem List   Diagnosis Date Noted  . Abdominal pain, chronic, right lower quadrant 08/13/2013  . Unspecified  hypothyroidism 05/06/2013  . Transaminitis 01/18/2012  . Anxiety and depression 11/17/2011  . Preventative health care 11/17/2011  . Osteopenia 11/17/2011  . Chronic insomnia 11/17/2011  . Hyperlipidemia 11/17/2011  . Gastroenteritis 11/17/2011    Megan Greer Megan Greer PT, DPT 09/29/2020, 5:27 PM  [facility] [place], [place], [address] Phone: [phone]   Fax:  [phone]  Name: Megan Greer MRN: [id number] Date of Birth: 1955-11-25

## 2020-10-04 ENCOUNTER — Ambulatory Visit (HOSPITAL_BASED_OUTPATIENT_CLINIC_OR_DEPARTMENT_OTHER): Payer: Medicare Other | Attending: Adult Reconstructive Orthopaedic Surgery | Admitting: Physical Therapy

## 2020-10-04 ENCOUNTER — Other Ambulatory Visit: Payer: Self-pay

## 2020-10-04 DIAGNOSIS — M25562 Pain in left knee: Secondary | ICD-10-CM | POA: Diagnosis not present

## 2020-10-04 DIAGNOSIS — G8929 Other chronic pain: Secondary | ICD-10-CM | POA: Diagnosis not present

## 2020-10-04 DIAGNOSIS — R2681 Unsteadiness on feet: Secondary | ICD-10-CM

## 2020-10-04 DIAGNOSIS — M6281 Muscle weakness (generalized): Secondary | ICD-10-CM | POA: Insufficient documentation

## 2020-10-04 NOTE — Therapy (Signed)
[place], [place], [address] Phone: [phone]   Fax:  [phone]  Physical Therapy Treatment  Patient Details  Name: Megan Greer MRN: [id number] Date of Birth: 1955/09/05 Referring Provider (PT): Rosanne Ashing, MD   Encounter Date: 10/04/2020    Past Medical History:  Diagnosis Date  . Anxiety   . GERD (gastroesophageal reflux disease)   . Heart murmur   . Hyperlipidemia   . Insomnia   . Mitral valve regurgitation    mild  . Sinusitis     Past Surgical History:  Procedure Laterality Date  . BUNIONECTOMY     Right foot   . DILATION AND CURETTAGE OF UTERUS      There were no vitals filed for this visit.   Subjective Assessment - 10/04/20 1520    Subjective Pt states she did the exercises a few times but not enough. Pt stated she did a lot of house work this weekend.    Limitations Sitting;Standing;Walking    How long can you stand comfortably? Stands ~6 hrs at work with [employer]    How long can you walk comfortably? Depends on leg pain    Diagnostic tests 12/9 MRI IMPRESSION:   1.  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   2.  Mucoid degeneration of the ACL.   3.  Tricompartmental arthrosis. Proximal tib-fib arthrosis.   4.  Small knee joint effusion.   5.  Small Baker's cyst.    Patient Stated Goals Improve/increase activity    Currently in Pain? No/denies                             [facility] Adult PT Treatment/Exercise - 10/04/20 0001      Exercises   Exercises Knee/Hip      Knee/Hip Exercises: Aerobic   Recumbent Bike 5 min      Knee/Hip Exercises: Machines for Strengthening   Other Machine Shuttle 25# double leg x10; 75# eccentric single leg press x10 bilat; donkey kick 12# 2x10 bilat;      Knee/Hip Exercises: Standing   Heel Raises Both;2 sets;10 reps    Other Standing Knee Exercises [place] against wall 3x10 sec L & R LE alternating    Other  Standing Knee Exercises Wall squat 3 x 1 min                  PT Education - 10/04/20 1713    Education Details Discussed watching her L knee mobility during exercises to ensure it does not track past the toes and to avoid any knee valgus.    Person(s) Educated Patient    Methods Explanation;Handout;Demonstration    Comprehension Verbalized understanding;Tactile cues required;Returned demonstration;Verbal cues required            PT Short Term Goals - 09/29/20 1719      PT SHORT TERM GOAL #1   Title STG = LTG             PT Long Term Goals - 09/29/20 1719      PT LONG TERM GOAL #1   Title The patient will be independent in safe, self progression of HEP and a basic gym program (possible return to [employer])    Time 6    Period Weeks    Status New    Target Date 11/10/20      PT LONG TERM GOAL #2   Title  Pt will be able to perform full squat with </= 2/10 pain for improved functional strength    Baseline Unable to attempt full squat because of too much pain    Time 6    Period Weeks    Status New    Target Date 11/10/20      PT LONG TERM GOAL #3   Title Pt will be able to maintain SLS at least 10 sec bilat to demo improved LE stability & proprioception    Baseline 2 sec on R, 12 sec on L    Time 6    Period Weeks    Status New    Target Date 11/10/20                 Plan - 10/04/20 1543    Clinical Impression Statement Treatment session focused on reviewing her HEP and adding new exercises as needed. Worked on increasing eccentric knee strengthening, hip abductor and extensor strengthening. Pt able to tolerate exercises well without increase in knee pain. Discussed current situation with aquatics on Wednesdays and there may be more availability in April.    Personal Factors and Comorbidities Comorbidity 1    Comorbidities L Knee: Meniscus degeneration, degeneration of ACL, Tricompartmental arthrosis, Small Baker's cyst    Examination-Activity  Limitations Squat;Stairs;Locomotion Level;Transfers    Examination-Participation Restrictions Community Activity;Cleaning;Occupation    Stability/Clinical Decision Making Stable/Uncomplicated    Rehab Potential Good    PT Frequency 1x / week    PT Duration 6 weeks    PT Treatment/Interventions ADLs/Self Care Home Management;Aquatic Therapy;Electrical Stimulation;Cryotherapy;Moist Heat;Iontophoresis 4mg /ml Dexamethasone;DME Instruction;Gait training;Stair training;Functional mobility training;Therapeutic activities;Therapeutic exercise;Balance training;Neuromuscular re-education;Patient/family education;Manual techniques;Passive range of motion;Taping;Vasopneumatic Device;Joint Manipulations    PT Next Visit Plan Assess response to HEP. Warm-up on bike. Progress strengthening exercises for quad and hip stability    PT Home Exercise Plan Access Code [email or web address]    Consulted and Agree with Plan of Care Patient           Patient will benefit from skilled therapeutic intervention in order to improve the following deficits and impairments:  Decreased balance,Decreased endurance,Decreased mobility,Increased edema,Decreased range of motion,Impaired perceived functional ability,Decreased activity tolerance,Decreased strength,Pain,Postural dysfunction,Increased fascial restricitons,Difficulty walking  Visit Diagnosis: Chronic pain of left knee  Muscle weakness (generalized)  Unsteadiness on feet     Problem List Patient Active Problem List   Diagnosis Date Noted  . Abdominal pain, chronic, right lower quadrant 08/13/2013  . Unspecified hypothyroidism 05/06/2013  . Transaminitis 01/18/2012  . Anxiety and depression 11/17/2011  . Preventative health care 11/17/2011  . Osteopenia 11/17/2011  . Chronic insomnia 11/17/2011  . Hyperlipidemia 11/17/2011  . Gastroenteritis 11/17/2011    Trayshawn Durkin April Ma L Waverly Chavarria PT, DPT 10/04/2020, 5:15 PM  [facility] [place], [place], [address] Phone: [phone]   Fax:  [phone]  Name: ZONNIQUE NORKUS MRN: [id number] Date of Birth: Aug 22, 1955

## 2020-10-05 ENCOUNTER — Encounter (HOSPITAL_BASED_OUTPATIENT_CLINIC_OR_DEPARTMENT_OTHER): Payer: Medicare Other | Admitting: Physical Therapy

## 2020-10-06 DIAGNOSIS — I8312 Varicose veins of left lower extremity with inflammation: Secondary | ICD-10-CM | POA: Diagnosis not present

## 2020-10-06 DIAGNOSIS — M79605 Pain in left leg: Secondary | ICD-10-CM | POA: Diagnosis not present

## 2020-10-13 ENCOUNTER — Ambulatory Visit (HOSPITAL_BASED_OUTPATIENT_CLINIC_OR_DEPARTMENT_OTHER): Payer: Medicare Other | Admitting: Physical Therapy

## 2020-10-15 MED FILL — ROSUVASTATIN CALCIUM 10 MG: 10 | 30 days supply | Qty: 30 | Fill #4

## 2020-10-20 ENCOUNTER — Ambulatory Visit (HOSPITAL_BASED_OUTPATIENT_CLINIC_OR_DEPARTMENT_OTHER): Payer: Medicare Other | Admitting: Physical Therapy

## 2020-10-27 ENCOUNTER — Other Ambulatory Visit: Payer: Self-pay

## 2020-10-27 ENCOUNTER — Ambulatory Visit (HOSPITAL_BASED_OUTPATIENT_CLINIC_OR_DEPARTMENT_OTHER): Payer: Medicare Other | Attending: Adult Reconstructive Orthopaedic Surgery | Admitting: Physical Therapy

## 2020-10-27 DIAGNOSIS — M25562 Pain in left knee: Secondary | ICD-10-CM | POA: Insufficient documentation

## 2020-10-27 DIAGNOSIS — M6281 Muscle weakness (generalized): Secondary | ICD-10-CM

## 2020-10-27 DIAGNOSIS — G8929 Other chronic pain: Secondary | ICD-10-CM | POA: Diagnosis not present

## 2020-10-27 DIAGNOSIS — R2681 Unsteadiness on feet: Secondary | ICD-10-CM | POA: Insufficient documentation

## 2020-10-27 NOTE — Therapy (Signed)
[place], [place], [address] Phone: [phone]   Fax:  [phone]  Physical Therapy Treatment  Patient Details  Name: Megan Greer MRN: [id number] Date of Birth: July 20, 1956 Referring Provider (PT): Rosanne Ashing, MD   Encounter Date: 10/27/2020   PT End of Session - 10/27/20 1347    Visit Number 3    Number of Visits 6    Date for PT Re-Evaluation 11/10/20    Authorization Type UHC Medicare    Progress Note Due on Visit 10    PT Start Time 1300    PT Stop Time 1345    PT Time Calculation (min) 45 min    Activity Tolerance Patient tolerated treatment well    Behavior During Therapy [facility] for tasks assessed/performed           Past Medical History:  Diagnosis Date  . Anxiety   . GERD (gastroesophageal reflux disease)   . Heart murmur   . Hyperlipidemia   . Insomnia   . Mitral valve regurgitation    mild  . Sinusitis     Past Surgical History:  Procedure Laterality Date  . BUNIONECTOMY     Right foot   . DILATION AND CURETTAGE OF UTERUS      There were no vitals filed for this visit.   Subjective Assessment - 10/27/20 1303    Subjective Pt reports she's been working. Pt's daughter is moving and she's been trying to help her get things. Pt has been going to a weight loss clinic. Pt notes she has not been exercising -- she has not been able to try her HEP.    Limitations Sitting;Standing;Walking    How long can you stand comfortably? Stands ~6 hrs at work with [employer]    How long can you walk comfortably? Depends on leg pain    Diagnostic tests 12/9 MRI IMPRESSION:   1.  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   2.  Mucoid degeneration of the ACL.   3.  Tricompartmental arthrosis. Proximal tib-fib arthrosis.   4.  Small knee joint effusion.   5.  Small Baker's cyst.    Patient Stated Goals Improve/increase activity    Currently in Pain? No/denies                              [facility] Adult PT Treatment/Exercise - 10/27/20 0001      Knee/Hip Exercises: Aerobic   Recumbent Bike L1 x 5 min      Knee/Hip Exercises: Machines for Strengthening   Other Machine Shuttle 50# double leg 2x10; 75# and then 100# eccentric single leg press x10 bilat; sidelying leg press 37# 2x10;      Knee/Hip Exercises: Standing   Forward Step Up Both;10 reps;Hand Hold: 1;Step Height: 6"    Other Standing Knee Exercises Deadlift 15# kettlebell 2x10    Other Standing Knee Exercises One leg eccentric sit to stand x10 on L                    PT Short Term Goals - 09/29/20 1719      PT SHORT TERM GOAL #1   Title STG = LTG             PT Long Term Goals - 09/29/20 1719      PT LONG TERM GOAL #1   Title The patient will be independent in safe, self  progression of HEP and a basic gym program (possible return to [employer])    Time 6    Period Weeks    Status New    Target Date 11/10/20      PT LONG TERM GOAL #2   Title Pt will be able to perform full squat with </= 2/10 pain for improved functional strength    Baseline Unable to attempt full squat because of too much pain    Time 6    Period Weeks    Status New    Target Date 11/10/20      PT LONG TERM GOAL #3   Title Pt will be able to maintain SLS at least 10 sec bilat to demo improved LE stability & proprioception    Baseline 2 sec on R, 12 sec on L    Time 6    Period Weeks    Status New    Target Date 11/10/20                 Plan - 10/27/20 1321    Clinical Impression Statement Treatment focused on progressing her eccentric knee strengthening, hip abductor and extensor strengthening. Pt tolerated exercises well. Pt has not been performing HEP at home -- modified HEP to encourage improved compliance.    Personal Factors and Comorbidities Comorbidity 1    Comorbidities L Knee: Meniscus degeneration, degeneration of ACL, Tricompartmental arthrosis, Small  Baker's cyst    Examination-Activity Limitations Squat;Stairs;Locomotion Level;Transfers    Examination-Participation Restrictions Community Activity;Cleaning;Occupation    Stability/Clinical Decision Making Stable/Uncomplicated    Rehab Potential Good    PT Frequency 1x / week    PT Duration 6 weeks    PT Treatment/Interventions ADLs/Self Care Home Management;Aquatic Therapy;Electrical Stimulation;Cryotherapy;Moist Heat;Iontophoresis 4mg /ml Dexamethasone;DME Instruction;Gait training;Stair training;Functional mobility training;Therapeutic activities;Therapeutic exercise;Balance training;Neuromuscular re-education;Patient/family education;Manual techniques;Passive range of motion;Taping;Vasopneumatic Device;Joint Manipulations    PT Next Visit Plan Assess response to HEP. Warm-up on bike. Progress strengthening exercises for quad and hip stability    PT Home Exercise Plan Access Code [email or web address]    Consulted and Agree with Plan of Care Patient           Patient will benefit from skilled therapeutic intervention in order to improve the following deficits and impairments:  Decreased balance,Decreased endurance,Decreased mobility,Increased edema,Decreased range of motion,Impaired perceived functional ability,Decreased activity tolerance,Decreased strength,Pain,Postural dysfunction,Increased fascial restricitons,Difficulty walking  Visit Diagnosis: Chronic pain of left knee  Muscle weakness (generalized)  Unsteadiness on feet     Problem List Patient Active Problem List   Diagnosis Date Noted  . Abdominal pain, chronic, right lower quadrant 08/13/2013  . Unspecified hypothyroidism 05/06/2013  . Transaminitis 01/18/2012  . Anxiety and depression 11/17/2011  . Preventative health care 11/17/2011  . Osteopenia 11/17/2011  . Chronic insomnia 11/17/2011  . Hyperlipidemia 11/17/2011  . Gastroenteritis 11/17/2011    Saliyah Gillin April Ma L Amelio Brosky PT, DPT 10/27/2020, 1:51 PM  [facility] [place], [place], [address] Phone: [phone]   Fax:  [phone]  Name: Megan Greer MRN: [id number] Date of Birth: [birth year]

## 2020-11-02 DIAGNOSIS — I8312 Varicose veins of left lower extremity with inflammation: Secondary | ICD-10-CM | POA: Diagnosis not present

## 2020-11-03 ENCOUNTER — Ambulatory Visit (HOSPITAL_BASED_OUTPATIENT_CLINIC_OR_DEPARTMENT_OTHER): Payer: Medicare Other | Admitting: Physical Therapy

## 2020-11-10 ENCOUNTER — Ambulatory Visit (HOSPITAL_BASED_OUTPATIENT_CLINIC_OR_DEPARTMENT_OTHER): Payer: Medicare Other | Admitting: Physical Therapy

## 2020-11-10 ENCOUNTER — Other Ambulatory Visit: Payer: Self-pay

## 2020-11-10 DIAGNOSIS — M6281 Muscle weakness (generalized): Secondary | ICD-10-CM

## 2020-11-10 DIAGNOSIS — R2681 Unsteadiness on feet: Secondary | ICD-10-CM | POA: Diagnosis not present

## 2020-11-10 DIAGNOSIS — G8929 Other chronic pain: Secondary | ICD-10-CM

## 2020-11-10 DIAGNOSIS — M25562 Pain in left knee: Secondary | ICD-10-CM | POA: Diagnosis not present

## 2020-11-10 DIAGNOSIS — I8312 Varicose veins of left lower extremity with inflammation: Secondary | ICD-10-CM | POA: Diagnosis not present

## 2020-11-10 NOTE — Therapy (Signed)
[facility] [address] [place], [place], [address] Phone: [phone]   Fax:  [phone]  Physical Therapy Treatment  Patient Details  Name: Megan Greer MRN: [id number] Date of Birth: 1955/12/27 Referring Provider (PT): Rosanne Ashing, MD   Encounter Date: 11/10/2020   PT End of Session - 11/10/20 1125    Visit Number 4    Number of Visits 6    Date for PT Re-Evaluation 11/10/20    Authorization Type [employer] Medicare    Progress Note Due on Visit 10    PT Start Time 1100    PT Stop Time 1145    PT Time Calculation (min) 45 min    Activity Tolerance Patient tolerated treatment well    Behavior During Therapy [facility] for tasks assessed/performed           Past Medical History:  Diagnosis Date  . Anxiety   . GERD (gastroesophageal reflux disease)   . Heart murmur   . Hyperlipidemia   . Insomnia   . Mitral valve regurgitation    mild  . Sinusitis     Past Surgical History:  Procedure Laterality Date  . BUNIONECTOMY     Right foot   . DILATION AND CURETTAGE OF UTERUS      There were no vitals filed for this visit.   Subjective Assessment - 11/10/20 1104    Subjective Pt states her knee is feeling better after last treatment session. Pt reports she has been losing more weight.    Limitations Sitting;Standing;Walking    How long can you stand comfortably? Stands ~6 hrs at work with [employer]    How long can you walk comfortably? Depends on leg pain    Diagnostic tests 12/9 MRI IMPRESSION:   1.  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   2.  Mucoid degeneration of the ACL.   3.  Tricompartmental arthrosis. Proximal tib-fib arthrosis.   4.  Small knee joint effusion.   5.  Small Baker's cyst.    Patient Stated Goals Improve/increase activity    Currently in Pain? No/denies              [facility] PT Assessment - 11/10/20 0001      Single Leg Stance   Comments L LE: 19 sec; R LE: 10 sec            Full squat  x5 with no pain       [facility] Adult PT Treatment/Exercise - 11/10/20 0001      Knee/Hip Exercises: Aerobic   Recumbent Bike L7 x 5 min      Knee/Hip Exercises: Machines for Strengthening   Other Machine Shuttle 75# double leg x10; 100# & then 125# eccentric single leg press x10 each bilat; sidelying leg press 50# 2x10; quadruped leg extension 25# 2x10      Knee/Hip Exercises: Standing   Other Standing Knee Exercises Wall squat x 10                    PT Short Term Goals - 09/29/20 1719      PT SHORT TERM GOAL #1   Title STG = LTG             PT Long Term Goals - 11/10/20 1114      PT LONG TERM GOAL #1   Title The patient will be independent in safe, self progression of HEP and a basic gym program (possible return to [employer])  Time 6    Period Weeks    Status Achieved      PT LONG TERM GOAL #2   Title Pt will be able to perform full squat with </= 2/10 pain for improved functional strength    Baseline No pain felt with full squat    Time 6    Period Weeks    Status Achieved      PT LONG TERM GOAL #3   Title Pt will be able to maintain SLS at least 10 sec bilat to demo improved LE stability & proprioception    Baseline 2 sec on R, 12 sec on L    Time 6    Period Weeks    Status Achieved                 Plan - 11/10/20 1146    Clinical Impression Statement Treatment focused on rechecking LTGs and increasing knee and hip strengthening. Pt has been able to perform HEP. Discussed adding wall squats and increasing weight for deadlifts. Pt has met all LTGs but is interested in [employer]. No current aquatics available on Wednesday which is when pt is free. Discussed placing her on wait list. Pt would like to be placed on hold for any future aquatics availability.    Personal Factors and Comorbidities Comorbidity 1    Comorbidities L Knee: Meniscus degeneration, degeneration of ACL, Tricompartmental arthrosis, Small Baker's cyst     Examination-Activity Limitations Squat;Stairs;Locomotion Level;Transfers    Examination-Participation Restrictions Community Activity;Cleaning;Occupation    Stability/Clinical Decision Making Stable/Uncomplicated    Rehab Potential Good    PT Frequency 1x / week    PT Duration 6 weeks    PT Treatment/Interventions ADLs/Self Care Home Management;Aquatic Therapy;Electrical Stimulation;Cryotherapy;Moist Heat;Iontophoresis 42m/ml Dexamethasone;DME Instruction;Gait training;Stair training;Functional mobility training;Therapeutic activities;Therapeutic exercise;Balance training;Neuromuscular re-education;Patient/family education;Manual techniques;Passive range of motion;Taping;Vasopneumatic Device;Joint Manipulations    PT Next Visit Plan Assess response to HEP. Warm-up on bike. Progress strengthening exercises for quad and hip stability    PT Home Exercise Plan Access Code [email or web address]    Consulted and Agree with Plan of Care Patient           Patient will benefit from skilled therapeutic intervention in order to improve the following deficits and impairments:  Decreased balance,Decreased endurance,Decreased mobility,Increased edema,Decreased range of motion,Impaired perceived functional ability,Decreased activity tolerance,Decreased strength,Pain,Postural dysfunction,Increased fascial restricitons,Difficulty walking  Visit Diagnosis: Chronic pain of left knee  Muscle weakness (generalized)  Unsteadiness on feet     Problem List Patient Active Problem List   Diagnosis Date Noted  . Abdominal pain, chronic, right lower quadrant 08/13/2013  . Unspecified hypothyroidism 05/06/2013  . Transaminitis 01/18/2012  . Anxiety and depression 11/17/2011  . Preventative health care 11/17/2011  . Osteopenia 11/17/2011  . Chronic insomnia 11/17/2011  . Hyperlipidemia 11/17/2011  . Gastroenteritis 11/17/2011    Aanika Defoor April Ma L Raegen Tarpley PT, DPT 11/10/2020, 11:52 AM  [facility] [address]Phone: [phone]  Fax:  [phone] Name: Megan YIUMRN: [id number] of Birth: [birth year]

## 2020-11-22 ENCOUNTER — Other Ambulatory Visit (HOSPITAL_COMMUNITY): Payer: Self-pay

## 2020-11-22 MED FILL — Rosuvastatin Calcium Tab 10 MG: ORAL | 30 days supply | Qty: 30 | Fill #0 | Status: AC

## 2020-11-23 ENCOUNTER — Other Ambulatory Visit (HOSPITAL_COMMUNITY): Payer: Self-pay

## 2020-11-23 MED FILL — Bupropion HCl Tab ER 24HR 300 MG: ORAL | 90 days supply | Qty: 90 | Fill #0 | Status: AC

## 2020-11-24 ENCOUNTER — Other Ambulatory Visit (HOSPITAL_COMMUNITY): Payer: Self-pay

## 2020-12-02 ENCOUNTER — Other Ambulatory Visit (HOSPITAL_COMMUNITY): Payer: Self-pay

## 2020-12-02 MED ORDER — TRAZODONE HCL 50 MG PO TABS
ORAL_TABLET | ORAL | 4 refills | Status: DC
  Filled 2020-12-02: qty 270, 90d supply, fill #0

## 2020-12-02 MED ORDER — BUPROPION HCL ER (XL) 300 MG PO TB24
ORAL_TABLET | ORAL | 4 refills | Status: DC
  Filled 2020-12-02 – 2021-03-25 (×2): qty 90, 90d supply, fill #0

## 2020-12-10 ENCOUNTER — Other Ambulatory Visit (HOSPITAL_COMMUNITY): Payer: Self-pay

## 2020-12-23 ENCOUNTER — Other Ambulatory Visit (HOSPITAL_COMMUNITY): Payer: Self-pay

## 2020-12-23 MED ORDER — TRAZODONE HCL 50 MG PO TABS
ORAL_TABLET | ORAL | 0 refills | Status: DC
  Filled 2020-12-23: qty 60, 30d supply, fill #0

## 2020-12-23 MED ORDER — FLUTICASONE PROPIONATE 50 MCG/ACT NA SUSP
NASAL | 1 refills | Status: DC
  Filled 2020-12-23: qty 16, 30d supply, fill #0
  Filled 2021-07-25: qty 16, 30d supply, fill #1

## 2020-12-24 ENCOUNTER — Other Ambulatory Visit (HOSPITAL_COMMUNITY): Payer: Self-pay

## 2021-01-03 IMAGING — CR DG CHEST 2V
2 series · 2 of 2 positions shown · non-contrast
Comparison: Chest x-ray 12/18/2019.

CLINICAL DATA: 63-year-old female with history of dyspnea on
exertion. Chest tightness for the past 9 months.

EXAM:
CHEST - 2 VIEW

[w chest pa]
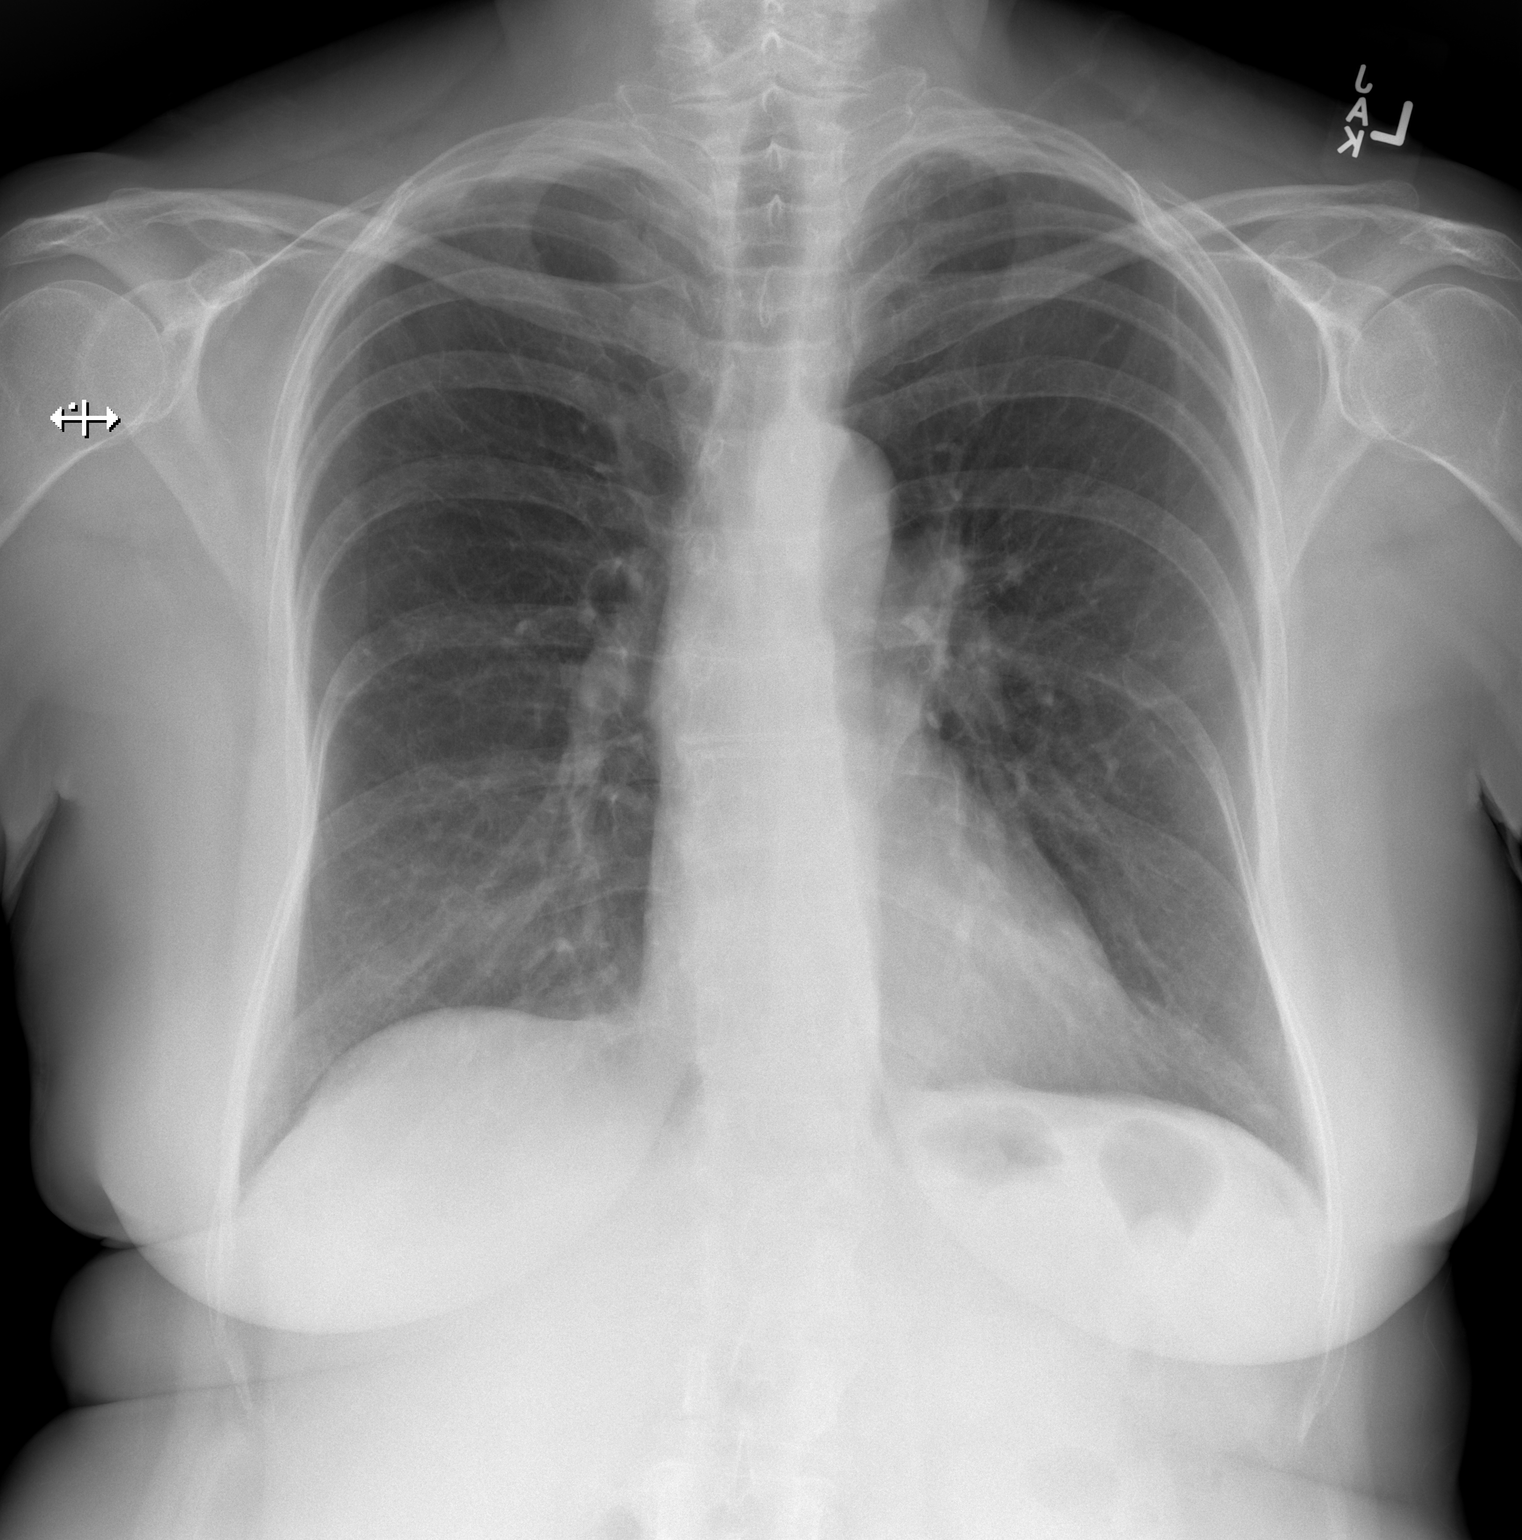

[w chest lat]
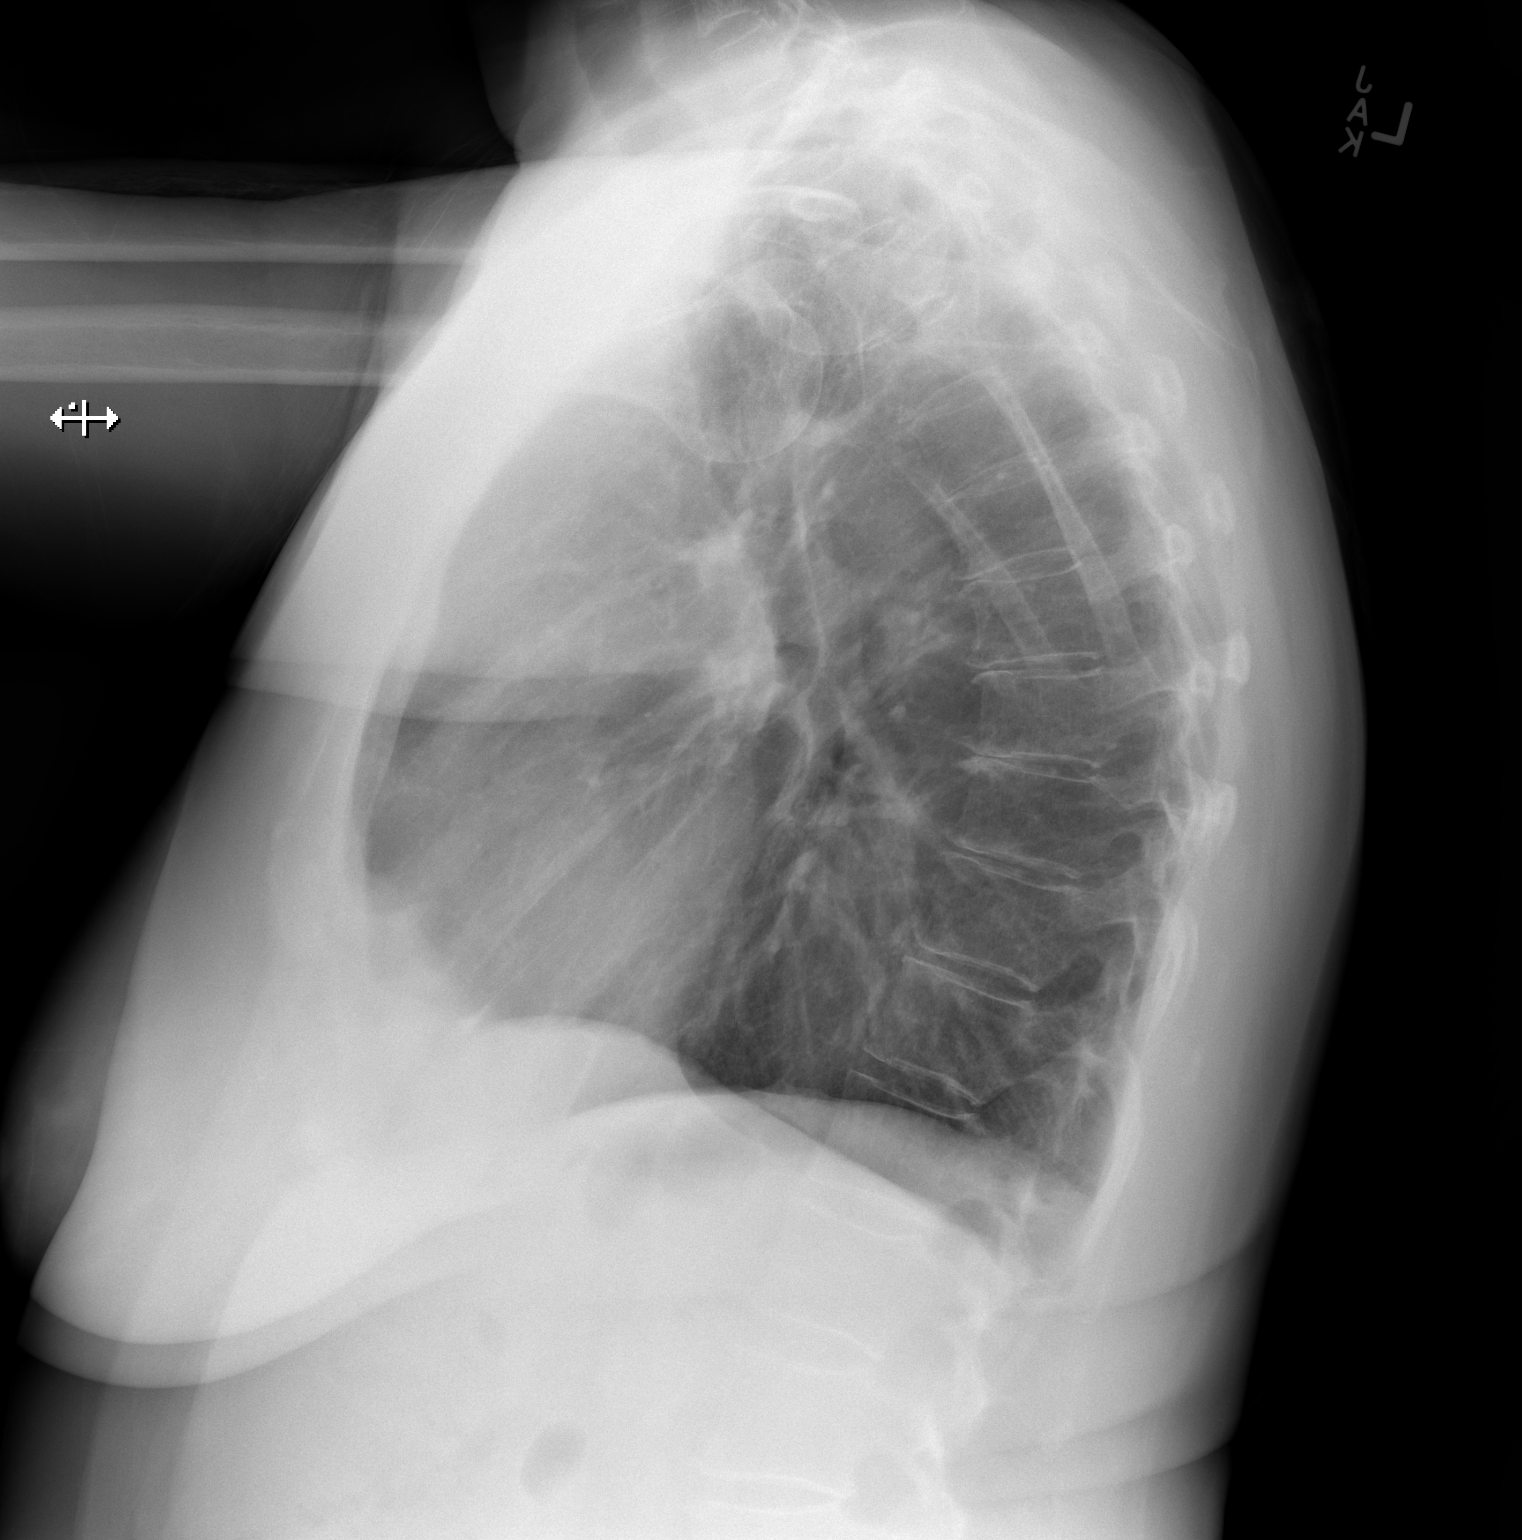

[2 of 2 positions shown; findings below may reference images not displayed]

FINDINGS: Lung volumes are normal. No consolidative airspace disease. No
pleural effusions. No pneumothorax. No pulmonary nodule or mass
noted. Pulmonary vasculature and the cardiomediastinal silhouette
are within normal limits.
IMPRESSION: No radiographic evidence of acute cardiopulmonary disease.

## 2021-01-11 ENCOUNTER — Other Ambulatory Visit (HOSPITAL_COMMUNITY): Payer: Self-pay

## 2021-01-11 MED FILL — Rosuvastatin Calcium Tab 10 MG: ORAL | 30 days supply | Qty: 30 | Fill #1 | Status: AC

## 2021-01-19 ENCOUNTER — Other Ambulatory Visit (HOSPITAL_COMMUNITY): Payer: Self-pay

## 2021-01-19 DIAGNOSIS — M79671 Pain in right foot: Secondary | ICD-10-CM | POA: Diagnosis not present

## 2021-01-19 DIAGNOSIS — E7800 Pure hypercholesterolemia, unspecified: Secondary | ICD-10-CM | POA: Diagnosis not present

## 2021-01-19 DIAGNOSIS — Z79899 Other long term (current) drug therapy: Secondary | ICD-10-CM | POA: Diagnosis not present

## 2021-01-19 DIAGNOSIS — K219 Gastro-esophageal reflux disease without esophagitis: Secondary | ICD-10-CM | POA: Diagnosis not present

## 2021-01-19 DIAGNOSIS — M79605 Pain in left leg: Secondary | ICD-10-CM | POA: Diagnosis not present

## 2021-01-19 DIAGNOSIS — F5101 Primary insomnia: Secondary | ICD-10-CM | POA: Diagnosis not present

## 2021-01-19 DIAGNOSIS — Z0000 Encounter for general adult medical examination without abnormal findings: Secondary | ICD-10-CM | POA: Diagnosis not present

## 2021-01-19 DIAGNOSIS — R7303 Prediabetes: Secondary | ICD-10-CM | POA: Diagnosis not present

## 2021-01-19 DIAGNOSIS — E039 Hypothyroidism, unspecified: Secondary | ICD-10-CM | POA: Diagnosis not present

## 2021-01-19 MED ORDER — PANTOPRAZOLE SODIUM 40 MG PO TBEC
DELAYED_RELEASE_TABLET | ORAL | 2 refills | Status: DC
  Filled 2021-01-19: qty 90, 90d supply, fill #0
  Filled 2021-06-15: qty 90, 90d supply, fill #1
  Filled 2021-10-04: qty 90, 90d supply, fill #2

## 2021-01-19 MED ORDER — BUPROPION HCL ER (XL) 300 MG PO TB24
ORAL_TABLET | ORAL | 2 refills | Status: DC
  Filled 2021-01-19 – 2021-07-25 (×2): qty 90, 90d supply, fill #0

## 2021-01-19 MED ORDER — TRAZODONE HCL 50 MG PO TABS
ORAL_TABLET | ORAL | 2 refills | Status: DC
  Filled 2021-01-19: qty 180, 90d supply, fill #0
  Filled 2021-05-11: qty 180, 90d supply, fill #1
  Filled 2021-08-16: qty 180, 90d supply, fill #2

## 2021-01-19 MED ORDER — MELOXICAM 15 MG PO TABS
ORAL_TABLET | ORAL | 2 refills | Status: DC
  Filled 2021-01-19: qty 90, 90d supply, fill #0
  Filled 2021-07-11: qty 90, 90d supply, fill #1

## 2021-01-19 MED ORDER — ROSUVASTATIN CALCIUM 10 MG PO TABS
ORAL_TABLET | ORAL | 2 refills | Status: DC
  Filled 2021-01-19 – 2021-03-25 (×2): qty 90, 90d supply, fill #0
  Filled 2021-07-25: qty 90, 90d supply, fill #1
  Filled 2021-11-26: qty 90, 90d supply, fill #2

## 2021-02-03 ENCOUNTER — Encounter (HOSPITAL_BASED_OUTPATIENT_CLINIC_OR_DEPARTMENT_OTHER): Payer: Self-pay | Admitting: Physical Therapy

## 2021-02-04 ENCOUNTER — Telehealth: Payer: Self-pay | Admitting: Physical Therapy

## 2021-02-04 NOTE — Telephone Encounter (Signed)
Good morning Dr. Felipa Eth!  I had seen Ms. Saddler for PT in March and she is requesting your advice. I have included her inquiry below.  Thank you, Nazarene Bunning April Ma L [place], PT    [place], [place] to Me       6:48 PM Would you ask Dr Felipa Eth if I should be taking anything for my thyroid as it seemed to be on the low side.  Also does he recommend starting on Metformin for an A1C in my range?  Thank you.

## 2021-02-17 ENCOUNTER — Other Ambulatory Visit (HOSPITAL_COMMUNITY): Payer: Self-pay

## 2021-02-17 MED FILL — Rosuvastatin Calcium Tab 10 MG: ORAL | 30 days supply | Qty: 30 | Fill #2 | Status: AC

## 2021-02-23 ENCOUNTER — Other Ambulatory Visit: Payer: Self-pay | Admitting: Geriatric Medicine

## 2021-02-23 DIAGNOSIS — Z1231 Encounter for screening mammogram for malignant neoplasm of breast: Secondary | ICD-10-CM

## 2021-03-10 ENCOUNTER — Other Ambulatory Visit: Payer: Self-pay | Admitting: Family Medicine

## 2021-03-10 ENCOUNTER — Other Ambulatory Visit: Payer: Self-pay

## 2021-03-10 ENCOUNTER — Ambulatory Visit: Payer: Self-pay

## 2021-03-10 DIAGNOSIS — M25562 Pain in left knee: Secondary | ICD-10-CM

## 2021-03-25 ENCOUNTER — Other Ambulatory Visit (HOSPITAL_COMMUNITY): Payer: Self-pay

## 2021-03-25 MED FILL — Rosuvastatin Calcium Tab 10 MG: ORAL | 30 days supply | Qty: 30 | Fill #3 | Status: CN

## 2021-03-26 ENCOUNTER — Other Ambulatory Visit (HOSPITAL_COMMUNITY): Payer: Self-pay

## 2021-03-30 DIAGNOSIS — L658 Other specified nonscarring hair loss: Secondary | ICD-10-CM | POA: Diagnosis not present

## 2021-03-30 DIAGNOSIS — Z1283 Encounter for screening for malignant neoplasm of skin: Secondary | ICD-10-CM | POA: Diagnosis not present

## 2021-03-30 DIAGNOSIS — L218 Other seborrheic dermatitis: Secondary | ICD-10-CM | POA: Diagnosis not present

## 2021-04-06 ENCOUNTER — Other Ambulatory Visit: Payer: Self-pay | Admitting: Gastroenterology

## 2021-04-06 ENCOUNTER — Encounter: Payer: Self-pay | Admitting: Gastroenterology

## 2021-04-06 ENCOUNTER — Ambulatory Visit: Payer: Medicare Other | Admitting: Gastroenterology

## 2021-04-06 ENCOUNTER — Other Ambulatory Visit (HOSPITAL_COMMUNITY): Payer: Self-pay

## 2021-04-06 VITALS — BP 140/80 | HR 64 | Ht 70.0 in | Wt 195.0 lb

## 2021-04-06 DIAGNOSIS — K219 Gastro-esophageal reflux disease without esophagitis: Secondary | ICD-10-CM

## 2021-04-06 DIAGNOSIS — Z86010 Personal history of colonic polyps: Secondary | ICD-10-CM | POA: Diagnosis not present

## 2021-04-06 DIAGNOSIS — R159 Full incontinence of feces: Secondary | ICD-10-CM | POA: Diagnosis not present

## 2021-04-06 MED ORDER — PLENVU 140 G PO SOLR
1.0000 | Freq: Once | ORAL | 0 refills | Status: AC
  Filled 2021-04-06: qty 1, 1d supply, fill #0
  Filled 2021-04-06: qty 3, 7d supply, fill #0

## 2021-04-06 MED ORDER — FAMOTIDINE 40 MG PO TABS
40.0000 mg | ORAL_TABLET | Freq: Every day | ORAL | 11 refills | Status: DC
  Filled 2021-04-06: qty 30, 30d supply, fill #0
  Filled 2021-06-09: qty 30, 30d supply, fill #1
  Filled 2021-07-25: qty 30, 30d supply, fill #2
  Filled 2021-09-03: qty 30, 30d supply, fill #3

## 2021-04-06 NOTE — Patient Instructions (Addendum)
We have sent the following medications to your pharmacy for you to pick up at your convenience: famotidine.   Patient advised to avoid spicy, acidic, citrus, chocolate, mints, fruit and fruit juices.  Limit the intake of caffeine, alcohol and Soda.  Don't exercise too soon after eating.  Don't lie down within 3-4 hours of eating.  Elevate the head of your bed.   You have been scheduled for an endoscopy and colonoscopy. Please follow the written instructions given to you at your visit today. Please pick up your prep supplies at the pharmacy within the next 1-3 days. If you use inhalers (even only as needed), please bring them with you on the day of your procedure.  Normal BMI (Body Mass Index- based on height and weight) is between 23 and 30. Your BMI today is Body mass index is 27.98 kg/m. Marland Kitchen Please consider follow up  regarding your BMI with your Primary Care Provider.  The [facility] providers would like to encourage you to use [facility] to communicate with providers for non-urgent requests or questions.  Due to long hold times on the telephone, sending your provider a message by [facility] may be a faster and more efficient way to get a response.  Please allow 48 business hours for a response.  Please remember that this is for non-urgent requests.   Thank you for choosing me and [facility].  Pricilla Riffle. Dagoberto Ligas., MD., [facility]   Kegel Exercises  Kegel exercises can help strengthen your pelvic floor muscles. The pelvic floor is a group of muscles that support your rectum, small intestine, and bladder. In females, pelvic floor muscles also help support the womb (uterus). These muscles help you control the flow of urine and stool. Kegel exercises are painless and simple, and they do not require any equipment. Your provider may suggest Kegel exercises to: Improve bladder and bowel control. Improve sexual response. Improve weak pelvic floor muscles after surgery to remove the uterus  (hysterectomy) or pregnancy (females). Improve weak pelvic floor muscles after prostate gland removal or surgery (males). Kegel exercises involve squeezing your pelvic floor muscles, which are the same muscles you squeeze when you try to stop the flow of urine or keep from passing gas. The exercises can be done while sitting, standing, or lying down, but itis best to vary your position. Exercises How to do Kegel exercises: Squeeze your pelvic floor muscles tight. You should feel a tight lift in your rectal area. If you are a female, you should also feel a tightness in your vaginal area. Keep your stomach, buttocks, and legs relaxed. Hold the muscles tight for up to 10 seconds. Breathe normally. Relax your muscles. Repeat as told by your health care provider. Repeat this exercise daily as told by your health care provider. Continue to do this exercise for at least 4-6 weeks, or for as long as told by your healthcare provider. You may be referred to a physical therapist who can help you learn more abouthow to do Kegel exercises. Depending on your condition, your health care provider may recommend: Varying how long you squeeze your muscles. Doing several sets of exercises every day. Doing exercises for several weeks. Making Kegel exercises a part of your regular exercise routine. This information is not intended to replace advice given to you by your health care provider. Make sure you discuss any questions you have with your healthcare provider. Document Revised: 07/21/2020 Document Reviewed: 03/20/2018 Elsevier Patient Education  [place].

## 2021-04-06 NOTE — Progress Notes (Signed)
History of Present Illness: This is a 65 year old female referred by Lajean Manes, MD for the evaluation of fecal incontinence, GERD.  She relates worsening problems with reflux over the past few years.  Recently she has been taking pantoprazole on a daily basis and still has breakthrough symptoms.  She has a slight variation in bowel habits with occasional looser stools and occasional mild constipation.  Increased intake of fresh fruits leads to loose stools.  Increased intake of red meat and cheese leads to mild constipation.  When her stools are looser she has had episodes of fecal incontinence.  Since adjusting her diet these episodes have become less frequent.  Denies weight loss, abdominal pain, change in stool caliber, melena, hematochezia, nausea, vomiting, dysphagia, chest pain.    No Known Allergies Outpatient Medications Prior to Visit  Medication Sig Dispense Refill   Biotin 10 MG CAPS Take by mouth.     buPROPion (WELLBUTRIN XL) 150 MG 24 hr tablet bupropion HCl XL 150 mg 24 hr tablet, extended release     buPROPion (WELLBUTRIN XL) 300 MG 24 hr tablet Take 1 tablet by mouth once daily in the morning 90 tablet 2   Calcium-Magnesium-Vitamin D (CALCIUM MAGNESIUM PO) Take 1 tablet by mouth daily.     chlordiazePOXIDE (LIBRIUM) 10 MG capsule Take 10 mg by mouth daily.     Cholecalciferol (VITAMIN D3) 10000 UNITS capsule Take 10,000 Units by mouth daily.     fluorometholone (FML) 0.1 % ophthalmic suspension PLACE 1 DROP IN BOTH EYES 2 TIMES DAILY 5 mL 1   fluticasone (FLONASE) 50 MCG/ACT nasal spray Use 2 sprays into both nostrils daily as needed 16 g 1   glucosamine-chondroitin 500-400 MG tablet Take 1 tablet by mouth daily. Takes Osteo bi-flex; 1-2 a day     meloxicam (MOBIC) 15 MG tablet Take 1 tablet by mouth once daily 90 tablet 2   Menaquinone-7 (VITAMIN K2 PO) Take 1 tablet by mouth daily.     Multiple Vitamin (MULTIVITAMIN) tablet Take 1 tablet by mouth daily.        pantoprazole (PROTONIX) 40 MG tablet TAKE 1 TABLET BY MOUTH ONCE DAILY AS NEEDED 90 tablet 2   progesterone (PROMETRIUM) 100 MG capsule Take 1 capsule (100 mg total) by mouth daily. 90 capsule 3   rosuvastatin (CRESTOR) 10 MG tablet TAKE 1 TABLET BY MOUTH ONCE DAILY 30 tablet 11   traZODone (DESYREL) 50 MG tablet Take 2 tablets by mouth at bedtime as needed 180 tablet 2   UNABLE TO FIND Estrogen pellets     buPROPion (WELLBUTRIN XL) 300 MG 24 hr tablet TAKE 1 TABLET BY MOUTH ONCE DAILY EVERY MORNING 90 tablet 4   fluticasone (FLONASE) 50 MCG/ACT nasal spray INSTILL 2 SPRAYS INTO EACH NOSTRIL ONCE DAILY 16 g 0   pantoprazole (PROTONIX) 40 MG tablet TAKE 1 TABLET BY MOUTH ONCE A DAY AS NEEDED 90 tablet 3   rosuvastatin (CRESTOR) 10 MG tablet TAKE 1 TABLET BY MOUTH ONCE A DAY 90 tablet 2   traZODone (DESYREL) 50 MG tablet Take 2 tablets by mouth at bedtime as needed 60 tablet 0   traZODone (DESYREL) 50 MG tablet TAKE 1 TO 3 TABLETS BY MOUTH AT BEDTIME 270 tablet 4   traZODone (DESYREL) 50 MG tablet TAKE 1-3 TABLETS BY MOUTH AT BEDTIME 270 tablet 4   No facility-administered medications prior to visit.   Past Medical History:  Diagnosis Date   Anxiety    GERD (gastroesophageal  reflux disease)    Heart murmur    Hyperlipidemia    Insomnia    Mitral valve regurgitation    mild   Serrated adenoma of colon 2015   Sinusitis    Past Surgical History:  Procedure Laterality Date   BUNIONECTOMY     Right foot    DILATION AND CURETTAGE OF UTERUS     Social History   Socioeconomic History   Marital status: Divorced    Spouse name: Not on file   Number of children: 3   Years of education: Not on file   Highest education level: Not on file  Occupational History   Occupation: Programmer, multimedia: [employer]  Tobacco Use   Smoking status: Former   Smokeless tobacco: Never  Substance and Sexual Activity   Alcohol use: Yes    Comment: 1-2 drinks daily    Drug use: No   Sexual activity: Not  on file  Other Topics Concern   Not on file  Social History Narrative   Daily caffeine    Social Determinants of Health   Financial Resource Strain: Not on file  Food Insecurity: Not on file  Transportation Needs: Not on file  Physical Activity: Not on file  Stress: Not on file  Social Connections: Not on file   Family History  Problem Relation Age of Onset   Esophageal cancer Father    Stomach cancer Father    Diabetes Mother    Heart disease Sister    Colon cancer Neg Hx    Rectal cancer Neg Hx         Review of Systems: Pertinent positive and negative review of systems were noted in the above HPI section. All other review of systems were otherwise negative.   Physical Exam: General: Well developed, well nourished, no acute distress Head: Normocephalic and atraumatic Eyes: Sclerae anicteric, EOMI Ears: Normal auditory acuity Mouth: Not examined, mask on during Covid-19 pandemic Neck: Supple, no masses or thyromegaly Lungs: Clear throughout to auscultation Heart: Regular rate and rhythm; no murmurs, rubs or bruits Abdomen: Soft, non tender and non distended. No masses, hepatosplenomegaly or hernias noted. Normal Bowel sounds Rectal: Deferred to colonoscopy  Musculoskeletal: Symmetrical with no gross deformities  Skin: No lesions on visible extremities Pulses:  Normal pulses noted Extremities: No clubbing, cyanosis, edema or deformities noted Neurological: Alert oriented x 4, grossly nonfocal Cervical Nodes:  No significant cervical adenopathy Inguinal Nodes: No significant inguinal adenopathy Psychological:  Alert and cooperative. Normal mood and affect   Assessment and Recommendations:  Intermittent fecal incontinence. Symptoms increase when stools are looser. Moderate raw fruits and all other foods that lead to looser stools.  Defer DRE to colonoscopy.  Begin Kegel exercises 5 times daily.  Further evaluation with colonoscopy as per #2. Personal history of a  serrated sessile adenoma in 2015. She overdue for surveillance colonoscopy. The risks (including bleeding, perforation, infection, missed lesions, medication reactions and possible hospitalization or surgery if complications occur), benefits, and alternatives to colonoscopy with possible biopsy and possible polypectomy were discussed with the patient and they consent to proceed.   GERD. Family history of esophageal cancer.  Closely follow antireflux measures and continue pantoprazole 40 mg po qd. Begin famotidine 40 mg at bedtime.  Schedule EGD. The risks (including bleeding, perforation, infection, missed lesions, medication reactions and possible hospitalization or surgery if complications occur), benefits, and alternatives to endoscopy with possible biopsy and possible dilation were discussed with the patient and they consent  to proceed.     cc: Lajean Manes, MD [address] [place],  [place] [address]

## 2021-04-07 DIAGNOSIS — M25512 Pain in left shoulder: Secondary | ICD-10-CM | POA: Diagnosis not present

## 2021-04-20 ENCOUNTER — Ambulatory Visit: Payer: Medicare Other

## 2021-04-27 DIAGNOSIS — F5101 Primary insomnia: Secondary | ICD-10-CM | POA: Diagnosis not present

## 2021-04-27 DIAGNOSIS — R030 Elevated blood-pressure reading, without diagnosis of hypertension: Secondary | ICD-10-CM | POA: Diagnosis not present

## 2021-05-03 DIAGNOSIS — R1031 Right lower quadrant pain: Secondary | ICD-10-CM | POA: Diagnosis not present

## 2021-05-03 DIAGNOSIS — G44209 Tension-type headache, unspecified, not intractable: Secondary | ICD-10-CM | POA: Diagnosis not present

## 2021-05-04 ENCOUNTER — Telehealth: Payer: Self-pay | Admitting: Gastroenterology

## 2021-05-04 NOTE — Telephone Encounter (Signed)
Patient called states she is having symptoms of diverticulitis and was advised from her PCP to call our office.

## 2021-05-04 NOTE — Telephone Encounter (Signed)
If symptoms are resolving can proceed with colon/EGD as scheduled without an REV. If she does not improve please contact us.

## 2021-05-04 NOTE — Telephone Encounter (Signed)
Patient with a few day hx of diarrhea, excess gas, and right lower quad pain.  Pain has significantly improved and the other symptoms have resolved. She saw her PCP yesterday and they thought could possibly be diverticulitis.  She is asking if you need to see her again prior to her endo/colon in Oct?  I advised with the pain improving and the other symptoms resolved, did not think needed to be seen.  Dr. Fuller Plan please advise

## 2021-05-04 NOTE — Telephone Encounter (Signed)
Patient notified

## 2021-05-11 ENCOUNTER — Ambulatory Visit
Admission: RE | Admit: 2021-05-11 | Discharge: 2021-05-11 | Disposition: A | Discharge status: Home / Self Care | Payer: Medicare Other | Source: Ambulatory Visit | Attending: Geriatric Medicine | Admitting: Geriatric Medicine

## 2021-05-11 ENCOUNTER — Other Ambulatory Visit: Payer: Self-pay

## 2021-05-11 ENCOUNTER — Other Ambulatory Visit (HOSPITAL_COMMUNITY): Payer: Self-pay

## 2021-05-11 DIAGNOSIS — Z1231 Encounter for screening mammogram for malignant neoplasm of breast: Secondary | ICD-10-CM

## 2021-05-30 ENCOUNTER — Ambulatory Visit (AMBULATORY_SURGERY_CENTER): Payer: Medicare Other | Admitting: Gastroenterology

## 2021-05-30 ENCOUNTER — Encounter: Payer: Self-pay | Admitting: Gastroenterology

## 2021-05-30 ENCOUNTER — Other Ambulatory Visit: Payer: Self-pay

## 2021-05-30 VITALS — BP 100/58 | HR 57 | Temp 97.8°F | Resp 14 | Ht 70.0 in | Wt 195.0 lb

## 2021-05-30 DIAGNOSIS — Z860100 Personal history of colon polyps, unspecified: Secondary | ICD-10-CM

## 2021-05-30 DIAGNOSIS — K2970 Gastritis, unspecified, without bleeding: Secondary | ICD-10-CM | POA: Diagnosis not present

## 2021-05-30 DIAGNOSIS — R197 Diarrhea, unspecified: Secondary | ICD-10-CM

## 2021-05-30 DIAGNOSIS — K449 Diaphragmatic hernia without obstruction or gangrene: Secondary | ICD-10-CM

## 2021-05-30 DIAGNOSIS — K5140 Inflammatory polyps of colon without complications: Secondary | ICD-10-CM | POA: Diagnosis not present

## 2021-05-30 DIAGNOSIS — D122 Benign neoplasm of ascending colon: Secondary | ICD-10-CM | POA: Diagnosis not present

## 2021-05-30 DIAGNOSIS — K219 Gastro-esophageal reflux disease without esophagitis: Secondary | ICD-10-CM

## 2021-05-30 DIAGNOSIS — K2100 Gastro-esophageal reflux disease with esophagitis, without bleeding: Secondary | ICD-10-CM | POA: Diagnosis not present

## 2021-05-30 MED ORDER — SODIUM CHLORIDE 0.9 % IV SOLN: 500.0000 mL | Freq: Once | INTRAVENOUS | Status: DC

## 2021-05-30 MED ORDER — ONDANSETRON HCL 4 MG PO TABS
4.0000 mg | ORAL_TABLET | Freq: Once | ORAL | Status: AC
  Administered 2021-05-30: 4 mg via ORAL

## 2021-05-30 NOTE — Progress Notes (Signed)
Pt's states no medical or surgical changes since previsit or office visit. 

## 2021-05-30 NOTE — Patient Instructions (Addendum)
Handouts were given to your care partner on GERD with anti reflux measures,  a Hiatal Hernia, polyps, diverticulosis, and a high fiber diet with liberal fluid intake. You may resume your current medications today.  Stay on Pantoprazole 40 mg every am-  Best to take 10-29 minutes before food and Famotidine 40 mg at bed time. Await biopsy results.  May take 1-3 weeks to receive pathology results. Please call if any questions or concerns.      YOU HAD AN ENDOSCOPIC PROCEDURE TODAY AT [facility]:   Refer to the procedure report that was given to you for any specific questions about what was found during the examination.  If the procedure report does not answer your questions, please call your gastroenterologist to clarify.  If you requested that your care partner not be given the details of your procedure findings, then the procedure report has been included in a sealed envelope for you to review at your convenience later.  YOU SHOULD EXPECT: Some feelings of bloating in the abdomen. Passage of more gas than usual.  Walking can help get rid of the air that was put into your GI tract during the procedure and reduce the bloating. If you had a lower endoscopy (such as a colonoscopy or flexible sigmoidoscopy) you may notice spotting of blood in your stool or on the toilet paper. If you underwent a bowel prep for your procedure, you may not have a normal bowel movement for a few days.  Please Note:  You might notice some irritation and congestion in your nose or some drainage.  This is from the oxygen used during your procedure.  There is no need for concern and it should clear up in a day or so.  SYMPTOMS TO REPORT IMMEDIATELY:  Following lower endoscopy (colonoscopy or flexible sigmoidoscopy):  Excessive amounts of blood in the stool  Significant tenderness or worsening of abdominal pains  Swelling of the abdomen that is new, acute  Fever of 100F or higher  Following upper endoscopy  (EGD)  Vomiting of blood or coffee ground material  New chest pain or pain under the shoulder blades  Painful or persistently difficult swallowing  New shortness of breath  Fever of 100F or higher  Black, tarry-looking stools  For urgent or emergent issues, a gastroenterologist can be reached at any hour by calling [phone]. Do not use MyChart messaging for urgent concerns.    DIET:  We do recommend a small meal at first, but then you may proceed to your regular diet.  Drink plenty of fluids but you should avoid alcoholic beverages for 24 hours.  ACTIVITY:  You should plan to take it easy for the rest of today and you should NOT DRIVE or use heavy machinery until tomorrow (because of the sedation medicines used during the test).    FOLLOW UP: Our staff will call the number listed on your records 48-72 hours following your procedure to check on you and address any questions or concerns that you may have regarding the information given to you following your procedure. If we do not reach you, we will leave a message.  We will attempt to reach you two times.  During this call, we will ask if you have developed any symptoms of COVID 19. If you develop any symptoms (ie: fever, flu-like symptoms, shortness of breath, cough etc.) before then, please call [phone].  If you test positive for Covid 19 in the 2 weeks post procedure, please call  and report this information to [place].    If any biopsies were taken you will be contacted by phone or by letter within the next 1-3 weeks.  Please call us at [phone] if you have not heard about the biopsies in 3 weeks.    SIGNATURES/CONFIDENTIALITY: You and/or your care partner have signed paperwork which will be entered into your electronic medical record.  These signatures attest to the fact that that the information above on your After Visit Summary has been reviewed and is understood.  Full responsibility of the confidentiality of this discharge  information lies with you and/or your care-partner.

## 2021-05-30 NOTE — Progress Notes (Signed)
Called to room to assist during endoscopic procedure.  Patient ID and intended procedure confirmed with present staff. Received instructions for my participation in the procedure from the performing physician.  

## 2021-05-30 NOTE — Progress Notes (Signed)
Pt in recovery with monitors in place, VSS. Following verbal commands. Report given to receiving RN. Bite guard was placed with pt awake to ensure comfort. No dental or soft tissue damage noted.

## 2021-05-30 NOTE — Op Note (Signed)
[place] Patient Name: Megan Greer Procedure Date: 05/30/2021 7:58 AM MRN: [id number] Endoscopist: Ladene Artist , MD Age: 65 Referring MD:  Date of Birth: [birth year] Gender: Female Account #: [id number] Procedure:                Colonoscopy Indications:              High risk colon cancer surveillance: Personal                            history of traditional serrated adenoma of the                            colon, Diarrhea Medicines:                Monitored Anesthesia Care Procedure:                Pre-Anesthesia Assessment:                           - Prior to the procedure, a History and Physical                            was performed, and patient medications and                            allergies were reviewed. The patient's tolerance of                            previous anesthesia was also reviewed. The risks                            and benefits of the procedure and the sedation                            options and risks were discussed with the patient.                            All questions were answered, and informed consent                            was obtained. Prior Anticoagulants: The patient has                            taken no previous anticoagulant or antiplatelet                            agents. ASA Grade Assessment: II - A patient with                            mild systemic disease. After reviewing the risks                            and benefits, the patient was deemed in  satisfactory condition to undergo the procedure.                           After obtaining informed consent, the colonoscope                            was passed under direct vision. Throughout the                            procedure, the patient's blood pressure, pulse, and                            oxygen saturations were monitored continuously. The                            Olympus CF-HQ190L [id number]) Colonoscope was                             introduced through the anus and advanced to the the                            terminal ileum, with identification of the                            appendiceal orifice and IC valve. The terminal                            ileum, ileocecal valve, appendiceal orifice, and                            rectum were photographed. The quality of the bowel                            preparation was excellent. The colonoscopy was                            performed without difficulty. The patient tolerated                            the procedure well. Scope In: 8:11:20 AM Scope Out: 8:26:20 AM Scope Withdrawal Time: 0 hours 12 minutes 9 seconds  Total Procedure Duration: 0 hours 15 minutes 0 seconds  Findings:                 The perianal and digital rectal examinations were                            normal. Pertinent negatives include normal                            sphincter tone.                           The terminal ileum appeared normal.  A 4 mm polyp was found in the ascending colon. The                            polyp was sessile. The polyp was removed with a                            cold biopsy forceps. Resection and retrieval were                            complete.                           Multiple medium-mouthed diverticula were found in                            the left colon. There was no evidence of                            diverticular bleeding.                           One 5 mm mucosal nodule in a diverticulum was found                            in the sigmoid colon. Biopsies were taken with a                            cold forceps for histology.                           The exam was otherwise without abnormality on                            direct and retroflexion views. Random biopsies                            taken. Complications:            No immediate complications. Estimated blood loss:                             None. Estimated Blood Loss:     Estimated blood loss: none. Impression:               - The examined portion of the ileum was normal.                           - One 4 mm polyp in the ascending colon, removed                            with a cold forceps. Resected and retrieved.                           - Mild diverticulosis in the left colon.                           -  Mucosal nodule in a diverticulum in the sigmoid                            colon. Biopsied.                           - The examination was otherwise normal on direct                            and retroflexion views. Random biopsied taken. Recommendation:           - Repeat colonoscopy after studies are complete for                            surveillance based on pathology results.                           - Patient has a contact number available for                            emergencies. The signs and symptoms of potential                            delayed complications were discussed with the                            patient. Return to normal activities tomorrow.                            Written discharge instructions were provided to the                            patient.                           - Resume previous diet.                           - Continue present medications.                           - Await pathology results. Ladene Artist, MD 05/30/2021 8:39:26 AM This report has been signed electronically.

## 2021-05-30 NOTE — Op Note (Signed)
[place] Patient Name: Megan Greer Procedure Date: 05/30/2021 7:58 AM MRN: [id number] Endoscopist: Ladene Artist , MD Age: 65 Referring MD:  Date of Birth: 1955-09-30 Gender: Female Account #: [id number] Procedure:                Upper GI endoscopy Indications:              Gastroesophageal reflux disease Medicines:                Monitored Anesthesia Care Procedure:                Pre-Anesthesia Assessment:                           - Prior to the procedure, a History and Physical                            was performed, and patient medications and                            allergies were reviewed. The patient's tolerance of                            previous anesthesia was also reviewed. The risks                            and benefits of the procedure and the sedation                            options and risks were discussed with the patient.                            All questions were answered, and informed consent                            was obtained. Prior Anticoagulants: The patient has                            taken no previous anticoagulant or antiplatelet                            agents. ASA Grade Assessment: II - A patient with                            mild systemic disease. After reviewing the risks                            and benefits, the patient was deemed in                            satisfactory condition to undergo the procedure.                           After obtaining informed consent, the endoscope was  passed under direct vision. Throughout the                            procedure, the patient's blood pressure, pulse, and                            oxygen saturations were monitored continuously. The                            GIF [id number] was introduced through the                            mouth, and advanced to the second part of duodenum.                            The upper GI endoscopy  was accomplished without                            difficulty. The patient tolerated the procedure                            well. Scope In: Scope Out: Findings:                 LA Grade A (one or more mucosal breaks less than 5                            mm, not extending between tops of 2 mucosal folds)                            esophagitis with no bleeding was found 33 cm from                            the incisors.                           The Z-line was variable and was found 33-34 cm from                            the incisors. Biopsies were taken with a cold                            forceps for histology.                           The exam of the esophagus was otherwise normal.                           A small hiatal hernia was present.                           The exam of the stomach was otherwise normal.                           The  duodenal bulb and second portion of the                            duodenum were normal. Complications:            No immediate complications. Estimated Blood Loss:     Estimated blood loss was minimal. Impression:               - LA Grade A reflux esophagitis with no bleeding.                           - Z-line variable, 33-34 cm from the incisors.                            Biopsied.                           - Small hiatal hernia.                           - Normal duodenal bulb and second portion of the                            duodenum. Recommendation:           - Patient has a contact number available for                            emergencies. The signs and symptoms of potential                            delayed complications were discussed with the                            patient. Return to normal activities tomorrow.                            Written discharge instructions were provided to the                            patient.                           - Resume previous diet.                           - Follow  antireflux measures.                           - Continue present medications.                           - Await pathology results. Ladene Artist, MD 05/30/2021 8:44:57 AM This report has been signed electronically.

## 2021-05-30 NOTE — Progress Notes (Signed)
History & Physical  Primary Care Physician:  Lajean Manes, MD Primary Gastroenterologist: Lucio Edward, MD  CHIEF COMPLAINT: Diarrhea, personal history of colon polyps, GERD   HPI: Megan Greer is a 65 y.o. female with diarrhea occasional incontinence and history of colon polyps presenting for colonoscopy and EGD.   Past Medical History:  Diagnosis Date   Anxiety    Diverticulosis    GERD (gastroesophageal reflux disease)    Heart murmur    Hyperlipidemia    Insomnia    Mitral valve regurgitation    mild   Serrated adenoma of colon 2015   Sinusitis     Past Surgical History:  Procedure Laterality Date   BUNIONECTOMY     Right foot    DILATION AND CURETTAGE OF UTERUS      Prior to Admission medications   Medication Sig Start Date End Date Taking? Authorizing Provider  Biotin 10 MG CAPS Take by mouth.   Yes [provider]  buPROPion (WELLBUTRIN XL) 300 MG 24 hr tablet Take 1 tablet by mouth once daily in the morning 01/19/21  Yes   Calcium-Magnesium-Vitamin D (CALCIUM MAGNESIUM PO) Take 1 tablet by mouth daily.   Yes [provider]  Cholecalciferol (VITAMIN D3) 10000 UNITS capsule Take 10,000 Units by mouth daily.   Yes [provider]  famotidine (PEPCID) 40 MG tablet Take 1 tablet (40 mg total) by mouth once a day at bedtime. 04/06/21  Yes Ladene Artist, MD  fluorometholone (FML) 0.1 % ophthalmic suspension PLACE 1 DROP IN BOTH EYES 2 TIMES DAILY 09/08/20 09/08/21 Yes Katy Apo, MD  fluticasone [facility]) 50 MCG/ACT nasal spray Use 2 sprays into both nostrils daily as needed 12/23/20  Yes   Multiple Vitamin (MULTIVITAMIN) tablet Take 1 tablet by mouth daily.     Yes [provider]  pantoprazole (PROTONIX) 40 MG tablet TAKE 1 TABLET BY MOUTH ONCE DAILY AS NEEDED 01/19/21  Yes   rosuvastatin (CRESTOR) 10 MG tablet TAKE 1 TABLET BY MOUTH ONCE DAILY 05/14/20 05/30/21 Yes Stoneking, Hal, MD  traZODone (DESYREL) 50 MG tablet Take 2  tablets by mouth at bedtime as needed 01/19/21  Yes   buPROPion (WELLBUTRIN XL) 150 MG 24 hr tablet bupropion HCl XL 150 mg 24 hr tablet, extended release    [provider]  chlordiazePOXIDE (LIBRIUM) 10 MG capsule Take 10 mg by mouth daily.    [provider]  clobetasol (TEMOVATE) 0.05 % external solution Apply topically 2 (two) times daily as needed. 03/31/21   [provider]  glucosamine-chondroitin 500-400 MG tablet Take 1 tablet by mouth daily. Takes Osteo bi-flex; 1-2 a day Patient not taking: Reported on 05/30/2021    [provider]  Loteprednol Etabonate (LOTEMAX SM) 0.38 % GEL Lotemax SM 0.38 % eye gel drops    [provider]  meloxicam (MOBIC) 15 MG tablet Take 1 tablet by mouth once daily 01/19/21     Menaquinone-7 (VITAMIN K2 PO) Take 1 tablet by mouth daily.    [provider]  progesterone (PROMETRIUM) 100 MG capsule Take 1 capsule (100 mg total) by mouth daily. 11/21/11   [name], Doe-Hyun R, DO  UNABLE TO FIND Estrogen pellets    [provider]    Current Outpatient Medications  Medication Sig Dispense Refill   Biotin 10 MG CAPS Take by mouth.     buPROPion (WELLBUTRIN XL) 300 MG 24 hr tablet Take 1 tablet by mouth once daily in the morning 90 tablet  2   Calcium-Magnesium-Vitamin D (CALCIUM MAGNESIUM PO) Take 1 tablet by mouth daily.     Cholecalciferol (VITAMIN D3) 10000 UNITS capsule Take 10,000 Units by mouth daily.     famotidine (PEPCID) 40 MG tablet Take 1 tablet (40 mg total) by mouth once a day at bedtime. 30 tablet 11   fluorometholone (FML) 0.1 % ophthalmic suspension PLACE 1 DROP IN BOTH EYES 2 TIMES DAILY 5 mL 1   fluticasone (FLONASE) 50 MCG/ACT nasal spray Use 2 sprays into both nostrils daily as needed 16 g 1   Multiple Vitamin (MULTIVITAMIN) tablet Take 1 tablet by mouth daily.       pantoprazole (PROTONIX) 40 MG tablet TAKE 1 TABLET BY MOUTH ONCE DAILY AS NEEDED 90 tablet 2   rosuvastatin (CRESTOR) 10  MG tablet TAKE 1 TABLET BY MOUTH ONCE DAILY 30 tablet 11   traZODone (DESYREL) 50 MG tablet Take 2 tablets by mouth at bedtime as needed 180 tablet 2   buPROPion (WELLBUTRIN XL) 150 MG 24 hr tablet bupropion HCl XL 150 mg 24 hr tablet, extended release     chlordiazePOXIDE (LIBRIUM) 10 MG capsule Take 10 mg by mouth daily.     clobetasol (TEMOVATE) 0.05 % external solution Apply topically 2 (two) times daily as needed.     glucosamine-chondroitin 500-400 MG tablet Take 1 tablet by mouth daily. Takes Osteo bi-flex; 1-2 a day (Patient not taking: Reported on 05/30/2021)     Loteprednol Etabonate (LOTEMAX SM) 0.38 % GEL Lotemax SM 0.38 % eye gel drops     meloxicam (MOBIC) 15 MG tablet Take 1 tablet by mouth once daily 90 tablet 2   Menaquinone-7 (VITAMIN K2 PO) Take 1 tablet by mouth daily.     progesterone (PROMETRIUM) 100 MG capsule Take 1 capsule (100 mg total) by mouth daily. 90 capsule 3   UNABLE TO FIND Estrogen pellets     Current Facility-Administered Medications  Medication Dose Route Frequency Provider Last Rate Last Admin   0.9 %  sodium chloride infusion  500 mL Intravenous Once Ladene Artist, MD        Allergies as of 05/30/2021   (No Known Allergies)    Family History  Problem Relation Age of Onset   Esophageal cancer Father    Stomach cancer Father    Diabetes Mother    Heart disease Sister    Colon cancer Neg Hx    Rectal cancer Neg Hx     Social History   Socioeconomic History   Marital status: Divorced    Spouse name: Not on file   Number of children: 3   Years of education: Not on file   Highest education level: Not on file  Occupational History   Occupation: Programmer, multimedia: [employer]  Tobacco Use   Smoking status: Former   Smokeless tobacco: Never  Substance and Sexual Activity   Alcohol use: Yes    Comment: 1-2 drinks daily    Drug use: No   Sexual activity: Not on file  Other Topics Concern   Not on file  Social History Narrative   Daily  caffeine    Social Determinants of Health   Financial Resource Strain: Not on file  Food Insecurity: Not on file  Transportation Needs: Not on file  Physical Activity: Not on file  Stress: Not on file  Social Connections: Not on file  Intimate Partner Violence: Not on file    Review of Systems:  All systems reviewed  an negative except where noted in HPI.  Gen: Denies any fever, chills, sweats, anorexia, fatigue, weakness, malaise, weight loss, and sleep disorder CV: Denies chest pain, angina, palpitations, syncope, orthopnea, PND, peripheral edema, and claudication. Resp: Denies dyspnea at rest, dyspnea with exercise, cough, sputum, wheezing, coughing up blood, and pleurisy. GI: Denies vomiting blood, jaundice, and fecal incontinence.   Denies dysphagia or odynophagia. GU : Denies urinary burning, blood in urine, urinary frequency, urinary hesitancy, nocturnal urination, and urinary incontinence. MS: Denies joint pain, limitation of movement, and swelling, stiffness, low back pain, extremity pain. Denies muscle weakness, cramps, atrophy.  Derm: Denies rash, itching, dry skin, hives, moles, warts, or unhealing ulcers.  Psych: Denies depression, anxiety, memory loss, suicidal ideation, hallucinations, paranoia, and confusion. Heme: Denies bruising, bleeding, and enlarged lymph nodes. Neuro:  Denies any headaches, dizziness, paresthesias. Endo:  Denies any problems with DM, thyroid, adrenal function.   Physical Exam: General:  Alert, well-developed, in NAD Head:  Normocephalic and atraumatic. Eyes:  Sclera clear, no icterus.   Conjunctiva pink. Ears:  Normal auditory acuity. Mouth:  No deformity or lesions.  Neck:  Supple; no masses . Lungs:  Clear throughout to auscultation.   No wheezes, crackles, or rhonchi. No acute distress. Heart:  Regular rate and rhythm; no murmurs. Abdomen:  Soft, nondistended, nontender. No masses, hepatomegaly. No obvious masses.  Normal bowel .     Rectal:  Deferred   Msk:  Symmetrical without gross deformities.. Pulses:  Normal pulses noted. Extremities:  Without edema. Neurologic:  Alert and  oriented x4;  grossly normal neurologically. Skin:  Intact without significant lesions or rashes. Cervical Nodes:  No significant cervical adenopathy. Psych:  Alert and cooperative. Normal mood and affect.   Impression / Plan:     Diarrhea, personal history of sessile serrated adenomatous colon polyp and GERD for colonoscopy and EGD.     This patient is appropriate for endoscopic procedures in the ambulatory setting.    Pricilla Riffle. Fuller Plan  05/30/2021, 8:07 AM See Shea Evans, [facility], to contact our on call provider

## 2021-05-30 NOTE — Progress Notes (Signed)
When pt was alert she was in hand knee position and I patted her back to aid expelling flatus.  Pt requested  to go to the restroom to continue to pass flatus.  She pass flatus from mouth and rectum.  While pt was in the restroom she became nauseous.  Per Dr. Fuller Plan, give zofran 4 mg tab po.  Pt was given zofran at 09:25.  She requested to go home and said she felt better after belching out a good amount of gas with dry heave.  Pt was instructed to call me back today if sx do not continue to improve.  She said she would.

## 2021-06-01 ENCOUNTER — Telehealth: Payer: Self-pay | Admitting: *Deleted

## 2021-06-01 NOTE — Telephone Encounter (Signed)
  Follow up Call-  Call back number 05/30/2021  Post procedure Call Back phone  # [phone]  Permission to leave phone message Yes  Some recent data might be hidden   LMOM to call back with any questions or concerns.  Also, call back if patient has developed fever, respiratory issues or been dx with COVID or had any family members or close contacts diagnosed since her procedure.

## 2021-06-01 NOTE — Telephone Encounter (Signed)
  Follow up Call-  Call back number 05/30/2021  Post procedure Call Back phone  # [phone]  Permission to leave phone message Yes  Some recent data might be hidden     Patient questions:  Message left to call us if necessary.

## 2021-06-09 ENCOUNTER — Other Ambulatory Visit (HOSPITAL_COMMUNITY): Payer: Self-pay

## 2021-06-13 ENCOUNTER — Other Ambulatory Visit (HOSPITAL_COMMUNITY): Payer: Self-pay

## 2021-06-14 ENCOUNTER — Encounter: Payer: Self-pay | Admitting: Gastroenterology

## 2021-06-15 ENCOUNTER — Other Ambulatory Visit (HOSPITAL_COMMUNITY): Payer: Self-pay

## 2021-06-29 ENCOUNTER — Other Ambulatory Visit (HOSPITAL_COMMUNITY): Payer: Self-pay

## 2021-07-01 ENCOUNTER — Other Ambulatory Visit (HOSPITAL_COMMUNITY): Payer: Self-pay

## 2021-07-04 ENCOUNTER — Other Ambulatory Visit (HOSPITAL_COMMUNITY): Payer: Self-pay

## 2021-07-11 ENCOUNTER — Other Ambulatory Visit (HOSPITAL_COMMUNITY): Payer: Self-pay

## 2021-07-13 ENCOUNTER — Other Ambulatory Visit (HOSPITAL_COMMUNITY): Payer: Self-pay

## 2021-07-25 ENCOUNTER — Other Ambulatory Visit (HOSPITAL_COMMUNITY): Payer: Self-pay

## 2021-07-25 MED ORDER — ROSUVASTATIN CALCIUM 10 MG PO TABS
10.0000 mg | ORAL_TABLET | Freq: Every day | ORAL | 2 refills | Status: DC
  Filled 2021-07-25 – 2022-03-21 (×2): qty 90, 90d supply, fill #0
  Filled 2022-07-13: qty 90, 90d supply, fill #1

## 2021-08-16 ENCOUNTER — Other Ambulatory Visit (HOSPITAL_COMMUNITY): Payer: Self-pay

## 2021-08-17 DIAGNOSIS — L304 Erythema intertrigo: Secondary | ICD-10-CM | POA: Diagnosis not present

## 2021-08-17 DIAGNOSIS — D1801 Hemangioma of skin and subcutaneous tissue: Secondary | ICD-10-CM | POA: Diagnosis not present

## 2021-08-17 DIAGNOSIS — L738 Other specified follicular disorders: Secondary | ICD-10-CM | POA: Diagnosis not present

## 2021-08-17 DIAGNOSIS — L814 Other melanin hyperpigmentation: Secondary | ICD-10-CM | POA: Diagnosis not present

## 2021-08-17 DIAGNOSIS — L218 Other seborrheic dermatitis: Secondary | ICD-10-CM | POA: Diagnosis not present

## 2021-08-17 DIAGNOSIS — L821 Other seborrheic keratosis: Secondary | ICD-10-CM | POA: Diagnosis not present

## 2021-09-03 ENCOUNTER — Other Ambulatory Visit (HOSPITAL_COMMUNITY): Payer: Self-pay

## 2021-09-06 DIAGNOSIS — M79606 Pain in leg, unspecified: Secondary | ICD-10-CM | POA: Diagnosis not present

## 2021-09-06 DIAGNOSIS — R0602 Shortness of breath: Secondary | ICD-10-CM | POA: Diagnosis not present

## 2021-09-14 DIAGNOSIS — H524 Presbyopia: Secondary | ICD-10-CM | POA: Diagnosis not present

## 2021-09-14 DIAGNOSIS — H04123 Dry eye syndrome of bilateral lacrimal glands: Secondary | ICD-10-CM | POA: Diagnosis not present

## 2021-09-14 DIAGNOSIS — H2513 Age-related nuclear cataract, bilateral: Secondary | ICD-10-CM | POA: Diagnosis not present

## 2021-09-14 DIAGNOSIS — H01001 Unspecified blepharitis right upper eyelid: Secondary | ICD-10-CM | POA: Diagnosis not present

## 2021-10-03 ENCOUNTER — Other Ambulatory Visit (HOSPITAL_COMMUNITY): Payer: Self-pay

## 2021-10-03 ENCOUNTER — Other Ambulatory Visit: Payer: Self-pay | Admitting: Gastroenterology

## 2021-10-03 MED ORDER — FAMOTIDINE 40 MG PO TABS
40.0000 mg | ORAL_TABLET | Freq: Every day | ORAL | 5 refills | Status: DC
  Filled 2021-10-03: qty 30, 30d supply, fill #0
  Filled 2021-11-09: qty 30, 30d supply, fill #1
  Filled 2021-12-21: qty 30, 30d supply, fill #2
  Filled 2022-01-13: qty 14, 14d supply, fill #3
  Filled 2022-01-13: qty 16, 16d supply, fill #3
  Filled 2022-02-24: qty 30, 30d supply, fill #4
  Filled 2022-04-24: qty 30, 30d supply, fill #5

## 2021-10-04 ENCOUNTER — Other Ambulatory Visit (HOSPITAL_COMMUNITY): Payer: Self-pay

## 2021-11-09 ENCOUNTER — Other Ambulatory Visit (HOSPITAL_COMMUNITY): Payer: Self-pay

## 2021-11-22 ENCOUNTER — Other Ambulatory Visit (HOSPITAL_COMMUNITY): Payer: Self-pay

## 2021-11-22 MED ORDER — TRAZODONE HCL 50 MG PO TABS
ORAL_TABLET | ORAL | 0 refills | Status: DC
  Filled 2021-11-22: qty 90, 30d supply, fill #0

## 2021-11-22 MED ORDER — TRAZODONE HCL 50 MG PO TABS
ORAL_TABLET | ORAL | 2 refills | Status: DC
  Filled 2021-11-22: qty 180, 90d supply, fill #0

## 2021-11-23 ENCOUNTER — Other Ambulatory Visit (HOSPITAL_COMMUNITY): Payer: Self-pay

## 2021-11-25 ENCOUNTER — Other Ambulatory Visit (HOSPITAL_COMMUNITY): Payer: Self-pay

## 2021-11-25 MED ORDER — BUPROPION HCL ER (XL) 300 MG PO TB24
ORAL_TABLET | ORAL | 4 refills | Status: DC
  Filled 2021-11-25: qty 90, 90d supply, fill #0
  Filled 2022-03-20: qty 90, 90d supply, fill #1
  Filled 2022-07-13: qty 90, 90d supply, fill #2
  Filled 2022-10-26: qty 90, 90d supply, fill #3

## 2021-11-25 MED ORDER — TRAZODONE HCL 50 MG PO TABS
ORAL_TABLET | ORAL | 4 refills | Status: DC
  Filled 2022-01-03: qty 270, 90d supply, fill #0
  Filled 2022-05-14: qty 270, 90d supply, fill #1
  Filled 2022-09-13 (×2): qty 270, 90d supply, fill #2

## 2021-11-25 MED ORDER — GUANFACINE HCL ER 1 MG PO TB24
ORAL_TABLET | ORAL | 0 refills | Status: DC
  Filled 2021-11-25: qty 30, 30d supply, fill #0

## 2021-11-26 ENCOUNTER — Other Ambulatory Visit (HOSPITAL_COMMUNITY): Payer: Self-pay

## 2021-12-21 ENCOUNTER — Other Ambulatory Visit (HOSPITAL_COMMUNITY): Payer: Self-pay

## 2022-01-03 ENCOUNTER — Other Ambulatory Visit (HOSPITAL_COMMUNITY): Payer: Self-pay

## 2022-01-13 ENCOUNTER — Other Ambulatory Visit (HOSPITAL_COMMUNITY): Payer: Self-pay

## 2022-01-13 MED ORDER — PANTOPRAZOLE SODIUM 40 MG PO TBEC
40.0000 mg | DELAYED_RELEASE_TABLET | Freq: Every day | ORAL | 3 refills | Status: DC | PRN
  Filled 2022-01-13: qty 90, 90d supply, fill #0
  Filled 2022-05-15: qty 90, 90d supply, fill #1
  Filled 2022-09-11: qty 90, 90d supply, fill #2

## 2022-01-14 ENCOUNTER — Other Ambulatory Visit (HOSPITAL_COMMUNITY): Payer: Self-pay

## 2022-01-16 ENCOUNTER — Other Ambulatory Visit (HOSPITAL_COMMUNITY): Payer: Self-pay

## 2022-01-16 MED ORDER — FLUTICASONE PROPIONATE 50 MCG/ACT NA SUSP
NASAL | 3 refills | Status: AC
  Filled 2022-01-16: qty 16, 30d supply, fill #0

## 2022-01-26 DIAGNOSIS — J209 Acute bronchitis, unspecified: Secondary | ICD-10-CM | POA: Diagnosis not present

## 2022-01-31 DIAGNOSIS — J40 Bronchitis, not specified as acute or chronic: Secondary | ICD-10-CM | POA: Diagnosis not present

## 2022-02-07 ENCOUNTER — Other Ambulatory Visit (HOSPITAL_COMMUNITY): Payer: Self-pay

## 2022-02-07 DIAGNOSIS — R0602 Shortness of breath: Secondary | ICD-10-CM | POA: Diagnosis not present

## 2022-02-07 DIAGNOSIS — Z8639 Personal history of other endocrine, nutritional and metabolic disease: Secondary | ICD-10-CM | POA: Diagnosis not present

## 2022-02-07 DIAGNOSIS — E7800 Pure hypercholesterolemia, unspecified: Secondary | ICD-10-CM | POA: Diagnosis not present

## 2022-02-07 DIAGNOSIS — R7303 Prediabetes: Secondary | ICD-10-CM | POA: Diagnosis not present

## 2022-02-07 DIAGNOSIS — R5383 Other fatigue: Secondary | ICD-10-CM | POA: Diagnosis not present

## 2022-02-07 DIAGNOSIS — M1990 Unspecified osteoarthritis, unspecified site: Secondary | ICD-10-CM | POA: Diagnosis not present

## 2022-02-07 MED ORDER — OZEMPIC (0.25 OR 0.5 MG/DOSE) 2 MG/3ML ~~LOC~~ SOPN
PEN_INJECTOR | SUBCUTANEOUS | 0 refills | Status: DC
  Filled 2022-02-07: qty 3, 42d supply, fill #0

## 2022-02-08 ENCOUNTER — Other Ambulatory Visit (HOSPITAL_COMMUNITY): Payer: Self-pay

## 2022-02-24 ENCOUNTER — Other Ambulatory Visit (HOSPITAL_COMMUNITY): Payer: Self-pay

## 2022-02-24 DIAGNOSIS — Z0000 Encounter for general adult medical examination without abnormal findings: Secondary | ICD-10-CM | POA: Diagnosis not present

## 2022-02-24 DIAGNOSIS — E7800 Pure hypercholesterolemia, unspecified: Secondary | ICD-10-CM | POA: Diagnosis not present

## 2022-02-24 DIAGNOSIS — R7303 Prediabetes: Secondary | ICD-10-CM | POA: Diagnosis not present

## 2022-02-24 DIAGNOSIS — K219 Gastro-esophageal reflux disease without esophagitis: Secondary | ICD-10-CM | POA: Diagnosis not present

## 2022-02-24 DIAGNOSIS — K909 Intestinal malabsorption, unspecified: Secondary | ICD-10-CM | POA: Diagnosis not present

## 2022-02-24 DIAGNOSIS — Z23 Encounter for immunization: Secondary | ICD-10-CM | POA: Diagnosis not present

## 2022-02-24 DIAGNOSIS — Z79899 Other long term (current) drug therapy: Secondary | ICD-10-CM | POA: Diagnosis not present

## 2022-02-27 DIAGNOSIS — R7303 Prediabetes: Secondary | ICD-10-CM | POA: Diagnosis not present

## 2022-02-27 DIAGNOSIS — K5903 Drug induced constipation: Secondary | ICD-10-CM | POA: Diagnosis not present

## 2022-02-28 ENCOUNTER — Other Ambulatory Visit (HOSPITAL_COMMUNITY): Payer: Self-pay

## 2022-02-28 MED ORDER — OZEMPIC (0.25 OR 0.5 MG/DOSE) 2 MG/3ML ~~LOC~~ SOPN
PEN_INJECTOR | SUBCUTANEOUS | 1 refills | Status: DC
  Filled 2022-02-28: qty 3, 28d supply, fill #0
  Filled 2022-04-24: qty 3, 28d supply, fill #1

## 2022-03-01 ENCOUNTER — Other Ambulatory Visit (HOSPITAL_COMMUNITY): Payer: Self-pay

## 2022-03-14 DIAGNOSIS — Z8639 Personal history of other endocrine, nutritional and metabolic disease: Secondary | ICD-10-CM | POA: Diagnosis not present

## 2022-03-14 DIAGNOSIS — K219 Gastro-esophageal reflux disease without esophagitis: Secondary | ICD-10-CM | POA: Diagnosis not present

## 2022-03-14 DIAGNOSIS — K5903 Drug induced constipation: Secondary | ICD-10-CM | POA: Diagnosis not present

## 2022-03-14 DIAGNOSIS — R7303 Prediabetes: Secondary | ICD-10-CM | POA: Diagnosis not present

## 2022-03-20 ENCOUNTER — Other Ambulatory Visit (HOSPITAL_COMMUNITY): Payer: Self-pay

## 2022-03-21 ENCOUNTER — Other Ambulatory Visit (HOSPITAL_COMMUNITY): Payer: Self-pay

## 2022-03-23 DIAGNOSIS — G8929 Other chronic pain: Secondary | ICD-10-CM | POA: Diagnosis not present

## 2022-03-23 DIAGNOSIS — M7632 Iliotibial band syndrome, left leg: Secondary | ICD-10-CM | POA: Diagnosis not present

## 2022-03-23 DIAGNOSIS — M25562 Pain in left knee: Secondary | ICD-10-CM | POA: Diagnosis not present

## 2022-03-23 DIAGNOSIS — M25462 Effusion, left knee: Secondary | ICD-10-CM | POA: Diagnosis not present

## 2022-03-27 IMAGING — DX DG KNEE COMPLETE 4+V*L*
4 series · 4 of 4 positions shown · non-contrast
Comparison: None.

CLINICAL DATA: Fall, twisted knee

EXAM:
LEFT KNEE - COMPLETE 4+ VIEW

[knee pa]
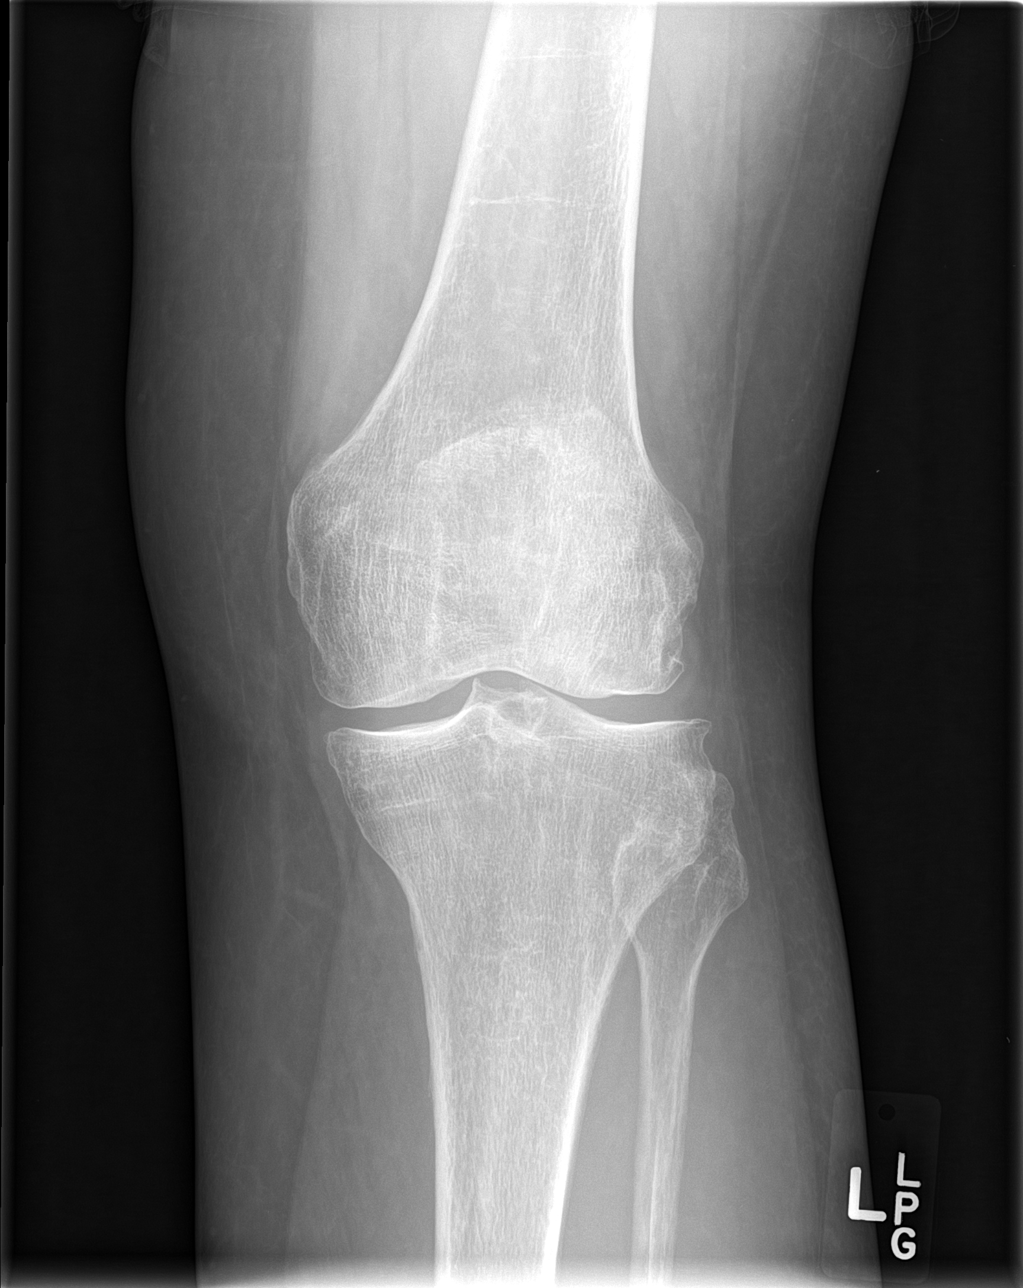

[knee obl (1 of 2)]
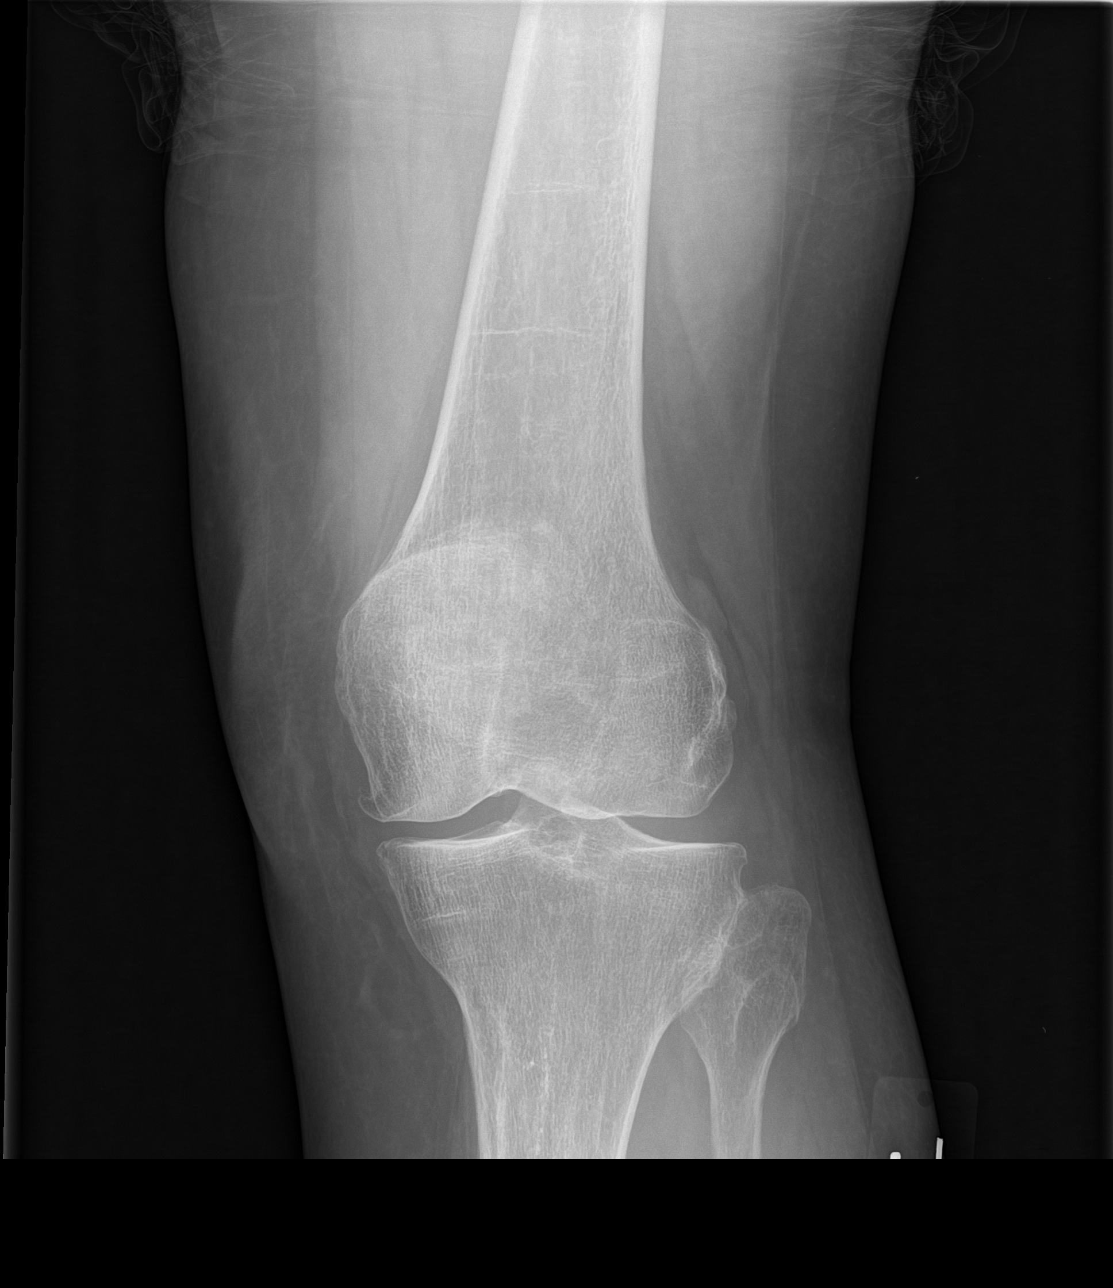

[knee obl (2 of 2)]
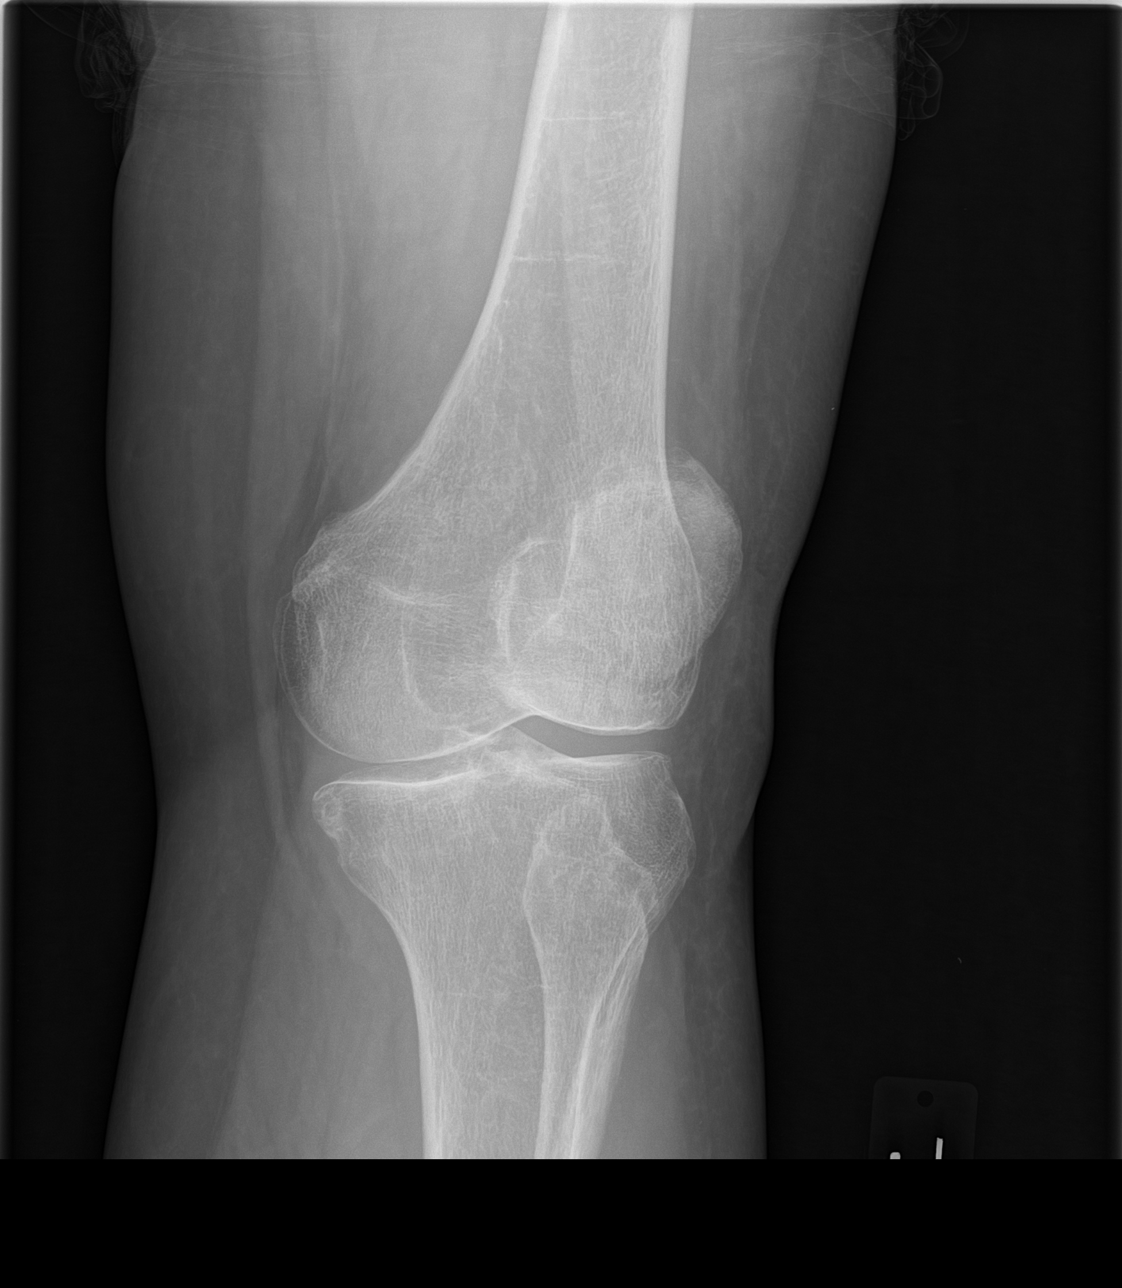

[knee lat]
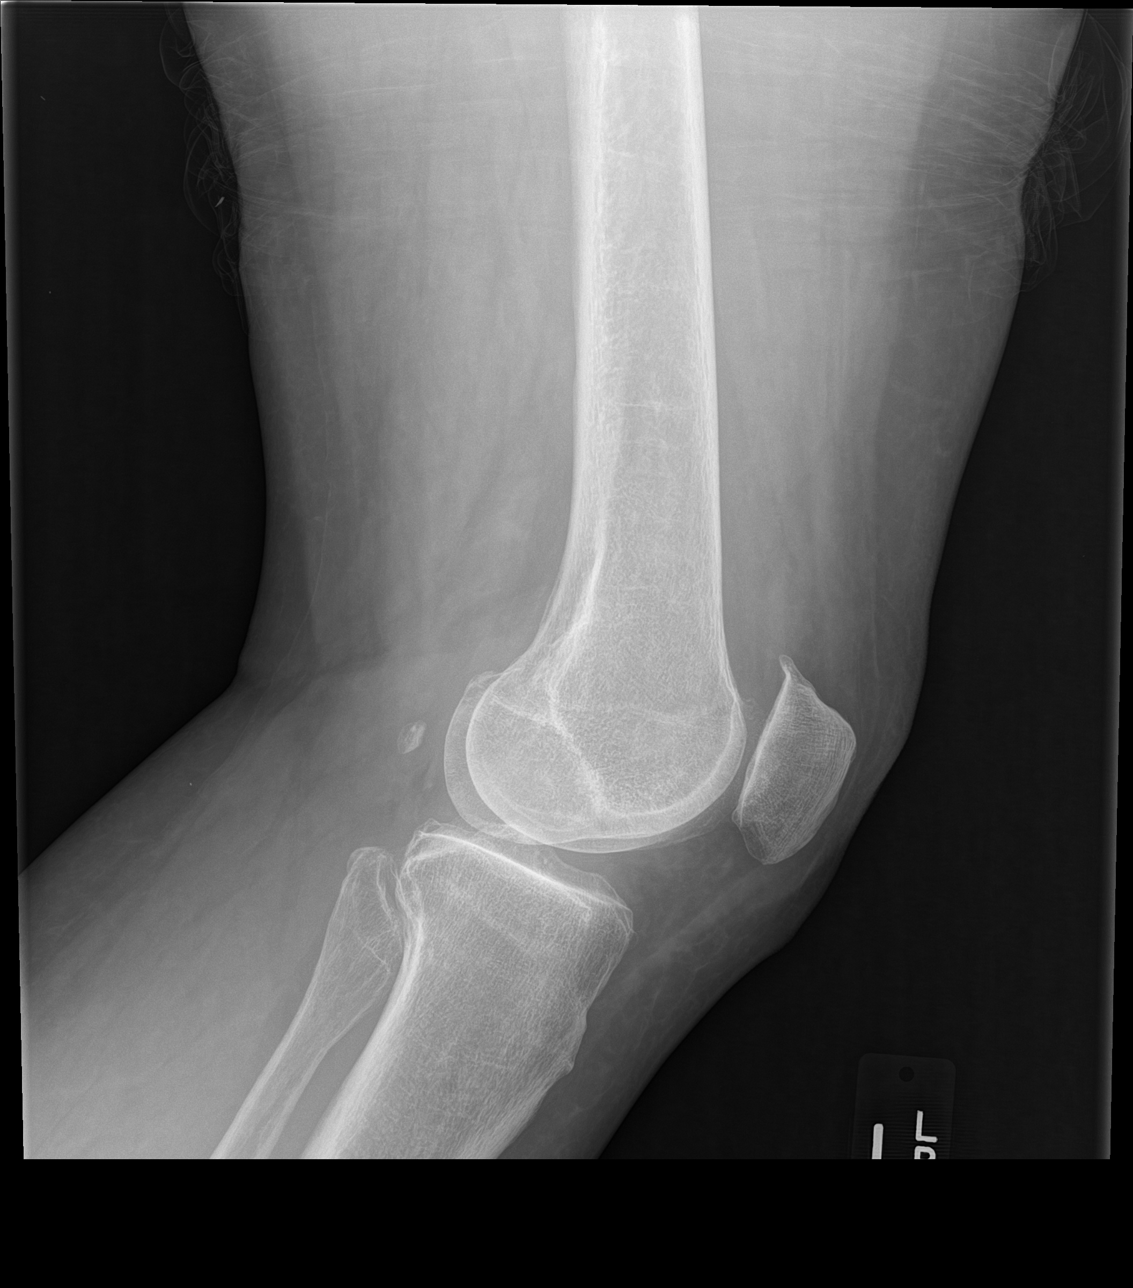

[4 of 4 positions shown; findings below may reference images not displayed]

FINDINGS: No acute bony abnormality. Specifically, no fracture, subluxation,
or dislocation. Mild spurring in the patellofemoral and lateral
compartments. Joint spaces are maintained. No joint effusion.
IMPRESSION: No acute bony abnormality.

## 2022-03-28 ENCOUNTER — Other Ambulatory Visit: Payer: Self-pay

## 2022-03-28 ENCOUNTER — Encounter: Payer: Self-pay | Admitting: Physical Therapy

## 2022-03-28 ENCOUNTER — Ambulatory Visit: Payer: Medicare Other | Attending: Adult Reconstructive Orthopaedic Surgery | Admitting: Physical Therapy

## 2022-03-28 DIAGNOSIS — M25562 Pain in left knee: Secondary | ICD-10-CM | POA: Diagnosis not present

## 2022-03-28 DIAGNOSIS — G8929 Other chronic pain: Secondary | ICD-10-CM | POA: Insufficient documentation

## 2022-03-28 DIAGNOSIS — R2681 Unsteadiness on feet: Secondary | ICD-10-CM | POA: Diagnosis not present

## 2022-03-28 DIAGNOSIS — M6281 Muscle weakness (generalized): Secondary | ICD-10-CM | POA: Insufficient documentation

## 2022-03-28 DIAGNOSIS — M25552 Pain in left hip: Secondary | ICD-10-CM | POA: Diagnosis not present

## 2022-03-28 NOTE — Therapy (Signed)
OUTPATIENT PHYSICAL THERAPY LOWER EXTREMITY EVALUATION   Patient Name: Megan Greer MRN: [id number] DOB:01/13/1956, 66 y.o., female Today's Date: 03/28/2022   PT End of Session - 03/28/22 1150     Visit Number 1    Date for PT Re-Evaluation 05/23/22    Authorization Type [employer]    PT Start Time 8588    PT Stop Time 1228    PT Time Calculation (min) 43 min    Activity Tolerance Patient tolerated treatment well             Past Medical History:  Diagnosis Date   Anxiety    Diverticulosis    GERD (gastroesophageal reflux disease)    Heart murmur    Hyperlipidemia    Insomnia    Mitral valve regurgitation    mild   Serrated adenoma of colon 2015   Sinusitis    Past Surgical History:  Procedure Laterality Date   BUNIONECTOMY     Right foot    DILATION AND CURETTAGE OF UTERUS     Patient Active Problem List   Diagnosis Date Noted   Abdominal pain, chronic, right lower quadrant 08/13/2013   Unspecified hypothyroidism 05/06/2013   Transaminitis 01/18/2012   Anxiety and depression 11/17/2011   Preventative health care 11/17/2011   Osteopenia 11/17/2011   Chronic insomnia 11/17/2011   Hyperlipidemia 11/17/2011   Gastroenteritis 11/17/2011    PCP: Lajean Manes MD  REFERRING PROVIDER: Rosanne Ashing MD  REFERRING DIAG: iliotibial band syndrome, left leg  THERAPY DIAG:  Hip pain; weakness  Rationale for Evaluation and Treatment Rehabilitation  ONSET DATE: 08/14/21  SUBJECTIVE:   SUBJECTIVE STATEMENT: 4-5 year history, pop during ex class; another incident of plant/twist crutches 1 day;  saw Dr. Theda Sers at [place]; had surgery for meniscal debridement.  No relief of pain and thought it was nerve pain.  Had PT 1 year ago but I didn't keep up with the exercises.  Swelling in lower leg/ankle.  Usually wear compression stockings.    PERTINENT HISTORY: Mother passed away 3 weeks ago; 1 injection in knee in past no improvement;  had previous PT here for hip   Osteopenia  PAIN:  Are you having pain? Yes NPRS scale: 2-3/10 Pain location: left lateral knee ; a little swelling on back of knee Aggravating factors: up and down stairs, tried 10 min on new home bike and it aggravated; standing at work; squatting Relieving factors: elevated on wedge; ice  PRECAUTIONS: None  WEIGHT BEARING RESTRICTIONS No  FALLS:  Has patient fallen in last 6 months? No  LIVING ENVIRONMENT: Lives with: lives with their spouse Lives in: House/apartment Stairs: ranch home but some steps to higher room  OCCUPATION: works 2x/week on [employer] all day  PLOF: Independent  PATIENT GOALS  A plan for what I need to do;  not take Mobic all the time;  treat with ex's   OBJECTIVE:   DIAGNOSTIC FINDINGS: MRI 2018  (Had more recently at [facility]) IMPRESSION: 1. Grade 3 oblique tear posterior horn medial meniscus extending to the inferior meniscal surface. 2. 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 This is most severe in the patellofemoral joint. 3. Small knee effusion with small to moderate-sized Baker's cyst and mild semimembranosus-tibial collateral ligament bursitis. 4. Degenerative signal in the midportion of the PCL without overt tear.  PATIENT SURVEYS:  LEFS:   COGNITION:  Overall cognitive status: Within functional limits for tasks assessed     MUSCLE LENGTH: Hamstrings: Right 70 deg; Left 70  deg Marcello Moores test: Right 10 deg; Left 10 deg  POSTURE:  mild edema posterior knee; moderate ankle edema  PALPATION: Tender point lateral joint line, distal ITB, distal lateral quads, distal lateral HS  LOWER EXTREMITY ROM:  Active ROM Right eval Left eval  Hip flexion [facility]  Hip extension    Hip abduction    Hip adduction    Hip internal rotation    Hip external rotation    Knee flexion 150 130  Knee extension 0 0  Ankle dorsiflexion    Ankle plantarflexion    Ankle inversion    Ankle eversion     (Blank rows = not  tested)  LOWER EXTREMITY MMT:    MMT Right eval Left eval  Hip flexion 5 5  Hip extension 5 5  Hip abduction 4 4  Hip adduction    Hip internal rotation    Hip external rotation    Knee flexion 5 4  Knee extension 5 4  Ankle dorsiflexion    Ankle plantarflexion    Ankle inversion    Ankle eversion 5 4 pain   (Blank rows = not tested)  LOWER EXTREMITY SPECIAL TESTS:  SLS >10 sec on left but multiple compensations at the ankle, knee, hip very shaky  FUNCTIONAL TESTS:  Avoids squatting (bends at hips to pick up small items)  GAIT:  Comments: WNLs    TODAY'S TREATMENT: Treatment plan   PATIENT EDUCATION:  Education details: treatment plan Person educated: Patient Education method: Explanation Education comprehension: verbalized understanding   HOME EXERCISE PROGRAM: To be started next visit  ASSESSMENT:  CLINICAL IMPRESSION: Patient is a 66 y.o. female who was seen today for physical therapy evaluation and treatment for left iliotibial band syndrome. The pain is on the lateral aspect of her knee and aggravated with going up and down stairs, standing at work and squatting.  The patient demonstrates decreased knee flexion  range of motion.  Strength deficits and asymmetry with opposite knee include decreased quad strength and gluteal strength particularly glute medius muscle.  Decreased muscle length noted in hamstrings.  These deficits and pain cause functional impairments with standing, walking, going up and down curbs and steps at home and at work. OBJECTIVE IMPAIRMENTS decreased activity tolerance, decreased mobility, decreased ROM, decreased strength, increased edema, increased fascial restrictions, impaired flexibility, and pain.   ACTIVITY LIMITATIONS standing, squatting, and stairs  PARTICIPATION LIMITATIONS: shopping, community activity, and occupation  PERSONAL FACTORS Time since onset of injury/illness/exacerbation and 1 comorbidity: osteopenia  are also  affecting patient's functional outcome.   REHAB POTENTIAL: Good  CLINICAL DECISION MAKING: Stable/uncomplicated  EVALUATION COMPLEXITY: Low   GOALS: Goals reviewed with patient? Yes  SHORT TERM GOALS: Target date: 04/25/2022  The patient will demonstrate knowledge of basic self care strategies and exercises to promote healing   Baseline: Goal status: INITIAL  2.  The patient will report a 40% improvement in left lateral knee pain level with functional activities including going up/down stairs and standing Baseline:  Goal status: INITIAL  3.  Improved knee flexion ROM to 135 degrees needed for greater ease with going up/down stairs Baseline:  Goal status: INITIAL  4.  The patient will have improved hip and knee strength to at least 4+/5 needed for standing, walking longer distances and descending stairs at home and in the community  Baseline:  Goal status: INITIAL   LONG TERM GOALS: Target date: 05/23/2022   The patient will be independent in a safe self progression of  a home exercise program to promote further recovery of function   Baseline:  Goal status: INITIAL  2.  The patient will report a 75% improvement in pain levels with functional activities which are currently difficult including  Baseline:  Goal status: INITIAL  3.  The patient will have improved hip, knee and ankle eversion strength to at least 5-/5 needed for standing, walking longer distances and descending stairs at home and in the community  Baseline:  Goal status: INITIAL  4.  The patient will be able to walk 1 mile with pain level <5/10 Baseline:  Goal status: INITIAL  5.  The patient will have improved LEFS score to   45/80    indicating improved function with less pain  Baseline:  Goal status: INITIAL   PLAN: PT FREQUENCY: 2x/week  PT DURATION: 8 weeks  PLANNED INTERVENTIONS: Therapeutic exercises, Therapeutic activity, Neuromuscular re-education, Patient/Family education, Self Care, Joint  mobilization, Aquatic Therapy, Dry Needling, Electrical stimulation, Cryotherapy, Moist heat, Taping, Ultrasound, Ionotophoresis '4mg'$ /ml Dexamethasone, Manual therapy, and Re-evaluation  PLAN FOR NEXT SESSION: ionto if cert signed; DN to distal ITB, lateral quads, lateral HS, peroneals;  start HEP: HS stretch with strap with bias, standing ITB stretch; sidelying clam and/or hip abduction  Ruben Im, PT 03/28/22 7:30 PM Phone: [phone] Fax: [phone]

## 2022-03-29 ENCOUNTER — Ambulatory Visit: Payer: Medicare Other | Admitting: Physical Therapy

## 2022-03-29 DIAGNOSIS — M25552 Pain in left hip: Secondary | ICD-10-CM | POA: Diagnosis not present

## 2022-03-29 DIAGNOSIS — G8929 Other chronic pain: Secondary | ICD-10-CM | POA: Diagnosis not present

## 2022-03-29 DIAGNOSIS — R2681 Unsteadiness on feet: Secondary | ICD-10-CM | POA: Diagnosis not present

## 2022-03-29 DIAGNOSIS — M6281 Muscle weakness (generalized): Secondary | ICD-10-CM

## 2022-03-29 DIAGNOSIS — M25562 Pain in left knee: Secondary | ICD-10-CM | POA: Diagnosis not present

## 2022-03-29 NOTE — Patient Instructions (Signed)

## 2022-03-29 NOTE — Therapy (Signed)
OUTPATIENT PHYSICAL THERAPY LOWER EXTREMITY PROGRESS NOTE   Patient Name: Megan Greer MRN: [id number] DOB:09-20-1955, 66 y.o., female Today's Date: 03/29/2022   PT End of Session - 03/29/22 0757     Visit Number 2    Date for PT Re-Evaluation 05/23/22    Authorization Type [employer]    PT Start Time 0800    PT Stop Time 0840    PT Time Calculation (min) 40 min    Activity Tolerance Patient tolerated treatment well             Past Medical History:  Diagnosis Date   Anxiety    Diverticulosis    GERD (gastroesophageal reflux disease)    Heart murmur    Hyperlipidemia    Insomnia    Mitral valve regurgitation    mild   Serrated adenoma of colon 2015   Sinusitis    Past Surgical History:  Procedure Laterality Date   BUNIONECTOMY     Right foot    DILATION AND CURETTAGE OF UTERUS     Patient Active Problem List   Diagnosis Date Noted   Abdominal pain, chronic, right lower quadrant 08/13/2013   Unspecified hypothyroidism 05/06/2013   Transaminitis 01/18/2012   Anxiety and depression 11/17/2011   Preventative health care 11/17/2011   Osteopenia 11/17/2011   Chronic insomnia 11/17/2011   Hyperlipidemia 11/17/2011   Gastroenteritis 11/17/2011    PCP: Lajean Manes MD  REFERRING PROVIDER: Rosanne Ashing MD  REFERRING DIAG: iliotibial band syndrome, left leg  THERAPY DIAG:  Hip pain; weakness  Rationale for Evaluation and Treatment Rehabilitation  ONSET DATE: 08/14/21  SUBJECTIVE:   SUBJECTIVE STATEMENT: I could feel it even from moving around on the table yesterday.  I haven't been exercising and I can tell.   PERTINENT HISTORY: Mother passed away 3 weeks ago; 1 injection in knee in past no improvement;  had previous PT here for hip  Osteopenia Wants to start [employer] group class 4-5 year history, pop during ex class; another incident of plant/twist crutches 1 day;  saw Dr. Theda Sers at [place]; had surgery for meniscal debridement.  No relief  of pain and thought it was nerve pain.  Had PT 1 year ago but I didn't keep up with the exercises.  Swelling in lower leg/ankle.  Usually wear compression stockings.    PAIN:  Are you having pain? Yes NPRS scale: 2-3/10 Pain location: left lateral knee ; a little swelling on back of knee Aggravating factors: up and down stairs, tried 10 min on new home bike and it aggravated; standing at work; squatting Relieving factors: elevated on wedge; ice  PRECAUTIONS: None  WEIGHT BEARING RESTRICTIONS No  FALLS:  Has patient fallen in last 6 months? No  LIVING ENVIRONMENT: Lives with: lives with their spouse Lives in: House/apartment Stairs: ranch home but some steps to higher room  OCCUPATION: works 2x/week on [employer] all day  PLOF: Independent  PATIENT GOALS  A plan for what I need to do;  not take Mobic all the time;  treat with ex's   OBJECTIVE:   DIAGNOSTIC FINDINGS: MRI 2018  (Had more recently at [facility]) IMPRESSION: 1. Grade 3 oblique tear posterior horn medial meniscus extending to the inferior meniscal surface. 2. 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 This is most severe in the patellofemoral joint. 3. Small knee effusion with small to moderate-sized Baker's cyst and mild semimembranosus-tibial collateral ligament bursitis. 4. Degenerative signal in the midportion of the PCL without overt tear.  PATIENT SURVEYS:  LEFS:   COGNITION:  Overall cognitive status: Within functional limits for tasks assessed     MUSCLE LENGTH: Hamstrings: Right 70 deg; Left 70 deg Thomas test: Right 10 deg; Left 10 deg  POSTURE:  mild edema posterior knee; moderate ankle edema  PALPATION: Tender point lateral joint line, distal ITB, distal lateral quads, distal lateral HS  LOWER EXTREMITY ROM:  Active ROM Right eval Left eval  Hip flexion [facility]  Hip extension    Hip abduction    Hip adduction    Hip internal rotation    Hip external rotation    Knee  flexion 150 130  Knee extension 0 0  Ankle dorsiflexion    Ankle plantarflexion    Ankle inversion    Ankle eversion     (Blank rows = not tested)  LOWER EXTREMITY MMT:    MMT Right eval Left eval  Hip flexion 5 5  Hip extension 5 5  Hip abduction 4 4  Hip adduction    Hip internal rotation    Hip external rotation    Knee flexion 5 4  Knee extension 5 4  Ankle dorsiflexion    Ankle plantarflexion    Ankle inversion    Ankle eversion 5 4 pain   (Blank rows = not tested)  LOWER EXTREMITY SPECIAL TESTS:  SLS >10 sec on left but multiple compensations at the ankle, knee, hip very shaky  FUNCTIONAL TESTS:  Avoids squatting (bends at hips to pick up small items)  GAIT:  Comments: WNLs    TODAY'S TREATMENT: 8/16: Standing ITB stretch 3x 20 sec Supine with stretch strap HS and with bias side to side 20 sec 3x each Sidelying clam 10x right/left Sidelying hip abduction 10x right/left  Neuromuscular re-ed: glute medius activation Manual therapy: soft tissue mobilization to left distal ITB, lateral quads and lateral HS Trigger Point Dry-Needling  Treatment instructions: Expect mild to moderate muscle soreness. S/S of pneumothorax if dry needled over a lung field, and to seek immediate medical attention should they occur. Patient verbalized understanding of these instructions and education.  Patient Consent Given: Yes Education handout provided: Yes Muscles treated: distal ITB, lateral quads and lateral HS Electrical stimulation performed: No Parameters: N/A Treatment response/outcome: improved soft tissue mobility      PATIENT EDUCATION:  Education details: initial HEP; dry needling after care Person educated: Patient Education method: Explanation Education comprehension: verbalized understanding   HOME EXERCISE PROGRAM: Access Code: [email or web address] URL: [email or web address] Date: 03/29/2022 Prepared by: Ruben Im  Exercises - Standing ITB  Stretch  - 1 x daily - 7 x weekly - 1 sets - 3 reps - 20-30 hold - Supine Hamstring Stretch with Strap  - 1 x daily - 7 x weekly - 1 sets - 3 reps - 20-30 hold - Supine ITB Stretch with Strap  - 1 x daily - 7 x weekly - 1 sets - 3 reps - 20-30 hold - Clamshell  - 1 x daily - 7 x weekly - 1 sets - 10 reps - Sidelying Hip Abduction  - 1 x daily - 7 x weekly - 1 sets - 10 reps  ASSESSMENT:  CLINICAL IMPRESSION: The patient responds well to initiation of LE stretching and low level glute medius strengthening ex's.  Verbal and tactile cues for positioning to best activate targeted musculature.  Good response to manual therapy and DN to distal lateral thigh muscles.  Therapist monitoring response to all interventions and modifying treatment accordingly.  OBJECTIVE IMPAIRMENTS decreased activity tolerance, decreased mobility, decreased ROM, decreased strength, increased edema, increased fascial restrictions, impaired flexibility, and pain.   ACTIVITY LIMITATIONS standing, squatting, and stairs  PARTICIPATION LIMITATIONS: shopping, community activity, and occupation  PERSONAL FACTORS Time since onset of injury/illness/exacerbation and 1 comorbidity: osteopenia  are also affecting patient's functional outcome.   REHAB POTENTIAL: Good  CLINICAL DECISION MAKING: Stable/uncomplicated  EVALUATION COMPLEXITY: Low   GOALS: Goals reviewed with patient? Yes  SHORT TERM GOALS: Target date: 04/25/2022  The patient will demonstrate knowledge of basic self care strategies and exercises to promote healing   Baseline: Goal status: INITIAL  2.  The patient will report a 40% improvement in left lateral knee pain level with functional activities including going up/down stairs and standing Baseline:  Goal status: INITIAL  3.  Improved knee flexion ROM to 135 degrees needed for greater ease with going up/down stairs Baseline:  Goal status: INITIAL  4.  The patient will have improved hip and knee  strength to at least 4+/5 needed for standing, walking longer distances and descending stairs at home and in the community  Baseline:  Goal status: INITIAL   LONG TERM GOALS: Target date: 05/23/2022   The patient will be independent in a safe self progression of a home exercise program to promote further recovery of function   Baseline:  Goal status: INITIAL  2.  The patient will report a 75% improvement in pain levels with functional activities which are currently difficult including  Baseline:  Goal status: INITIAL  3.  The patient will have improved hip, knee and ankle eversion strength to at least 5-/5 needed for standing, walking longer distances and descending stairs at home and in the community  Baseline:  Goal status: INITIAL  4.  The patient will be able to walk 1 mile with pain level <5/10 Baseline:  Goal status: INITIAL  5.  The patient will have improved LEFS score to   45/80    indicating improved function with less pain  Baseline:  Goal status: INITIAL   PLAN: PT FREQUENCY: 2x/week  PT DURATION: 8 weeks  PLANNED INTERVENTIONS: Therapeutic exercises, Therapeutic activity, Neuromuscular re-education, Patient/Family education, Self Care, Joint mobilization, Aquatic Therapy, Dry Needling, Electrical stimulation, Cryotherapy, Moist heat, Taping, Ultrasound, Ionotophoresis '4mg'$ /ml Dexamethasone, Manual therapy, and Re-evaluation  PLAN FOR NEXT SESSION: ionto if cert signed;assess response to DN to distal ITB, lateral quads, lateral HS, add peroneals and/or ES;  review HEP: HS stretch with strap with bias, standing ITB stretch; sidelying clam, hip abduction; add standing glute medius strengthening (2 inch step downs, SLS  Ruben Im, PT 03/29/22 8:57 AM Phone: [phone] Fax: [phone]

## 2022-04-04 DIAGNOSIS — Z8639 Personal history of other endocrine, nutritional and metabolic disease: Secondary | ICD-10-CM | POA: Diagnosis not present

## 2022-04-04 DIAGNOSIS — K5903 Drug induced constipation: Secondary | ICD-10-CM | POA: Diagnosis not present

## 2022-04-04 DIAGNOSIS — R7303 Prediabetes: Secondary | ICD-10-CM | POA: Diagnosis not present

## 2022-04-04 DIAGNOSIS — Z9189 Other specified personal risk factors, not elsewhere classified: Secondary | ICD-10-CM | POA: Diagnosis not present

## 2022-04-05 ENCOUNTER — Ambulatory Visit: Payer: Medicare Other | Admitting: Physical Therapy

## 2022-04-05 DIAGNOSIS — M6281 Muscle weakness (generalized): Secondary | ICD-10-CM | POA: Diagnosis not present

## 2022-04-05 DIAGNOSIS — G8929 Other chronic pain: Secondary | ICD-10-CM

## 2022-04-05 DIAGNOSIS — M25562 Pain in left knee: Secondary | ICD-10-CM | POA: Diagnosis not present

## 2022-04-05 DIAGNOSIS — R2681 Unsteadiness on feet: Secondary | ICD-10-CM | POA: Diagnosis not present

## 2022-04-05 DIAGNOSIS — M25552 Pain in left hip: Secondary | ICD-10-CM | POA: Diagnosis not present

## 2022-04-05 NOTE — Therapy (Signed)
OUTPATIENT PHYSICAL THERAPY LOWER EXTREMITY PROGRESS NOTE   Patient Name: Megan Greer MRN: [id number] DOB:09-Oct-1955, 66 y.o., female Today's Date: 04/05/2022   PT End of Session - 04/05/22 1101     Visit Number 3    Date for PT Re-Evaluation 05/23/22    Authorization Type U[employer]    PT Start Time 1101    PT Stop Time 1141    PT Time Calculation (min) 40 min    Activity Tolerance Patient tolerated treatment well             Past Medical History:  Diagnosis Date   Anxiety    Diverticulosis    GERD (gastroesophageal reflux disease)    Heart murmur    Hyperlipidemia    Insomnia    Mitral valve regurgitation    mild   Serrated adenoma of colon 2015   Sinusitis    Past Surgical History:  Procedure Laterality Date   BUNIONECTOMY     Right foot    DILATION AND CURETTAGE OF UTERUS     Patient Active Problem List   Diagnosis Date Noted   Abdominal pain, chronic, right lower quadrant 08/13/2013   Unspecified hypothyroidism 05/06/2013   Transaminitis 01/18/2012   Anxiety and depression 11/17/2011   Preventative health care 11/17/2011   Osteopenia 11/17/2011   Chronic insomnia 11/17/2011   Hyperlipidemia 11/17/2011   Gastroenteritis 11/17/2011    PCP: Lajean Manes MD  REFERRING PROVIDER: Rosanne Ashing MD  REFERRING DIAG: iliotibial band syndrome, left leg  THERAPY DIAG:  Hip pain; weakness  Rationale for Evaluation and Treatment Rehabilitation  ONSET DATE: 08/14/21  SUBJECTIVE:   SUBJECTIVE STATEMENT: Busy week with work and hard to get the ex's in.  I'm doing better since the DN.  The strap stretch going in is pretty uncomfortable (adduction).  PERTINENT HISTORY: Mother passed away 3 weeks ago; 1 injection in knee in past no improvement;  had previous PT here for hip  Osteopenia Wants to start [employer] group class 4-5 year history, pop during ex class; another incident of plant/twist crutches 1 day;  saw Dr. Theda Sers at [place]; had surgery  for meniscal debridement.  No relief of pain and thought it was nerve pain.  Had PT 1 year ago but I didn't keep up with the exercises.  Swelling in lower leg/ankle.  Usually wear compression stockings.    PAIN:  Are you having pain? Yes NPRS scale: 2-3/10 Pain location: left lateral knee ; a little swelling on back of knee Aggravating factors: up and down stairs, tried 10 min on new home bike and it aggravated; standing at work; squatting Relieving factors: elevated on wedge; ice  PRECAUTIONS: None  WEIGHT BEARING RESTRICTIONS No  FALLS:  Has patient fallen in last 6 months? No  LIVING ENVIRONMENT: Lives with: lives with their spouse Lives in: House/apartment Stairs: ranch home but some steps to higher room  OCCUPATION: works 2x/week on [employer] all day  PLOF: Independent  PATIENT GOALS  A plan for what I need to do;  not take Mobic all the time;  treat with ex's   OBJECTIVE:   DIAGNOSTIC FINDINGS: MRI 2018  (Had more recently at [facility]) IMPRESSION: 1. Grade 3 oblique tear posterior horn medial meniscus extending to the inferior meniscal surface. 2. 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 This is most severe in the patellofemoral joint. 3. Small knee effusion with small to moderate-sized Baker's cyst and mild semimembranosus-tibial collateral ligament bursitis. 4. Degenerative signal in the midportion  of the PCL without overt tear.  PATIENT SURVEYS:  LEFS:  38/80  COGNITION:  Overall cognitive status: Within functional limits for tasks assessed     MUSCLE LENGTH: Hamstrings: Right 70 deg; Left 70 deg Thomas test: Right 10 deg; Left 10 deg  POSTURE:  mild edema posterior knee; moderate ankle edema  PALPATION: Tender point lateral joint line, distal ITB, distal lateral quads, distal lateral HS  LOWER EXTREMITY ROM:  Active ROM Right eval Left eval  Hip flexion [facility]  Hip extension    Hip abduction    Hip adduction    Hip internal  rotation    Hip external rotation    Knee flexion 150 130  Knee extension 0 0  Ankle dorsiflexion    Ankle plantarflexion    Ankle inversion    Ankle eversion     (Blank rows = not tested)  LOWER EXTREMITY MMT:    MMT Right eval Left eval  Hip flexion 5 5  Hip extension 5 5  Hip abduction 4 4  Hip adduction    Hip internal rotation    Hip external rotation    Knee flexion 5 4  Knee extension 5 4  Ankle dorsiflexion    Ankle plantarflexion    Ankle inversion    Ankle eversion 5 4 pain   (Blank rows = not tested)  LOWER EXTREMITY SPECIAL TESTS:  SLS >10 sec on left but multiple compensations at the ankle, knee, hip very shaky  FUNCTIONAL TESTS:  Avoids squatting (bends at hips to pick up small items)  GAIT:  Comments: WNLs    TODAY'S TREATMENT:  8/23: Sidelying clam 10x right/left; addition of green band with clams 10x right/left Doorway lengthening 5x each side; Wall hip abduction isometric 6-7x right/left  Discussed limiting adduction with strap (light stretch only) Manual therapy: soft tissue mobilization to left distal ITB, lateral quads and lateral HS Trigger Point Dry-Needling  Treatment instructions: Expect mild to moderate muscle soreness. S/S of pneumothorax if dry needled over a lung field, and to seek immediate medical attention should they occur. Patient verbalized understanding of these instructions and education.  Patient Consent Given: Yes Education handout provided: Yes Muscles treated: distal ITB, lateral quads and lateral HS Electrical stimulation performed: No Parameters: N/A Treatment response/outcome: improved soft tissue mobility        8/16: Standing ITB stretch 3x 20 sec Supine with stretch strap HS and with bias side to side 20 sec 3x each Sidelying clam 10x right/left Sidelying hip abduction 10x right/left  Neuromuscular re-ed: glute medius activation Manual therapy: soft tissue mobilization to left distal ITB, lateral  quads and lateral HS Trigger Point Dry-Needling  Treatment instructions: Expect mild to moderate muscle soreness. S/S of pneumothorax if dry needled over a lung field, and to seek immediate medical attention should they occur. Patient verbalized understanding of these instructions and education.  Patient Consent Given: Yes Education handout provided: Yes Muscles treated: distal ITB, lateral quads and lateral HS Electrical stimulation performed: No Parameters: N/A Treatment response/outcome: improved soft tissue mobility      PATIENT EDUCATION:  Education details: initial HEP; dry needling after care Person educated: Patient Education method: Explanation Education comprehension: verbalized understanding   HOME EXERCISE PROGRAM: Access Code: [email or web address] URL: [email or web address] Date: 04/05/2022 Prepared by: Ruben Im  Exercises - Standing ITB Stretch  - 1 x daily - 7 x weekly - 1 sets - 3 reps - 20-30 hold - Supine Hamstring Stretch with Strap  - 1 x  daily - 7 x weekly - 1 sets - 3 reps - 20-30 hold - Supine ITB Stretch with Strap  - 1 x daily - 7 x weekly - 1 sets - 3 reps - 20-30 hold - Clamshell  - 1 x daily - 7 x weekly - 1 sets - 10 reps - Sidelying Hip Abduction  - 1 x daily - 7 x weekly - 1 sets - 10 reps - Standing Quadratus Lumborum Stretch with Doorway  - 1 x daily - 7 x weekly - 1 sets - 5 reps - Standing Isometric Hip Abduction with Knee at 90 at Wall  - 1 x daily - 7 x weekly - 1 sets - 10 reps - Sit to Stand  - 1 x daily - 7 x weekly - 1 sets - 10 reps  ASSESSMENT:  CLINICAL IMPRESSION: Treatment focus on progressive strengthening of glute medius muscle and patellofemoral alignment with sit to stand.  Verbal cues to optimize muscle activation.  Good response with DN and manual therapy to distal lateral knee muscle insertions.        OBJECTIVE IMPAIRMENTS decreased activity tolerance, decreased mobility, decreased ROM, decreased strength,  increased edema, increased fascial restrictions, impaired flexibility, and pain.   ACTIVITY LIMITATIONS standing, squatting, and stairs  PARTICIPATION LIMITATIONS: shopping, community activity, and occupation  PERSONAL FACTORS Time since onset of injury/illness/exacerbation and 1 comorbidity: osteopenia  are also affecting patient's functional outcome.   REHAB POTENTIAL: Good  CLINICAL DECISION MAKING: Stable/uncomplicated  EVALUATION COMPLEXITY: Low   GOALS: Goals reviewed with patient? Yes  SHORT TERM GOALS: Target date: 04/25/2022  The patient will demonstrate knowledge of basic self care strategies and exercises to promote healing   Baseline: Goal status: INITIAL  2.  The patient will report a 40% improvement in left lateral knee pain level with functional activities including going up/down stairs and standing Baseline:  Goal status: INITIAL  3.  Improved knee flexion ROM to 135 degrees needed for greater ease with going up/down stairs Baseline:  Goal status: INITIAL  4.  The patient will have improved hip and knee strength to at least 4+/5 needed for standing, walking longer distances and descending stairs at home and in the community  Baseline:  Goal status: INITIAL   LONG TERM GOALS: Target date: 05/23/2022   The patient will be independent in a safe self progression of a home exercise program to promote further recovery of function   Baseline:  Goal status: INITIAL  2.  The patient will report a 75% improvement in pain levels with functional activities which are currently difficult including  Baseline:  Goal status: INITIAL  3.  The patient will have improved hip, knee and ankle eversion strength to at least 5-/5 needed for standing, walking longer distances and descending stairs at home and in the community  Baseline:  Goal status: INITIAL  4.  The patient will be able to walk 1 mile with pain level <5/10 Baseline:  Goal status: INITIAL  5.  The patient  will have improved LEFS score to   45/80    indicating improved function with less pain  Baseline:  Goal status: INITIAL   PLAN: PT FREQUENCY: 2x/week  PT DURATION: 8 weeks  PLANNED INTERVENTIONS: Therapeutic exercises, Therapeutic activity, Neuromuscular re-education, Patient/Family education, Self Care, Joint mobilization, Aquatic Therapy, Dry Needling, Electrical stimulation, Cryotherapy, Moist heat, Taping, Ultrasound, Ionotophoresis '4mg'$ /ml Dexamethasone, Manual therapy, and Re-evaluation  PLAN FOR NEXT SESSION: review sit to stand to ensure no adduction/valgus; try  2 inch step ups and SLS; try Nu-Step with good patellofemoral alignment (bike bothered in past);  low level leg press;  ionto if cert signed;assess response to DN to distal ITB, lateral quads, lateral HS, add peroneals and/or ES;  review HEP as needed  Ruben Im, PT 04/05/22 11:38 AM Phone: [phone] Fax: [phone]

## 2022-04-07 ENCOUNTER — Ambulatory Visit: Payer: Medicare Other | Admitting: Physical Therapy

## 2022-04-07 DIAGNOSIS — M25562 Pain in left knee: Secondary | ICD-10-CM | POA: Diagnosis not present

## 2022-04-07 DIAGNOSIS — G8929 Other chronic pain: Secondary | ICD-10-CM

## 2022-04-07 DIAGNOSIS — M25552 Pain in left hip: Secondary | ICD-10-CM | POA: Diagnosis not present

## 2022-04-07 DIAGNOSIS — R2681 Unsteadiness on feet: Secondary | ICD-10-CM | POA: Diagnosis not present

## 2022-04-07 DIAGNOSIS — M6281 Muscle weakness (generalized): Secondary | ICD-10-CM | POA: Diagnosis not present

## 2022-04-07 NOTE — Therapy (Signed)
OUTPATIENT PHYSICAL THERAPY LOWER EXTREMITY PROGRESS NOTE   Patient Name: Megan Greer MRN: [id number] DOB:[birth year], 66 y.o., female Today's Date: 04/07/2022   PT End of Session - 04/07/22 0844     Visit Number 4    Date for PT Re-Evaluation 05/23/22    Authorization Type UHC    PT Start Time 0845    PT Stop Time 0925    PT Time Calculation (min) 40 min    Activity Tolerance Patient tolerated treatment well             Past Medical History:  Diagnosis Date   Anxiety    Diverticulosis    GERD (gastroesophageal reflux disease)    Heart murmur    Hyperlipidemia    Insomnia    Mitral valve regurgitation    mild   Serrated adenoma of colon 2015   Sinusitis    Past Surgical History:  Procedure Laterality Date   BUNIONECTOMY     Right foot    DILATION AND CURETTAGE OF UTERUS     Patient Active Problem List   Diagnosis Date Noted   Abdominal pain, chronic, right lower quadrant 08/13/2013   Unspecified hypothyroidism 05/06/2013   Transaminitis 01/18/2012   Anxiety and depression 11/17/2011   Preventative health care 11/17/2011   Osteopenia 11/17/2011   Chronic insomnia 11/17/2011   Hyperlipidemia 11/17/2011   Gastroenteritis 11/17/2011    PCP: Lajean Manes MD  REFERRING PROVIDER: Rosanne Ashing MD  REFERRING DIAG: iliotibial band syndrome, left leg  THERAPY DIAG:  Hip pain; weakness  Rationale for Evaluation and Treatment Rehabilitation  ONSET DATE: 08/14/21  SUBJECTIVE:   SUBJECTIVE STATEMENT: A little sore form the DN.  Work was [employer].    PERTINENT HISTORY: Mother passed away 3 weeks ago; 1 injection in knee in past no improvement;  had previous PT here for hip  Osteopenia Wants to start [employer] group class 4-5 year history, pop during ex class; another incident of plant/twist crutches 1 day;  saw Dr. Theda Sers at [place]; had surgery for meniscal debridement.  No relief of pain and thought it was nerve pain.  Had PT 1 year ago but I  didn't keep up with the exercises.  Swelling in lower leg/ankle.  Usually wear compression stockings.    PAIN:  Are you having pain? Yes NPRS scale: 2-3/10 Pain location: left lateral knee ; a little swelling on back of knee Aggravating factors: up and down stairs, tried 10 min on new home bike and it aggravated; standing at work; squatting Relieving factors: elevated on wedge; ice  PRECAUTIONS: None  WEIGHT BEARING RESTRICTIONS No  FALLS:  Has patient fallen in last 6 months? No  LIVING ENVIRONMENT: Lives with: lives with their spouse Lives in: House/apartment Stairs: ranch home but some steps to higher room  OCCUPATION: works 2x/week on [employer] all day  PLOF: Independent  PATIENT GOALS  A plan for what I need to do;  not take Mobic all the time;  treat with ex's   OBJECTIVE:   DIAGNOSTIC FINDINGS: MRI 2018  (Had more recently at [facility]) IMPRESSION: 1. Grade 3 oblique tear posterior horn medial meniscus extending to the inferior meniscal surface. 2. 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 This is most severe in the patellofemoral joint. 3. Small knee effusion with small to moderate-sized Baker's cyst and mild semimembranosus-tibial collateral ligament bursitis. 4. Degenerative signal in the midportion of the PCL without overt tear.  PATIENT SURVEYS:  LEFS:  38/80  COGNITION:  Overall cognitive status: Within functional limits for tasks assessed     MUSCLE LENGTH: Hamstrings: Right 70 deg; Left 70 deg Thomas test: Right 10 deg; Left 10 deg  POSTURE:  mild edema posterior knee; moderate ankle edema  PALPATION: Tender point lateral joint line, distal ITB, distal lateral quads, distal lateral HS  LOWER EXTREMITY ROM:  Active ROM Right eval Left eval  Hip flexion [facility]  Hip extension    Hip abduction    Hip adduction    Hip internal rotation    Hip external rotation    Knee flexion 150 130  Knee extension 0 0  Ankle dorsiflexion     Ankle plantarflexion    Ankle inversion    Ankle eversion     (Blank rows = not tested)  LOWER EXTREMITY MMT:    MMT Right eval Left eval  Hip flexion 5 5  Hip extension 5 5  Hip abduction 4 4  Hip adduction    Hip internal rotation    Hip external rotation    Knee flexion 5 4  Knee extension 5 4  Ankle dorsiflexion    Ankle plantarflexion    Ankle inversion    Ankle eversion 5 4 pain   (Blank rows = not tested)  LOWER EXTREMITY SPECIAL TESTS:  SLS >10 sec on left but multiple compensations at the ankle, knee, hip very shaky  FUNCTIONAL TESTS:  Avoids squatting (bends at hips to pick up small items)  GAIT:  Comments: WNLs    TODAY'S TREATMENT: 8/25: Nu-Step: L2 6:30 min green band with clams 10x right/left Reverse clam 10x right/left Sit to stand from elevated hi-lo table 8x; with left foot back 8x Wall hip abduction isometric 6-7x right/left  SLS and kickstand with UE green band diagonals 10x right/left  Doorway lengthening 5x each side;  Neuromuscular re-ed: glute medius activation     8/23: Sidelying clam 10x right/left; addition of green band with clams 10x right/left Doorway lengthening 5x each side; Wall hip abduction isometric 6-7x right/left  Discussed limiting adduction with strap (light stretch only) Manual therapy: soft tissue mobilization to left distal ITB, lateral quads and lateral HS Trigger Point Dry-Needling  Treatment instructions: Expect mild to moderate muscle soreness. S/S of pneumothorax if dry needled over a lung field, and to seek immediate medical attention should they occur. Patient verbalized understanding of these instructions and education.  Patient Consent Given: Yes Education handout provided: Yes Muscles treated: distal ITB, lateral quads and lateral HS Electrical stimulation performed: No Parameters: N/A Treatment response/outcome: improved soft tissue mobility      PATIENT EDUCATION:  Education details: initial  HEP; dry needling after care Person educated: Patient Education method: Explanation Education comprehension: verbalized understanding   HOME EXERCISE PROGRAM: Access Code: [email or web address] URL: [email or web address] Date: 04/07/2022 Prepared by: Ruben Im  Exercises - Standing ITB Stretch  - 1 x daily - 7 x weekly - 1 sets - 3 reps - 20-30 hold - Supine Hamstring Stretch with Strap  - 1 x daily - 7 x weekly - 1 sets - 3 reps - 20-30 hold - Supine ITB Stretch with Strap  - 1 x daily - 7 x weekly - 1 sets - 3 reps - 20-30 hold - Clamshell  - 1 x daily - 7 x weekly - 1 sets - 10 reps - Sidelying Hip Abduction  - 1 x daily - 7 x weekly - 1 sets - 10 reps - Standing Quadratus Lumborum Stretch with Doorway  -  1 x daily - 7 x weekly - 1 sets - 5 reps - Standing Isometric Hip Abduction with Knee at 90 at [place]  - 1 x daily - 7 x weekly - 1 sets - 10 reps - Sit to Stand  - 1 x daily - 7 x weekly - 1 sets - 10 reps - Sidelying Reverse Clamshell  - 1 x daily - 7 x weekly - 1 sets - 10 reps - Sideways Step Touch  - 1 x daily - 7 x weekly - 1 sets - 10 reps Access Code: [email or web address] URL: [email or web address] Date: 04/05/2022 Prepared by: Ruben Im  Exercises - Standing ITB Stretch  - 1 x daily - 7 x weekly - 1 sets - 3 reps - 20-30 hold - Supine Hamstring Stretch with Strap  - 1 x daily - 7 x weekly - 1 sets - 3 reps - 20-30 hold - Supine ITB Stretch with Strap  - 1 x daily - 7 x weekly - 1 sets - 3 reps - 20-30 hold - Clamshell  - 1 x daily - 7 x weekly - 1 sets - 10 reps - Sidelying Hip Abduction  - 1 x daily - 7 x weekly - 1 sets - 10 reps - Standing Quadratus Lumborum Stretch with Doorway  - 1 x daily - 7 x weekly - 1 sets - 5 reps - Standing Isometric Hip Abduction with Knee at 90 at Wall  - 1 x daily - 7 x weekly - 1 sets - 10 reps - Sit to Stand  - 1 x daily - 7 x weekly - 1 sets - 10 reps  ASSESSMENT:  CLINICAL IMPRESSION: The patient is able to continue  with a steady progression of glute medius strengthening needed to avoid overuse of iliotibial band.  Notable asymmetries in strength between right and left glute med and quads.  Therapist providing verbal cues for optimum muscle activation.     OBJECTIVE IMPAIRMENTS decreased activity tolerance, decreased mobility, decreased ROM, decreased strength, increased edema, increased fascial restrictions, impaired flexibility, and pain.   ACTIVITY LIMITATIONS standing, squatting, and stairs  PARTICIPATION LIMITATIONS: shopping, community activity, and occupation  PERSONAL FACTORS Time since onset of injury/illness/exacerbation and 1 comorbidity: osteopenia  are also affecting patient's functional outcome.   REHAB POTENTIAL: Good  CLINICAL DECISION MAKING: Stable/uncomplicated  EVALUATION COMPLEXITY: Low   GOALS: Goals reviewed with patient? Yes  SHORT TERM GOALS: Target date: 04/25/2022  The patient will demonstrate knowledge of basic self care strategies and exercises to promote healing   Baseline: Goal status: INITIAL  2.  The patient will report a 40% improvement in left lateral knee pain level with functional activities including going up/down stairs and standing Baseline:  Goal status: INITIAL  3.  Improved knee flexion ROM to 135 degrees needed for greater ease with going up/down stairs Baseline:  Goal status: INITIAL  4.  The patient will have improved hip and knee strength to at least 4+/5 needed for standing, walking longer distances and descending stairs at home and in the community  Baseline:  Goal status: INITIAL   LONG TERM GOALS: Target date: 05/23/2022   The patient will be independent in a safe self progression of a home exercise program to promote further recovery of function   Baseline:  Goal status: INITIAL  2.  The patient will report a 75% improvement in pain levels with functional activities which are currently difficult including  Baseline:  Goal status:  INITIAL  3.  The patient will have improved hip, knee and ankle eversion strength to at least 5-/5 needed for standing, walking longer distances and descending stairs at home and in the community  Baseline:  Goal status: INITIAL  4.  The patient will be able to walk 1 mile with pain level <5/10 Baseline:  Goal status: INITIAL  5.  The patient will have improved LEFS score to   45/80    indicating improved function with less pain  Baseline:  Goal status: INITIAL   PLAN: PT FREQUENCY: 2x/week  PT DURATION: 8 weeks  PLANNED INTERVENTIONS: Therapeutic exercises, Therapeutic activity, Neuromuscular re-education, Patient/Family education, Self Care, Joint mobilization, Aquatic Therapy, Dry Needling, Electrical stimulation, Cryotherapy, Moist heat, Taping, Ultrasound, Ionotophoresis '4mg'$ /ml Dexamethasone, Manual therapy, and Re-evaluation  PLAN FOR NEXT SESSION: 4 inch step ups and SLS;  Nu-Step with good patellofemoral alignment; try  low level leg press;  ionto if cert signed; DN to distal ITB, lateral quads, lateral HS, add peroneals and/or ES;  review HEP as needed  Ruben Im, PT 04/07/22 12:09 PM Phone: [phone] Fax: [phone]

## 2022-04-11 ENCOUNTER — Ambulatory Visit: Payer: Medicare Other

## 2022-04-11 DIAGNOSIS — M25562 Pain in left knee: Secondary | ICD-10-CM | POA: Diagnosis not present

## 2022-04-11 DIAGNOSIS — M6281 Muscle weakness (generalized): Secondary | ICD-10-CM

## 2022-04-11 DIAGNOSIS — G8929 Other chronic pain: Secondary | ICD-10-CM | POA: Diagnosis not present

## 2022-04-11 DIAGNOSIS — R2681 Unsteadiness on feet: Secondary | ICD-10-CM | POA: Diagnosis not present

## 2022-04-11 DIAGNOSIS — M25552 Pain in left hip: Secondary | ICD-10-CM | POA: Diagnosis not present

## 2022-04-11 NOTE — Therapy (Signed)
OUTPATIENT PHYSICAL THERAPY LOWER EXTREMITY PROGRESS NOTE   Patient Name: Megan Greer MRN: [id number] DOB:07-19-1956, 66 y.o., female Today's Date: 04/11/2022   PT End of Session - 04/11/22 0932     Visit Number 5    Date for PT Re-Evaluation 05/23/22    Authorization Type UHC    PT Start Time 5176    PT Stop Time 1607    PT Time Calculation (min) 42 min    Activity Tolerance Patient tolerated treatment well    Behavior During Therapy [facility] for tasks assessed/performed             Past Medical History:  Diagnosis Date   Anxiety    Diverticulosis    GERD (gastroesophageal reflux disease)    Heart murmur    Hyperlipidemia    Insomnia    Mitral valve regurgitation    mild   Serrated adenoma of colon 2015   Sinusitis    Past Surgical History:  Procedure Laterality Date   BUNIONECTOMY     Right foot    DILATION AND CURETTAGE OF UTERUS     Patient Active Problem List   Diagnosis Date Noted   Abdominal pain, chronic, right lower quadrant 08/13/2013   Unspecified hypothyroidism 05/06/2013   Transaminitis 01/18/2012   Anxiety and depression 11/17/2011   Preventative health care 11/17/2011   Osteopenia 11/17/2011   Chronic insomnia 11/17/2011   Hyperlipidemia 11/17/2011   Gastroenteritis 11/17/2011    PCP: Lajean Manes MD  REFERRING PROVIDER: Rosanne Ashing MD  REFERRING DIAG: iliotibial band syndrome, left leg  THERAPY DIAG:  Hip pain; weakness  Rationale for Evaluation and Treatment Rehabilitation  ONSET DATE: 08/14/21  SUBJECTIVE:   SUBJECTIVE STATEMENT: Work was a little hectic last week so I'm a little sore from that.  Overall about 50% better.      PERTINENT HISTORY: Mother passed away 3 weeks ago; 1 injection in knee in past no improvement;  had previous PT here for hip  Osteopenia Wants to start [employer] group class 4-5 year history, pop during ex class; another incident of plant/twist crutches 1 day;  saw Dr. Theda Sers at [place]; had surgery for meniscal debridement.  No relief of pain and thought it was nerve pain.  Had PT 1 year ago but I didn't keep up with the exercises.  Swelling in lower leg/ankle.  Usually wear compression stockings.    PAIN:  Are you having pain? Yes NPRS scale: 2-3/10 Pain location: left lateral knee ; a little swelling on back of knee Aggravating factors: up and down stairs, tried 10 min on new home bike and it aggravated; standing at work; squatting Relieving factors: elevated on wedge; ice  PRECAUTIONS: None  WEIGHT BEARING RESTRICTIONS No  FALLS:  Has patient fallen in last 6 months? No  LIVING ENVIRONMENT: Lives with: lives with their spouse Lives in: House/apartment Stairs: ranch home but some steps to higher room  OCCUPATION: works 2x/week on [employer] all day  PLOF: Independent  PATIENT GOALS  A plan for what I need to do;  not take Mobic all the time;  treat with ex's   OBJECTIVE:   DIAGNOSTIC FINDINGS: MRI 2018  (Had more recently at [facility]) IMPRESSION: 1. Grade 3 oblique tear posterior horn medial meniscus extending to the inferior meniscal surface. 2. 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 This is most severe in the patellofemoral joint. 3. Small knee effusion with small to moderate-sized Baker's cyst and mild semimembranosus-tibial collateral ligament bursitis. 4.  Degenerative signal in the midportion of the PCL without overt tear.  PATIENT SURVEYS:  LEFS:  38/80  COGNITION:  Overall cognitive status: Within functional limits for tasks assessed     MUSCLE LENGTH: Hamstrings: Right 70 deg; Left 70 deg Thomas test: Right 10 deg; Left 10 deg  POSTURE:  mild edema posterior knee; moderate ankle edema  PALPATION: Tender point lateral joint line, distal ITB, distal lateral quads, distal lateral HS  LOWER EXTREMITY ROM:  Active ROM Right eval Left eval  Hip flexion [facility]  Hip extension    Hip abduction    Hip adduction     Hip internal rotation    Hip external rotation    Knee flexion 150 130  Knee extension 0 0  Ankle dorsiflexion    Ankle plantarflexion    Ankle inversion    Ankle eversion     (Blank rows = not tested)  LOWER EXTREMITY MMT:    MMT Right eval Left eval  Hip flexion 5 5  Hip extension 5 5  Hip abduction 4 4  Hip adduction    Hip internal rotation    Hip external rotation    Knee flexion 5 4  Knee extension 5 4  Ankle dorsiflexion    Ankle plantarflexion    Ankle inversion    Ankle eversion 5 4 pain   (Blank rows = not tested)  LOWER EXTREMITY SPECIAL TESTS:  SLS >10 sec on left but multiple compensations at the ankle, knee, hip very shaky  FUNCTIONAL TESTS:  Avoids squatting (bends at hips to pick up small items)  GAIT:  Comments: WNLs    TODAY'S TREATMENT: 8/29: Nu-Step: L2 6:30 min Standing hamstring stretch 3 x 30 sec both Standing hip flexor/quad stretch 3 x 30 sec both Supine IT band stretch 3 x 30 sec left Hooklying clam with blue loop 2 x 10 Sidelying clam with blue loop 2 x 10 Seated reverse clam with blue loop 2 x 10 Sit to stand from mat table 2 x 10 Cupping to distal IT band x 8 min to mobilize soft tissue and reduce adhesions.  8/25: Nu-Step: L2 6:30 min green band with clams 10x right/left Reverse clam 10x right/left Sit to stand from elevated hi-lo table 8x; with left foot back 8x Wall hip abduction isometric 6-7x right/left  SLS and kickstand with UE green band diagonals 10x right/left  Doorway lengthening 5x each side; Neuromuscular re-ed: glute medius activation  8/23: Sidelying clam 10x right/left; addition of green band with clams 10x right/left Doorway lengthening 5x each side; Wall hip abduction isometric 6-7x right/left  Discussed limiting adduction with strap (light stretch only) Manual therapy: soft tissue mobilization to left distal ITB, lateral quads and lateral HS Trigger Point Dry-Needling  Treatment instructions:  Expect mild to moderate muscle soreness. S/S of pneumothorax if dry needled over a lung field, and to seek immediate medical attention should they occur. Patient verbalized understanding of these instructions and education. Patient Consent Given: Yes Education handout provided: Yes Muscles treated: distal ITB, lateral quads and lateral HS Electrical stimulation performed: No Parameters: N/A Treatment response/outcome: improved soft tissue mobility      PATIENT EDUCATION:  Education details: initial HEP; dry needling after care Person educated: Patient Education method: Explanation Education comprehension: verbalized understanding   HOME EXERCISE PROGRAM: Access Code: [email or web address] URL: [email or web address] Date: 04/07/2022 Prepared by: Ruben Im  Exercises - Standing ITB Stretch  - 1 x daily - 7 x weekly - 1 sets - 3  reps - 20-30 hold - Supine Hamstring Stretch with Strap  - 1 x daily - 7 x weekly - 1 sets - 3 reps - 20-30 hold - Supine ITB Stretch with Strap  - 1 x daily - 7 x weekly - 1 sets - 3 reps - 20-30 hold - Clamshell  - 1 x daily - 7 x weekly - 1 sets - 10 reps - Sidelying Hip Abduction  - 1 x daily - 7 x weekly - 1 sets - 10 reps - Standing Quadratus Lumborum Stretch with Doorway  - 1 x daily - 7 x weekly - 1 sets - 5 reps - Standing Isometric Hip Abduction with Knee at 90 at Wall  - 1 x daily - 7 x weekly - 1 sets - 10 reps - Sit to Stand  - 1 x daily - 7 x weekly - 1 sets - 10 reps - Sidelying Reverse Clamshell  - 1 x daily - 7 x weekly - 1 sets - 10 reps - Sideways Step Touch  - 1 x daily - 7 x weekly - 1 sets - 10 reps Access Code: [email or web address] URL: [email or web address] Date: 04/05/2022 Prepared by: Ruben Im  Exercises - Standing ITB Stretch  - 1 x daily - 7 x weekly - 1 sets - 3 reps - 20-30 hold - Supine Hamstring Stretch with Strap  - 1 x daily - 7 x weekly - 1 sets - 3 reps - 20-30 hold - Supine ITB Stretch with Strap  - 1 x  daily - 7 x weekly - 1 sets - 3 reps - 20-30 hold - Clamshell  - 1 x daily - 7 x weekly - 1 sets - 10 reps - Sidelying Hip Abduction  - 1 x daily - 7 x weekly - 1 sets - 10 reps - Standing Quadratus Lumborum Stretch with Doorway  - 1 x daily - 7 x weekly - 1 sets - 5 reps - Standing Isometric Hip Abduction with Knee at 90 at Wall  - 1 x daily - 7 x weekly - 1 sets - 10 reps - Sit to Stand  - 1 x daily - 7 x weekly - 1 sets - 10 reps  ASSESSMENT:  CLINICAL IMPRESSION: Megan Greer is progressing appropriately.  She continues to have some lateral knee pain discomfort but has improved subjectively 50%.  She had some notable tightness in right LE that was quite asymmetrical vs. Left LE.  She responded well to cupping at distal IT band .  She may respond well to addition of ice at home.  She was instructed to add in ice to help with soreness following a long work day or post exercise.  She would benefit from continued skilled PT for IT flexibility and STM along with hip stability.     OBJECTIVE IMPAIRMENTS decreased activity tolerance, decreased mobility, decreased ROM, decreased strength, increased edema, increased fascial restrictions, impaired flexibility, and pain.   ACTIVITY LIMITATIONS standing, squatting, and stairs  PARTICIPATION LIMITATIONS: shopping, community activity, and occupation  PERSONAL FACTORS Time since onset of injury/illness/exacerbation and 1 comorbidity: osteopenia  are also affecting patient's functional outcome.   REHAB POTENTIAL: Good  CLINICAL DECISION MAKING: Stable/uncomplicated  EVALUATION COMPLEXITY: Low   GOALS: Goals reviewed with patient? Yes  SHORT TERM GOALS: Target date: 04/25/2022  The patient will demonstrate knowledge of basic self care strategies and exercises to promote healing   Baseline: Goal status: INITIAL  2.  The patient will report a 40% improvement in  left lateral knee pain level with functional activities including going up/down stairs and  standing Baseline:  Goal status: INITIAL  3.  Improved knee flexion ROM to 135 degrees needed for greater ease with going up/down stairs Baseline:  Goal status: INITIAL  4.  The patient will have improved hip and knee strength to at least 4+/5 needed for standing, walking longer distances and descending stairs at home and in the community  Baseline:  Goal status: INITIAL   LONG TERM GOALS: Target date: 05/23/2022   The patient will be independent in a safe self progression of a home exercise program to promote further recovery of function   Baseline:  Goal status: INITIAL  2.  The patient will report a 75% improvement in pain levels with functional activities which are currently difficult including  Baseline:  Goal status: INITIAL  3.  The patient will have improved hip, knee and ankle eversion strength to at least 5-/5 needed for standing, walking longer distances and descending stairs at home and in the community  Baseline:  Goal status: INITIAL  4.  The patient will be able to walk 1 mile with pain level <5/10 Baseline:  Goal status: INITIAL  5.  The patient will have improved LEFS score to   45/80    indicating improved function with less pain  Baseline:  Goal status: INITIAL   PLAN: PT FREQUENCY: 2x/week  PT DURATION: 8 weeks  PLANNED INTERVENTIONS: Therapeutic exercises, Therapeutic activity, Neuromuscular re-education, Patient/Family education, Self Care, Joint mobilization, Aquatic Therapy, Dry Needling, Electrical stimulation, Cryotherapy, Moist heat, Taping, Ultrasound, Ionotophoresis '4mg'$ /ml Dexamethasone, Manual therapy, and Re-evaluation  PLAN FOR NEXT SESSION:  assess response to cupping,  4 inch step ups and SLS;  Nu-Step with good patellofemoral alignment; try  low level leg press;  ionto if cert signed; DN to distal ITB, lateral quads, lateral HS, add peroneals and/or ES;  review HEP as needed  Emory Gallentine B. Lorilynn Lehr, PT 04/11/22 11:00 AM  Phone:  [phone] Fax: [phone]

## 2022-04-13 ENCOUNTER — Other Ambulatory Visit: Payer: Self-pay | Admitting: Internal Medicine

## 2022-04-13 DIAGNOSIS — D485 Neoplasm of uncertain behavior of skin: Secondary | ICD-10-CM | POA: Diagnosis not present

## 2022-04-13 DIAGNOSIS — L821 Other seborrheic keratosis: Secondary | ICD-10-CM | POA: Diagnosis not present

## 2022-04-13 DIAGNOSIS — D2262 Melanocytic nevi of left upper limb, including shoulder: Secondary | ICD-10-CM | POA: Diagnosis not present

## 2022-04-13 DIAGNOSIS — Z1231 Encounter for screening mammogram for malignant neoplasm of breast: Secondary | ICD-10-CM

## 2022-04-18 ENCOUNTER — Ambulatory Visit: Payer: Medicare Other

## 2022-04-19 ENCOUNTER — Ambulatory Visit: Payer: Medicare Other | Attending: Adult Reconstructive Orthopaedic Surgery | Admitting: Physical Therapy

## 2022-04-19 DIAGNOSIS — G8929 Other chronic pain: Secondary | ICD-10-CM | POA: Diagnosis not present

## 2022-04-19 DIAGNOSIS — M6281 Muscle weakness (generalized): Secondary | ICD-10-CM | POA: Diagnosis not present

## 2022-04-19 DIAGNOSIS — M25552 Pain in left hip: Secondary | ICD-10-CM | POA: Insufficient documentation

## 2022-04-19 DIAGNOSIS — M25562 Pain in left knee: Secondary | ICD-10-CM | POA: Diagnosis not present

## 2022-04-19 NOTE — Therapy (Signed)
OUTPATIENT PHYSICAL THERAPY LOWER EXTREMITY PROGRESS NOTE   Patient Name: Megan Greer MRN: [id number] DOB:03/19/1956, 66 y.o., female Today's Date: 04/19/2022   PT End of Session - 04/19/22 1230     Visit Number 6    Date for PT Re-Evaluation 05/23/22    Authorization Type UHC    PT Start Time 1230    PT Stop Time 1314    PT Time Calculation (min) 44 min    Activity Tolerance Patient tolerated treatment well             Past Medical History:  Diagnosis Date   Anxiety    Diverticulosis    GERD (gastroesophageal reflux disease)    Heart murmur    Hyperlipidemia    Insomnia    Mitral valve regurgitation    mild   Serrated adenoma of colon 2015   Sinusitis    Past Surgical History:  Procedure Laterality Date   BUNIONECTOMY     Right foot    DILATION AND CURETTAGE OF UTERUS     Patient Active Problem List   Diagnosis Date Noted   Abdominal pain, chronic, right lower quadrant 08/13/2013   Unspecified hypothyroidism 05/06/2013   Transaminitis 01/18/2012   Anxiety and depression 11/17/2011   Preventative health care 11/17/2011   Osteopenia 11/17/2011   Chronic insomnia 11/17/2011   Hyperlipidemia 11/17/2011   Gastroenteritis 11/17/2011    PCP: Lajean Manes MD  REFERRING PROVIDER: Rosanne Ashing MD  REFERRING DIAG: iliotibial band syndrome, left leg  THERAPY DIAG:  Hip pain; weakness  Rationale for Evaluation and Treatment Rehabilitation  ONSET DATE: 08/14/21  SUBJECTIVE:   SUBJECTIVE STATEMENT: Had a stomach virus.  My knee has been hurting more lately.  May be from extra amount of walking of the dog.    PERTINENT HISTORY: Mother passed away 3 weeks ago; 1 injection in knee in past no improvement;  had previous PT here for hip  Osteopenia Wants to start [employer] group class 4-5 year history, pop during ex class; another incident of plant/twist crutches 1 day;  saw Dr. Theda Sers at [place]; had surgery for meniscal debridement.  No  relief of pain and thought it was nerve pain.  Had PT 1 year ago but I didn't keep up with the exercises.  Swelling in lower leg/ankle.  Usually wear compression stockings.    PAIN:  Are you having pain? Yes NPRS scale: 2-3/10 Pain location: left lateral knee ; a little swelling on back of knee Aggravating factors: up and down stairs, tried 10 min on new home bike and it aggravated; standing at work; squatting Relieving factors: elevated on wedge; ice  PRECAUTIONS: None  WEIGHT BEARING RESTRICTIONS No  FALLS:  Has patient fallen in last 6 months? No  LIVING ENVIRONMENT: Lives with: lives with their spouse Lives in: House/apartment Stairs: ranch home but some steps to higher room  OCCUPATION: works 2x/week on [employer] all day  PLOF: Independent  PATIENT GOALS  A plan for what I need to do;  not take Mobic all the time;  treat with ex's   OBJECTIVE:   DIAGNOSTIC FINDINGS: MRI 2018  (Had more recently at [facility]) IMPRESSION: 1. Grade 3 oblique tear posterior horn medial meniscus extending to the inferior meniscal surface. 2. 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 This is most severe in the patellofemoral joint. 3. Small knee effusion with small to moderate-sized Baker's cyst and mild semimembranosus-tibial collateral ligament bursitis. 4. Degenerative signal in the midportion of the PCL  without overt tear.  PATIENT SURVEYS:  LEFS:  38/80  COGNITION:  Overall cognitive status: Within functional limits for tasks assessed     MUSCLE LENGTH: Hamstrings: Right 70 deg; Left 70 deg Thomas test: Right 10 deg; Left 10 deg  POSTURE:  mild edema posterior knee; moderate ankle edema  PALPATION: Tender point lateral joint line, distal ITB, distal lateral quads, distal lateral HS  LOWER EXTREMITY ROM:  Active ROM Right eval Left eval  Hip flexion [facility]  Hip extension    Hip abduction    Hip adduction    Hip internal rotation    Hip external rotation     Knee flexion 150 130  Knee extension 0 0  Ankle dorsiflexion    Ankle plantarflexion    Ankle inversion    Ankle eversion     (Blank rows = not tested)  LOWER EXTREMITY MMT:    MMT Right eval Left eval  Hip flexion 5 5  Hip extension 5 5  Hip abduction 4 4  Hip adduction    Hip internal rotation    Hip external rotation    Knee flexion 5 4  Knee extension 5 4  Ankle dorsiflexion    Ankle plantarflexion    Ankle inversion    Ankle eversion 5 4 pain   (Blank rows = not tested)  LOWER EXTREMITY SPECIAL TESTS:  SLS >10 sec on left but multiple compensations at the ankle, knee, hip very shaky  FUNCTIONAL TESTS:  Avoids squatting (bends at hips to pick up small items)  GAIT:  Comments: WNLs    TODAY'S TREATMENT:  9/6: Nu-Step: L3 8 min Discussed concepts of time needed for healing Right heel on step with RDL WB on left 5# kettlebell Doorway lengthening 5x each side; SLS on left with bicep curls and overhead press 5x each Leg press seat 7 bil 70# 15x; 35# left only 15x 2 inch step downs heel touch 10x Neuromuscular re-ed: glute medius activation Manual therapy: soft tissue mobilization to left distal ITB, lateral quads and lateral HS Trigger Point Dry-Needling  Treatment instructions: Expect mild to moderate muscle soreness. S/S of pneumothorax if dry needled over a lung field, and to seek immediate medical attention should they occur. Patient verbalized understanding of these instructions and education. Patient Consent Given: Yes Education handout provided: Yes Muscles treated: distal ITB, lateral quads and lateral HS Electrical stimulation performed: No Parameters: N/A Treatment response/outcome: improved soft tissue mobility         8/29: Nu-Step: L2 6:30 min Standing hamstring stretch 3 x 30 sec both Standing hip flexor/quad stretch 3 x 30 sec both Supine IT band stretch 3 x 30 sec left Hooklying clam with blue loop 2 x 10 Sidelying clam with  blue loop 2 x 10 Seated reverse clam with blue loop 2 x 10 Sit to stand from mat table 2 x 10 Cupping to distal IT band x 8 min to mobilize soft tissue and reduce adhesions.  8/25: Nu-Step: L2 6:30 min green band with clams 10x right/left Reverse clam 10x right/left Sit to stand from elevated hi-lo table 8x; with left foot back 8x Wall hip abduction isometric 6-7x right/left  SLS and kickstand with UE green band diagonals 10x right/left  Doorway lengthening 5x each side; Neuromuscular re-ed: glute medius activation    PATIENT EDUCATION:  Education details: initial HEP; dry needling after care Person educated: Patient Education method: Explanation Education comprehension: verbalized understanding   HOME EXERCISE PROGRAM: Access Code: [email or web address] URL:  [email or web address] Date: 04/07/2022 Prepared by: Ruben Im  Exercises - Standing ITB Stretch  - 1 x daily - 7 x weekly - 1 sets - 3 reps - 20-30 hold - Supine Hamstring Stretch with Strap  - 1 x daily - 7 x weekly - 1 sets - 3 reps - 20-30 hold - Supine ITB Stretch with Strap  - 1 x daily - 7 x weekly - 1 sets - 3 reps - 20-30 hold - Clamshell  - 1 x daily - 7 x weekly - 1 sets - 10 reps - Sidelying Hip Abduction  - 1 x daily - 7 x weekly - 1 sets - 10 reps - Standing Quadratus Lumborum Stretch with Doorway  - 1 x daily - 7 x weekly - 1 sets - 5 reps - Standing Isometric Hip Abduction with Knee at 90 at Wall  - 1 x daily - 7 x weekly - 1 sets - 10 reps - Sit to Stand  - 1 x daily - 7 x weekly - 1 sets - 10 reps - Sidelying Reverse Clamshell  - 1 x daily - 7 x weekly - 1 sets - 10 reps - Sideways Step Touch  - 1 x daily - 7 x weekly - 1 sets - 10 reps Access Code: [email or web address] URL: [email or web address] Date: 04/05/2022 Prepared by: Ruben Im  Exercises - Standing ITB Stretch  - 1 x daily - 7 x weekly - 1 sets - 3 reps - 20-30 hold - Supine Hamstring Stretch with Strap  - 1 x daily - 7 x  weekly - 1 sets - 3 reps - 20-30 hold - Supine ITB Stretch with Strap  - 1 x daily - 7 x weekly - 1 sets - 3 reps - 20-30 hold - Clamshell  - 1 x daily - 7 x weekly - 1 sets - 10 reps - Sidelying Hip Abduction  - 1 x daily - 7 x weekly - 1 sets - 10 reps - Standing Quadratus Lumborum Stretch with Doorway  - 1 x daily - 7 x weekly - 1 sets - 5 reps - Standing Isometric Hip Abduction with Knee at 90 at Wall  - 1 x daily - 7 x weekly - 1 sets - 10 reps - Sit to Stand  - 1 x daily - 7 x weekly - 1 sets - 10 reps  ASSESSMENT:  CLINICAL IMPRESSION: Treatment focus on glute medius and quad strengthening to minimize overuse of ITB and lateral soft tissue.  Verbal cues for patellofemoral alignment to optimize ex benefit.  On track with anticipated recovery.      OBJECTIVE IMPAIRMENTS decreased activity tolerance, decreased mobility, decreased ROM, decreased strength, increased edema, increased fascial restrictions, impaired flexibility, and pain.   ACTIVITY LIMITATIONS standing, squatting, and stairs  PARTICIPATION LIMITATIONS: shopping, community activity, and occupation  PERSONAL FACTORS Time since onset of injury/illness/exacerbation and 1 comorbidity: osteopenia  are also affecting patient's functional outcome.   REHAB POTENTIAL: Good  CLINICAL DECISION MAKING: Stable/uncomplicated  EVALUATION COMPLEXITY: Low   GOALS: Goals reviewed with patient? Yes  SHORT TERM GOALS: Target date: 04/25/2022  The patient will demonstrate knowledge of basic self care strategies and exercises to promote healing   Baseline: Goal status: INITIAL  2.  The patient will report a 40% improvement in left lateral knee pain level with functional activities including going up/down stairs and standing Baseline:  Goal status: INITIAL  3.  Improved knee flexion ROM to 135 degrees needed for greater ease  with going up/down stairs Baseline:  Goal status: INITIAL  4.  The patient will have improved hip and  knee strength to at least 4+/5 needed for standing, walking longer distances and descending stairs at home and in the community  Baseline:  Goal status: INITIAL   LONG TERM GOALS: Target date: 05/23/2022   The patient will be independent in a safe self progression of a home exercise program to promote further recovery of function   Baseline:  Goal status: INITIAL  2.  The patient will report a 75% improvement in pain levels with functional activities which are currently difficult including  Baseline:  Goal status: INITIAL  3.  The patient will have improved hip, knee and ankle eversion strength to at least 5-/5 needed for standing, walking longer distances and descending stairs at home and in the community  Baseline:  Goal status: INITIAL  4.  The patient will be able to walk 1 mile with pain level <5/10 Baseline:  Goal status: INITIAL  5.  The patient will have improved LEFS score to   45/80    indicating improved function with less pain  Baseline:  Goal status: INITIAL   PLAN: PT FREQUENCY: 2x/week  PT DURATION: 8 weeks  PLANNED INTERVENTIONS: Therapeutic exercises, Therapeutic activity, Neuromuscular re-education, Patient/Family education, Self Care, Joint mobilization, Aquatic Therapy, Dry Needling, Electrical stimulation, Cryotherapy, Moist heat, Taping, Ultrasound, Ionotophoresis '4mg'$ /ml Dexamethasone, Manual therapy, and Re-evaluation  PLAN FOR NEXT SESSION:  check STGS;  4 inch step ups and SLS;  Nu-Step with good patellofemoral alignment;  low level leg press;  ionto if cert signed; DN to distal ITB, lateral quads, lateral HS, add peroneals and/or ES;  review HEP as needed  Ruben Im, PT 04/19/22 1:18 PM Phone: [phone] Fax: [phone]

## 2022-04-24 ENCOUNTER — Other Ambulatory Visit (HOSPITAL_COMMUNITY): Payer: Self-pay

## 2022-04-25 ENCOUNTER — Ambulatory Visit: Payer: Medicare Other | Admitting: Physical Therapy

## 2022-04-25 DIAGNOSIS — M25552 Pain in left hip: Secondary | ICD-10-CM | POA: Diagnosis not present

## 2022-04-25 DIAGNOSIS — M6281 Muscle weakness (generalized): Secondary | ICD-10-CM

## 2022-04-25 DIAGNOSIS — M25562 Pain in left knee: Secondary | ICD-10-CM | POA: Diagnosis not present

## 2022-04-25 DIAGNOSIS — G8929 Other chronic pain: Secondary | ICD-10-CM

## 2022-04-25 NOTE — Therapy (Signed)
OUTPATIENT PHYSICAL THERAPY LOWER EXTREMITY PROGRESS NOTE   Patient Name: Megan Greer MRN: [id number] DOB:1955/11/22, 66 y.o., female Today's Date: 04/25/2022   PT End of Session - 04/25/22 1100     Visit Number 7    Date for PT Re-Evaluation 05/23/22    Authorization Type [employer]    PT Start Time 1100    PT Stop Time 1140    PT Time Calculation (min) 40 min    Activity Tolerance Patient tolerated treatment well             Past Medical History:  Diagnosis Date   Anxiety    Diverticulosis    GERD (gastroesophageal reflux disease)    Heart murmur    Hyperlipidemia    Insomnia    Mitral valve regurgitation    mild   Serrated adenoma of colon 2015   Sinusitis    Past Surgical History:  Procedure Laterality Date   BUNIONECTOMY     Right foot    DILATION AND CURETTAGE OF UTERUS     Patient Active Problem List   Diagnosis Date Noted   Abdominal pain, chronic, right lower quadrant 08/13/2013   Unspecified hypothyroidism 05/06/2013   Transaminitis 01/18/2012   Anxiety and depression 11/17/2011   Preventative health care 11/17/2011   Osteopenia 11/17/2011   Chronic insomnia 11/17/2011   Hyperlipidemia 11/17/2011   Gastroenteritis 11/17/2011    PCP: Lajean Manes MD  REFERRING PROVIDER: Rosanne Ashing MD  REFERRING DIAG: iliotibial band syndrome, left leg  THERAPY DIAG:  Hip pain; weakness  Rationale for Evaluation and Treatment Rehabilitation  ONSET DATE: 08/14/21  SUBJECTIVE:   SUBJECTIVE STATEMENT: Yesterday the (lateral) lower leg really bothered me.  It's the most it's hurt in a long time.    PERTINENT HISTORY: Mother passed away 3 weeks ago; 1 injection in knee in past no improvement;  had previous PT here for hip  Osteopenia Wants to start [employer] group class 4-5 year history, pop during ex class; another incident of plant/twist crutches 1 day;  saw Dr. Theda Sers at [place]; had surgery for meniscal debridement.  No relief of pain and  thought it was nerve pain.  Had PT 1 year ago but I didn't keep up with the exercises.  Swelling in lower leg/ankle.  Usually wear compression stockings.    PAIN:  Are you having pain? Yes NPRS scale: 2-3/10 Pain location: left lateral knee ; a little swelling on back of knee Aggravating factors: up and down stairs, tried 10 min on new home bike and it aggravated; standing at work; squatting Relieving factors: elevated on wedge; ice  PRECAUTIONS: None  WEIGHT BEARING RESTRICTIONS No  FALLS:  Has patient fallen in last 6 months? No  LIVING ENVIRONMENT: Lives with: lives with their spouse Lives in: House/apartment Stairs: ranch home but some steps to higher room  OCCUPATION: works 2x/week on [employer] all day  PLOF: Independent  PATIENT GOALS  A plan for what I need to do;  not take Mobic all the time;  treat with ex's   OBJECTIVE:   DIAGNOSTIC FINDINGS: MRI 2018  (Had more recently at [facility]) IMPRESSION: 1. Grade 3 oblique tear posterior horn medial meniscus extending to the inferior meniscal surface. 2. 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 This is most severe in the patellofemoral joint. 3. Small knee effusion with small to moderate-sized Baker's cyst and mild semimembranosus-tibial collateral ligament bursitis. 4. Degenerative signal in the midportion of the PCL without overt tear.  PATIENT  SURVEYS:  LEFS:  38/80  COGNITION:  Overall cognitive status: Within functional limits for tasks assessed     MUSCLE LENGTH: Hamstrings: Right 70 deg; Left 70 deg Thomas test: Right 10 deg; Left 10 deg  POSTURE:  mild edema posterior knee; moderate ankle edema  PALPATION: Tender point lateral joint line, distal ITB, distal lateral quads, distal lateral HS  LOWER EXTREMITY ROM:  Active ROM Right eval Left eval 9/12  Hip flexion [facility]   Hip extension     Hip abduction     Hip adduction     Hip internal rotation     Hip external rotation     Knee  flexion 150 130 135  Knee extension 0 0 0  Ankle dorsiflexion     Ankle plantarflexion     Ankle inversion     Ankle eversion      (Blank rows = not tested)  LOWER EXTREMITY MMT:    MMT Right eval Left eval Left  9/12  Hip flexion $RemoveBef'5 5 5  'OHWthvVKKM$ Hip extension $RemoveBefor'5 5 5  'cuqapGYCxANY$ Hip abduction $RemoveBefor'4 4 4  'intOroDpqIeE$ Hip adduction     Hip internal rotation     Hip external rotation     Knee flexion $RemoveBefo'5 4 4  'ZhWgqmdMoin$ Knee extension $RemoveBefore'5 4 4  'ZmAkvKPLBvtRm$ Ankle dorsiflexion     Ankle plantarflexion   4  Ankle inversion     Ankle eversion 5 4 pain 4   (Blank rows = not tested)  LOWER EXTREMITY SPECIAL TESTS:  SLS >10 sec on left but multiple compensations at the ankle, knee, hip very shaky  FUNCTIONAL TESTS:  Avoids squatting (bends at hips to pick up small items)  GAIT:  Comments: WNLs    TODAY'S TREATMENT: 9/12: Nu-Step: L3 6 min Discussed peroneal compensation/overuse Heel raises with attention to straight feet 2x10 Arch lifts in standing 2x5 Calf stretch against wall 2 x30 sec Wall hip abduction isometric with small dip 10x right/left  Leg press seat 7 bil 75# 15x; 35# left only 15x 2 inch step downs heel touch 10x 2inch lateral 3 way step downs 10x some lateral leg pain; Neuromuscular re-ed: glute medius activation; decreased use of peroneal tendons Therapeutic activities:  sit to stand, standing, walking, negotiating curbs, steps,      9/6: Nu-Step: L3 8 min Discussed concepts of time needed for healing Right heel on step with RDL WB on left 5# kettlebell Doorway lengthening 5x each side; SLS on left with bicep curls and overhead press 5x each Leg press seat 7 bil 70# 15x; 35# left only 15x 2 inch step downs heel touch 10x Neuromuscular re-ed: glute medius activation Manual therapy: soft tissue mobilization to left distal ITB, lateral quads and lateral HS Trigger Point Dry-Needling  Treatment instructions: Expect mild to moderate muscle soreness. S/S of pneumothorax if dry needled over a lung field, and to seek  immediate medical attention should they occur. Patient verbalized understanding of these instructions and education. Patient Consent Given: Yes Education handout provided: Yes Muscles treated: distal ITB, lateral quads and lateral HS Electrical stimulation performed: No Parameters: N/A Treatment response/outcome: improved soft tissue mobility        PATIENT EDUCATION:  Education details: initial HEP; dry needling after care Person educated: Patient Education method: Explanation Education comprehension: verbalized understanding   HOME EXERCISE PROGRAM: Access Code: [email or web address] URL: [email or web address] Date: 04/07/2022 Prepared by: Ruben Im Access Code: [email or web address] URL: [email or web address] Date: 04/25/2022 Prepared by: Ruben Im  Exercises - Standing ITB Stretch  -  1 x daily - 7 x weekly - 1 sets - 3 reps - 20-30 hold - Supine Hamstring Stretch with Strap  - 1 x daily - 7 x weekly - 1 sets - 3 reps - 20-30 hold - Supine ITB Stretch with Strap  - 1 x daily - 7 x weekly - 1 sets - 3 reps - 20-30 hold - Clamshell  - 1 x daily - 7 x weekly - 1 sets - 10 reps - Sidelying Hip Abduction  - 1 x daily - 7 x weekly - 1 sets - 10 reps - Standing Quadratus Lumborum Stretch with Doorway  - 1 x daily - 7 x weekly - 1 sets - 5 reps - Standing Isometric Hip Abduction with Knee at 90 at Wall  - 1 x daily - 7 x weekly - 1 sets - 10 reps - Sit to Stand  - 1 x daily - 7 x weekly - 1 sets - 10 reps - Sidelying Reverse Clamshell  - 1 x daily - 7 x weekly - 1 sets - 10 reps - Sideways Step Touch  - 1 x daily - 7 x weekly - 1 sets - 10 reps - Gastroc Stretch on Wall  - 1 x daily - 7 x weekly - 1 sets - 3 reps - 30 hold - Heel Raises with Counter Support  - 1 x daily - 7 x weekly - 1 sets - 10 reps - Arch Lifting  - 1 x daily - 7 x weekly - 1 sets - 10 reps  ASSESSMENT:  CLINICAL IMPRESSION: Signs and symptoms consistent with overuse of peroneal tendons.   Discussed strategies to decrease irritation including ice massage, intrinsic foot strengthening, functional activity modifications and the need for further strengthening of glutes and quads to avoid overuse of ITB and peroneals.  Verbal and tactile cues for foot and patellofemoral alignment.  Partial STGs met.     OBJECTIVE IMPAIRMENTS decreased activity tolerance, decreased mobility, decreased ROM, decreased strength, increased edema, increased fascial restrictions, impaired flexibility, and pain.   ACTIVITY LIMITATIONS standing, squatting, and stairs  PARTICIPATION LIMITATIONS: shopping, community activity, and occupation  PERSONAL FACTORS Time since onset of injury/illness/exacerbation and 1 comorbidity: osteopenia  are also affecting patient's functional outcome.   REHAB POTENTIAL: Good  CLINICAL DECISION MAKING: Stable/uncomplicated  EVALUATION COMPLEXITY: Low   GOALS: Goals reviewed with patient? Yes  SHORT TERM GOALS: Target date: 04/25/2022  The patient will demonstrate knowledge of basic self care strategies and exercises to promote healing   Baseline: Goal status: goal met 9/12  2.  The patient will report a 40% improvement in left lateral knee pain level with functional activities including going up/down stairs and standing Baseline:  Goal status: ongoing  3.  Improved knee flexion ROM to 135 degrees needed for greater ease with going up/down stairs Baseline:  Goal status: goal met 9/12  4.  The patient will have improved hip and knee strength to at least 4+/5 needed for standing, walking longer distances and descending stairs at home and in the community  Baseline:  Goal status: ongoing   LONG TERM GOALS: Target date: 05/23/2022   The patient will be independent in a safe self progression of a home exercise program to promote further recovery of function   Baseline:  Goal status: INITIAL  2.  The patient will report a 75% improvement in pain levels with  functional activities which are currently difficult including  Baseline:  Goal status: INITIAL  3.  The patient will have improved hip, knee and ankle eversion strength to at least 5-/5 needed for standing, walking longer distances and descending stairs at home and in the community  Baseline:  Goal status: INITIAL  4.  The patient will be able to walk 1 mile with pain level <5/10 Baseline:  Goal status: INITIAL  5.  The patient will have improved LEFS score to   45/80    indicating improved function with less pain  Baseline:  Goal status: INITIAL   PLAN: PT FREQUENCY: 2x/week  PT DURATION: 8 weeks  PLANNED INTERVENTIONS: Therapeutic exercises, Therapeutic activity, Neuromuscular re-education, Patient/Family education, Self Care, Joint mobilization, Aquatic Therapy, Dry Needling, Electrical stimulation, Cryotherapy, Moist heat, Taping, Ultrasound, Ionotophoresis 4mg /ml Dexamethasone, Manual therapy, and Re-evaluation  PLAN FOR NEXT SESSION:    4 inch step ups and SLS;  Nu-Step with good patellofemoral alignment;  low level leg press;  ionto if cert signed; DN to distal ITB, lateral quads, lateral HS, add peroneals and/or ES;  review HEP as needed  Ruben Im, PT 04/25/22 2:34 PM Phone: [phone] Fax: [phone]

## 2022-04-26 DIAGNOSIS — I872 Venous insufficiency (chronic) (peripheral): Secondary | ICD-10-CM | POA: Diagnosis not present

## 2022-04-26 DIAGNOSIS — R600 Localized edema: Secondary | ICD-10-CM | POA: Diagnosis not present

## 2022-04-27 ENCOUNTER — Ambulatory Visit: Payer: Medicare Other | Admitting: Physical Therapy

## 2022-05-02 ENCOUNTER — Other Ambulatory Visit (HOSPITAL_COMMUNITY): Payer: Self-pay

## 2022-05-02 ENCOUNTER — Ambulatory Visit: Payer: Medicare Other | Admitting: Physical Therapy

## 2022-05-02 DIAGNOSIS — R7303 Prediabetes: Secondary | ICD-10-CM | POA: Diagnosis not present

## 2022-05-02 DIAGNOSIS — K5903 Drug induced constipation: Secondary | ICD-10-CM | POA: Diagnosis not present

## 2022-05-02 MED ORDER — OZEMPIC (0.25 OR 0.5 MG/DOSE) 2 MG/3ML ~~LOC~~ SOPN
PEN_INJECTOR | SUBCUTANEOUS | 1 refills | Status: DC
  Filled 2022-05-02 – 2022-05-17 (×2): qty 3, 28d supply, fill #0

## 2022-05-05 ENCOUNTER — Ambulatory Visit: Payer: Medicare Other | Admitting: Physical Therapy

## 2022-05-09 ENCOUNTER — Ambulatory Visit: Payer: Medicare Other | Admitting: Physical Therapy

## 2022-05-09 DIAGNOSIS — M25562 Pain in left knee: Secondary | ICD-10-CM | POA: Diagnosis not present

## 2022-05-09 DIAGNOSIS — M25552 Pain in left hip: Secondary | ICD-10-CM | POA: Diagnosis not present

## 2022-05-09 DIAGNOSIS — G8929 Other chronic pain: Secondary | ICD-10-CM | POA: Diagnosis not present

## 2022-05-09 DIAGNOSIS — M6281 Muscle weakness (generalized): Secondary | ICD-10-CM

## 2022-05-09 NOTE — Therapy (Signed)
OUTPATIENT PHYSICAL THERAPY LOWER EXTREMITY PROGRESS NOTE   Patient Name: Megan Greer MRN: [id number] DOB:1956/01/09, 66 y.o., female Today's Date: 05/09/2022   PT End of Session - 05/09/22 1103     Visit Number 8    Date for PT Re-Evaluation 05/23/22    Authorization Type UHC    PT Start Time 1102    PT Stop Time 1140    PT Time Calculation (min) 38 min    Activity Tolerance Patient tolerated treatment well             Past Medical History:  Diagnosis Date   Anxiety    Diverticulosis    GERD (gastroesophageal reflux disease)    Heart murmur    Hyperlipidemia    Insomnia    Mitral valve regurgitation    mild   Serrated adenoma of colon 2015   Sinusitis    Past Surgical History:  Procedure Laterality Date   BUNIONECTOMY     Right foot    DILATION AND CURETTAGE OF UTERUS     Patient Active Problem List   Diagnosis Date Noted   Abdominal pain, chronic, right lower quadrant 08/13/2013   Unspecified hypothyroidism 05/06/2013   Transaminitis 01/18/2012   Anxiety and depression 11/17/2011   Preventative health care 11/17/2011   Osteopenia 11/17/2011   Chronic insomnia 11/17/2011   Hyperlipidemia 11/17/2011   Gastroenteritis 11/17/2011    PCP: Lajean Manes MD  REFERRING PROVIDER: Rosanne Ashing MD  REFERRING DIAG: iliotibial band syndrome, left leg  THERAPY DIAG:  Hip pain; weakness  Rationale for Evaluation and Treatment Rehabilitation  ONSET DATE: 08/14/21  SUBJECTIVE:   SUBJECTIVE STATEMENT: Comes in cycles.  Carried seasonal clothes up and down the steps.  I think the 2nd episode of DN irritated it a bit.  I think there is a movement at work that irritates.    PERTINENT HISTORY: Mother passed away 3 weeks ago; 1 injection in knee in past no improvement;  had previous PT here for hip  Osteopenia Wants to start [employer] group class 4-5 year history, pop during ex class; another incident of plant/twist crutches 1 day;  saw Dr. Theda Sers  at [place]; had surgery for meniscal debridement.  No relief of pain and thought it was nerve pain.  Had PT 1 year ago but I didn't keep up with the exercises.  Swelling in lower leg/ankle.  Usually wear compression stockings.    PAIN:  Are you having pain? Yes NPRS scale: 1/10 Pain location: left lateral knee feel it when I first wake up  Aggravating factors: up and down stairs, tried 10 min on new home bike and it aggravated; standing at work; squatting Relieving factors: elevated on wedge; ice  PRECAUTIONS: None  WEIGHT BEARING RESTRICTIONS No  FALLS:  Has patient fallen in last 6 months? No  LIVING ENVIRONMENT: Lives with: lives with their spouse Lives in: House/apartment Stairs: ranch home but some steps to higher room  OCCUPATION: works 2x/week on [employer] all day  PLOF: Independent  PATIENT GOALS  A plan for what I need to do;  not take Mobic all the time;  treat with ex's   OBJECTIVE:   DIAGNOSTIC FINDINGS: MRI 2018  (Had more recently at [facility]) IMPRESSION: 1. Grade 3 oblique tear posterior horn medial meniscus extending to the inferior meniscal surface. 2. 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 This is most severe in the patellofemoral joint. 3. Small knee effusion with small to moderate-sized Baker's cyst and mild semimembranosus-tibial  collateral ligament bursitis. 4. Degenerative signal in the midportion of the PCL without overt tear.  PATIENT SURVEYS:  LEFS:  38/80  COGNITION:  Overall cognitive status: Within functional limits for tasks assessed     MUSCLE LENGTH: Hamstrings: Right 70 deg; Left 70 deg Thomas test: Right 10 deg; Left 10 deg  POSTURE:  mild edema posterior knee; moderate ankle edema  PALPATION: Tender point lateral joint line, distal ITB, distal lateral quads, distal lateral HS  LOWER EXTREMITY ROM:  Active ROM Right eval Left eval 9/12  Hip flexion [facility]   Hip extension     Hip abduction     Hip  adduction     Hip internal rotation     Hip external rotation     Knee flexion 150 130 135  Knee extension 0 0 0  Ankle dorsiflexion     Ankle plantarflexion     Ankle inversion     Ankle eversion      (Blank rows = not tested)  LOWER EXTREMITY MMT:    MMT Right eval Left eval Left  9/12  Hip flexion $RemoveBef'5 5 5  'aMxHLbVPJD$ Hip extension $RemoveBefor'5 5 5  'WuqiNRnVSktr$ Hip abduction $RemoveBefor'4 4 4  'BAewJTacvQOO$ Hip adduction     Hip internal rotation     Hip external rotation     Knee flexion $RemoveBefo'5 4 4  'YfzTEwedZUH$ Knee extension $RemoveBefore'5 4 4  'lXSQHEBNFfDZS$ Ankle dorsiflexion     Ankle plantarflexion   4  Ankle inversion     Ankle eversion 5 4 pain 4   (Blank rows = not tested)  LOWER EXTREMITY SPECIAL TESTS:  SLS >10 sec on left but multiple compensations at the ankle, knee, hip very shaky  FUNCTIONAL TESTS:  Avoids squatting (bends at hips to pick up small items)  GAIT:  Comments: WNLs    TODAY'S TREATMENT:  9/26: Nu-Step: L1 8 min Clams left with feet on wall 10x2 Standing hip left/right extension 10x2 Sidelying left hip abduction 10x2 Leg press seat 7 bil 75# 15x; 35# left only 15x Floor slider hip abduction 2x10 right/left  Side step at the bar with green band around thighs 10x Neuromuscular re-ed: glute medius activation; decreased use of peroneal tendons Therapeutic activities:  sit to stand, standing, walking, negotiating curbs, steps,        9/12: Nu-Step: L3 6 min Discussed peroneal compensation/overuse Heel raises with attention to straight feet 2x10 Arch lifts in standing 2x5 Calf stretch against wall 2 x30 sec Wall hip abduction isometric with small dip 10x right/left  Leg press seat 7 bil 75# 15x; 35# left only 15x 2 inch step downs heel touch 10x 2inch lateral 3 way step downs 10x some lateral leg pain; Neuromuscular re-ed: glute medius activation; decreased use of peroneal tendons Therapeutic activities:  sit to stand, standing, walking, negotiating curbs, steps,      9/6: Nu-Step: L3 8 min Discussed concepts of time needed  for healing Right heel on step with RDL WB on left 5# kettlebell Doorway lengthening 5x each side; SLS on left with bicep curls and overhead press 5x each Leg press seat 7 bil 70# 15x; 35# left only 15x 2 inch step downs heel touch 10x Neuromuscular re-ed: glute medius activation Manual therapy: soft tissue mobilization to left distal ITB, lateral quads and lateral HS Trigger Point Dry-Needling  Treatment instructions: Expect mild to moderate muscle soreness. S/S of pneumothorax if dry needled over a lung field, and to seek immediate medical attention should they occur. Patient verbalized understanding  of these instructions and education. Patient Consent Given: Yes Education handout provided: Yes Muscles treated: distal ITB, lateral quads and lateral HS Electrical stimulation performed: No Parameters: N/A Treatment response/outcome: improved soft tissue mobility        PATIENT EDUCATION:  Education details: initial HEP; dry needling after care Person educated: Patient Education method: Explanation Education comprehension: verbalized understanding   HOME EXERCISE PROGRAM: Access Code: [email or web address] URL: [email or web address] Date: 05/09/2022 Prepared by: Ruben Im  Exercises - Standing ITB Stretch  - 1 x daily - 7 x weekly - 1 sets - 3 reps - 20-30 hold - Supine Hamstring Stretch with Strap  - 1 x daily - 7 x weekly - 1 sets - 3 reps - 20-30 hold - Supine ITB Stretch with Strap  - 1 x daily - 7 x weekly - 1 sets - 3 reps - 20-30 hold - Clamshell  - 1 x daily - 7 x weekly - 1 sets - 10 reps - Sidelying Hip Abduction  - 1 x daily - 7 x weekly - 1 sets - 10 reps - Standing Quadratus Lumborum Stretch with Doorway  - 1 x daily - 7 x weekly - 1 sets - 5 reps - Standing Isometric Hip Abduction with Knee at 90 at Wall  - 1 x daily - 7 x weekly - 1 sets - 10 reps - Sit to Stand  - 1 x daily - 7 x weekly - 1 sets - 10 reps - Sidelying Reverse Clamshell  - 1 x daily - 7 x  weekly - 1 sets - 10 reps - Sideways Step Touch  - 1 x daily - 7 x weekly - 1 sets - 10 reps - Gastroc Stretch on Wall  - 1 x daily - 7 x weekly - 1 sets - 3 reps - 30 hold - Heel Raises with Counter Support  - 1 x daily - 7 x weekly - 2 sets - 10 reps - Arch Lifting  - 1 x daily - 7 x weekly - 1 sets - 10 reps - Side Stepping with Resistance at Thighs  - 1 x daily - 7 x weekly - 1 sets - 5 reps  ASSESSMENT:  CLINICAL IMPRESSION: Good response to hip abduction sidelying against the wall which limits compensatory strategies.  Verbal and tactile cues for patellofemoral alignment and to decrease compensatory strategies including knee valgus during functional movements.   Therapist monitoring response.  Pain remains at a very low intensity.      OBJECTIVE IMPAIRMENTS decreased activity tolerance, decreased mobility, decreased ROM, decreased strength, increased edema, increased fascial restrictions, impaired flexibility, and pain.   ACTIVITY LIMITATIONS standing, squatting, and stairs  PARTICIPATION LIMITATIONS: shopping, community activity, and occupation  PERSONAL FACTORS Time since onset of injury/illness/exacerbation and 1 comorbidity: osteopenia  are also affecting patient's functional outcome.   REHAB POTENTIAL: Good  CLINICAL DECISION MAKING: Stable/uncomplicated  EVALUATION COMPLEXITY: Low   GOALS: Goals reviewed with patient? Yes  SHORT TERM GOALS: Target date: 04/25/2022  The patient will demonstrate knowledge of basic self care strategies and exercises to promote healing   Baseline: Goal status: goal met 9/12  2.  The patient will report a 40% improvement in left lateral knee pain level with functional activities including going up/down stairs and standing Baseline:  Goal status: ongoing  3.  Improved knee flexion ROM to 135 degrees needed for greater ease with going up/down stairs Baseline:  Goal status: goal met 9/12  4.  The patient will  have improved hip and knee  strength to at least 4+/5 needed for standing, walking longer distances and descending stairs at home and in the community  Baseline:  Goal status: ongoing   LONG TERM GOALS: Target date: 05/23/2022   The patient will be independent in a safe self progression of a home exercise program to promote further recovery of function   Baseline:  Goal status: INITIAL  2.  The patient will report a 75% improvement in pain levels with functional activities which are currently difficult including  Baseline:  Goal status: INITIAL  3.  The patient will have improved hip, knee and ankle eversion strength to at least 5-/5 needed for standing, walking longer distances and descending stairs at home and in the community  Baseline:  Goal status: INITIAL  4.  The patient will be able to walk 1 mile with pain level <5/10 Baseline:  Goal status: INITIAL  5.  The patient will have improved LEFS score to   45/80    indicating improved function with less pain  Baseline:  Goal status: INITIAL   PLAN: PT FREQUENCY: 2x/week  PT DURATION: 8 weeks  PLANNED INTERVENTIONS: Therapeutic exercises, Therapeutic activity, Neuromuscular re-education, Patient/Family education, Self Care, Joint mobilization, Aquatic Therapy, Dry Needling, Electrical stimulation, Cryotherapy, Moist heat, Taping, Ultrasound, Ionotophoresis 4mg /ml Dexamethasone, Manual therapy, and Re-evaluation  PLAN FOR NEXT SESSION:    4 inch step ups and SLS;  Nu-Step with good patellofemoral alignment;  low level leg press;  ionto if cert signed; DN to distal ITB, lateral quads, lateral HS,  review HEP as needed Ruben Im, PT 05/09/22 11:49 AM Phone: [phone] Fax: [phone]

## 2022-05-11 ENCOUNTER — Ambulatory Visit: Payer: Medicare Other | Admitting: Physical Therapy

## 2022-05-15 ENCOUNTER — Other Ambulatory Visit (HOSPITAL_COMMUNITY): Payer: Self-pay

## 2022-05-16 ENCOUNTER — Ambulatory Visit: Payer: Medicare Other | Admitting: Physical Therapy

## 2022-05-17 ENCOUNTER — Other Ambulatory Visit (HOSPITAL_COMMUNITY): Payer: Self-pay

## 2022-05-19 ENCOUNTER — Ambulatory Visit: Payer: Medicare Other | Attending: Adult Reconstructive Orthopaedic Surgery | Admitting: Physical Therapy

## 2022-05-19 DIAGNOSIS — M25562 Pain in left knee: Secondary | ICD-10-CM | POA: Insufficient documentation

## 2022-05-19 DIAGNOSIS — M6281 Muscle weakness (generalized): Secondary | ICD-10-CM | POA: Insufficient documentation

## 2022-05-19 DIAGNOSIS — M25552 Pain in left hip: Secondary | ICD-10-CM | POA: Diagnosis not present

## 2022-05-19 DIAGNOSIS — G8929 Other chronic pain: Secondary | ICD-10-CM | POA: Insufficient documentation

## 2022-05-19 NOTE — Therapy (Signed)
OUTPATIENT PHYSICAL THERAPY LOWER EXTREMITY PROGRESS [place] SUMMARY    Patient Name: Megan Greer MRN: [id number] DOB:06/07/1956, 66 y.o., female Today's Date: 05/19/2022   PT End of Session - 05/19/22 1027     Visit Number 9    Date for PT Re-Evaluation 05/23/22    Authorization Type U[employer]    PT Start Time 1016    PT Stop Time 1055    PT Time Calculation (min) 39 min    Activity Tolerance Patient tolerated treatment well             Past Medical History:  Diagnosis Date   Anxiety    Diverticulosis    GERD (gastroesophageal reflux disease)    Heart murmur    Hyperlipidemia    Insomnia    Mitral valve regurgitation    mild   Serrated adenoma of colon 2015   Sinusitis    Past Surgical History:  Procedure Laterality Date   BUNIONECTOMY     Right foot    DILATION AND CURETTAGE OF UTERUS     Patient Active Problem List   Diagnosis Date Noted   Abdominal pain, chronic, right lower quadrant 08/13/2013   Unspecified hypothyroidism 05/06/2013   Transaminitis 01/18/2012   Anxiety and depression 11/17/2011   Preventative health care 11/17/2011   Osteopenia 11/17/2011   Chronic insomnia 11/17/2011   Hyperlipidemia 11/17/2011   Gastroenteritis 11/17/2011    PCP: Lajean Manes MD  REFERRING PROVIDER: Rosanne Ashing MD  REFERRING DIAG: iliotibial band syndrome, left leg  THERAPY DIAG:  Hip pain; weakness  Rationale for Evaluation and Treatment Rehabilitation  ONSET DATE: 08/14/21  SUBJECTIVE:   SUBJECTIVE STATEMENT: I think I'm better but it still hurts some when I'm standing long hours at work.  Rates overall progress at 75%.  Able to walk the dog around several [place] and it doesn't seem to bother it.  PERTINENT HISTORY: Mother passed away 3 weeks ago; 1 injection in knee in past no improvement;  had previous PT here for hip  Osteopenia Wants to start [employer] group class 4-5 year history, pop during ex class; another incident of  plant/twist crutches 1 day;  saw Dr. Theda Sers at [place]; had surgery for meniscal debridement.  No relief of pain and thought it was nerve pain.  Had PT 1 year ago but I didn't keep up with the exercises.  Swelling in lower leg/ankle.  Usually wear compression stockings.    PAIN:  Are you having pain? Yes NPRS scale: 1/10 Pain location: left lateral knee feel it when I first wake up  Aggravating factors: up and down stairs, tried 10 min on new home bike and it aggravated; standing at work; squatting Relieving factors: elevated on wedge; ice  PRECAUTIONS: None  WEIGHT BEARING RESTRICTIONS No  FALLS:  Has patient fallen in last 6 months? No  LIVING ENVIRONMENT: Lives with: lives with their spouse Lives in: House/apartment Stairs: ranch home but some steps to higher room  OCCUPATION: works 2x/week on [employer] all day  PLOF: Independent  PATIENT GOALS  A plan for what I need to do;  not take Mobic all the time;  treat with ex's   OBJECTIVE:   DIAGNOSTIC FINDINGS: MRI 2018  (Had more recently at [facility]) IMPRESSION: 1. Grade 3 oblique tear posterior horn medial meniscus extending to the inferior meniscal surface. 2. 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 This is most severe in the patellofemoral joint. 3. Small knee effusion with small to moderate-sized Baker's cyst  and mild semimembranosus-tibial collateral ligament bursitis. 4. Degenerative signal in the midportion of the PCL without overt tear.  PATIENT SURVEYS:  LEFS:  38/80   10/6:   55/80  COGNITION:  Overall cognitive status: Within functional limits for tasks assessed     MUSCLE LENGTH: Hamstrings: Right 70 deg; Left 70 deg Thomas test: Right 10 deg; Left 10 deg  POSTURE:  mild edema posterior knee; moderate ankle edema  PALPATION: Tender point lateral joint line, distal ITB, distal lateral quads, distal lateral HS  LOWER EXTREMITY ROM:  Active ROM Right eval Left eval 9/12 10/6   Hip flexion [facility]    Hip extension      Hip abduction      Hip adduction      Hip internal rotation      Hip external rotation      Knee flexion 150 130 135 145  Knee extension 0 0 0 0  Ankle dorsiflexion      Ankle plantarflexion      Ankle inversion      Ankle eversion       (Blank rows = not tested)  LOWER EXTREMITY MMT:    MMT Right eval Left eval Left  9/12 10/6  Hip flexion $RemoveBef'5 5 5   'rrlntcRYmI$ Hip extension $RemoveBefor'5 5 5   'HzeMfkWsimRA$ Hip abduction $RemoveBefor'4 4 4 'zmzxlKEMhgle$ 4+  Hip adduction      Hip internal rotation      Hip external rotation      Knee flexion $RemoveBefo'5 4 4 'CEPckOvJfeX$ 4+  Knee extension $RemoveBefore'5 4 4 'EpNWZueaQeISJ$ 4+  Ankle dorsiflexion      Ankle plantarflexion   4 4+  Ankle inversion      Ankle eversion 5 4 pain 4 4+   (Blank rows = not tested)  LOWER EXTREMITY SPECIAL TESTS:  SLS >10 sec on left but multiple compensations at the ankle, knee, hip very shaky  FUNCTIONAL TESTS:  Avoids squatting (bends at hips to pick up small items)  GAIT:  Comments: WNLs    TODAY'S TREATMENT: 10/6: Nu-Step: L1 8 min Clams left with feet on wall 10x2 Sidelying left hip abduction 10x2 ROM, LEFS, MMT Comprehensive review of HEP and discussion of focus areas for further improvement  Neuromuscular re-ed: glute medius activation; decreased use of peroneal tendons     9/26: Nu-Step: L1 8 min Clams left with feet on wall 10x2 Standing hip left/right extension 10x2 Sidelying left hip abduction 10x2 Leg press seat 7 bil 75# 15x; 35# left only 15x Floor slider hip abduction 2x10 right/left  Side step at the bar with green band around thighs 10x Neuromuscular re-ed: glute medius activation; decreased use of peroneal tendons Therapeutic activities:  sit to stand, standing, walking, negotiating curbs, steps,      PATIENT EDUCATION:  Education details: initial HEP; dry needling after care Person educated: Patient Education method: Explanation Education comprehension: verbalized understanding   HOME EXERCISE PROGRAM: Access Code:  [email or web address] URL: [email or web address] Date: 05/09/2022 Prepared by: Ruben Im  Exercises - Standing ITB Stretch  - 1 x daily - 7 x weekly - 1 sets - 3 reps - 20-30 hold - Supine Hamstring Stretch with Strap  - 1 x daily - 7 x weekly - 1 sets - 3 reps - 20-30 hold - Supine ITB Stretch with Strap  - 1 x daily - 7 x weekly - 1 sets - 3 reps - 20-30 hold - Clamshell  - 1 x daily - 7 x weekly - 1 sets -  10 reps - Sidelying Hip Abduction  - 1 x daily - 7 x weekly - 1 sets - 10 reps - Standing Quadratus Lumborum Stretch with Doorway  - 1 x daily - 7 x weekly - 1 sets - 5 reps - Standing Isometric Hip Abduction with Knee at 90 at Wall  - 1 x daily - 7 x weekly - 1 sets - 10 reps - Sit to Stand  - 1 x daily - 7 x weekly - 1 sets - 10 reps - Sidelying Reverse Clamshell  - 1 x daily - 7 x weekly - 1 sets - 10 reps - Sideways Step Touch  - 1 x daily - 7 x weekly - 1 sets - 10 reps - Gastroc Stretch on Wall  - 1 x daily - 7 x weekly - 1 sets - 3 reps - 30 hold - Heel Raises with Counter Support  - 1 x daily - 7 x weekly - 2 sets - 10 reps - Arch Lifting  - 1 x daily - 7 x weekly - 1 sets - 10 reps - Side Stepping with Resistance at Thighs  - 1 x daily - 7 x weekly - 1 sets - 5 reps  ASSESSMENT:  CLINICAL IMPRESSION: The patient has met the majority of rehab goals, with noted improvements in pain reduction, outcome score, ROM, strength and functional mobility.  A comprehensive HEP has been established and anticipate further improvements over time with regular performance of the program.  Recommend discharge from PT at this time.   OBJECTIVE IMPAIRMENTS decreased activity tolerance, decreased mobility, decreased ROM, decreased strength, increased edema, increased fascial restrictions, impaired flexibility, and pain.   ACTIVITY LIMITATIONS standing, squatting, and stairs  PARTICIPATION LIMITATIONS: shopping, community activity, and occupation  PERSONAL FACTORS Time since onset of  injury/illness/exacerbation and 1 comorbidity: osteopenia  are also affecting patient's functional outcome.   REHAB POTENTIAL: Good  CLINICAL DECISION MAKING: Stable/uncomplicated  EVALUATION COMPLEXITY: Low   GOALS: Goals reviewed with patient? Yes  SHORT TERM GOALS: Target date: 04/25/2022  The patient will demonstrate knowledge of basic self care strategies and exercises to promote healing   Baseline: Goal status: goal met 9/12  2.  The patient will report a 40% improvement in left lateral knee pain level with functional activities including going up/down stairs and standing Baseline:  Goal status: ongoing  3.  Improved knee flexion ROM to 135 degrees needed for greater ease with going up/down stairs Baseline:  Goal status: goal met 9/12  4.  The patient will have improved hip and knee strength to at least 4+/5 needed for standing, walking longer distances and descending stairs at home and in the community  Baseline:  Goal status: ongoing   LONG TERM GOALS: Target date: 05/23/2022   The patient will be independent in a safe self progression of a home exercise program to promote further recovery of function   Baseline:  Goal status: goal met  2.  The patient will report a 75% improvement in pain levels with functional activities which are currently difficult including  Baseline:  Goal status: goal met 10/6  3.  The patient will have improved hip, knee and ankle eversion strength to at least 5-/5 needed for standing, walking longer distances and descending stairs at home and in the community  Baseline:  Goal status: partially met  4.  The patient will be able to walk 1 mile with pain level <5/10 Baseline:  Goal status: goal met   5.  The patient will have improved LEFS score to   45/80    indicating improved function with less pain  Baseline:  Goal status: goal met    PLAN: PT FREQUENCY: 2x/week  PT DURATION: 8 weeks  PLANNED INTERVENTIONS: Therapeutic  exercises, Therapeutic activity, Neuromuscular re-education, Patient/Family education, Self Care, Joint mobilization, Aquatic Therapy, Dry Needling, Electrical stimulation, Cryotherapy, Moist heat, Taping, Ultrasound, Ionotophoresis 4mg /ml Dexamethasone, Manual therapy, and Re-evaluation  PLAN FOR NEXT SESSION:    4 inch step ups and SLS;  Nu-Step with good patellofemoral alignment;  low level leg press;  ionto if cert signed; DN to distal ITB, lateral quads, lateral HS,  review HEP as needed   PHYSICAL THERAPY DISCHARGE SUMMARY  Visits from Start of Care: 9  Current functional level related to goals / functional outcomes: See clinical impressions above   Remaining deficits: As above   Education / Equipment: Comprehensive HEP   Patient agrees to discharge. Patient goals were met. Patient is being discharged due to meeting the stated rehab goals.  Ruben Im, PT 05/19/22 1:33 PM Phone: [phone] Fax: [phone]

## 2022-05-30 DIAGNOSIS — R7303 Prediabetes: Secondary | ICD-10-CM | POA: Diagnosis not present

## 2022-05-30 DIAGNOSIS — K5903 Drug induced constipation: Secondary | ICD-10-CM | POA: Diagnosis not present

## 2022-05-31 ENCOUNTER — Other Ambulatory Visit (HOSPITAL_COMMUNITY): Payer: Self-pay

## 2022-05-31 MED ORDER — OZEMPIC (0.25 OR 0.5 MG/DOSE) 2 MG/3ML ~~LOC~~ SOPN
0.5000 mg | PEN_INJECTOR | SUBCUTANEOUS | 1 refills | Status: AC
Start: 2022-05-30 — End: ?
  Filled 2022-05-31 – 2022-06-07 (×2): qty 3, 28d supply, fill #0

## 2022-06-06 ENCOUNTER — Ambulatory Visit
Admission: RE | Admit: 2022-06-06 | Discharge: 2022-06-06 | Disposition: A | Discharge status: Home / Self Care | Payer: Medicare Other | Source: Ambulatory Visit | Attending: Internal Medicine | Admitting: Internal Medicine

## 2022-06-06 DIAGNOSIS — Z1231 Encounter for screening mammogram for malignant neoplasm of breast: Secondary | ICD-10-CM | POA: Diagnosis not present

## 2022-06-07 ENCOUNTER — Other Ambulatory Visit (HOSPITAL_COMMUNITY): Payer: Self-pay

## 2022-06-08 ENCOUNTER — Other Ambulatory Visit (HOSPITAL_COMMUNITY): Payer: Self-pay

## 2022-06-08 MED ORDER — PAXLOVID (300/100) 20 X 150 MG & 10 X 100MG PO TBPK
ORAL_TABLET | ORAL | 0 refills | Status: AC
Start: 2022-06-08 — End: ?
  Filled 2022-06-08: qty 30, 5d supply, fill #0

## 2022-06-13 ENCOUNTER — Other Ambulatory Visit: Payer: Self-pay | Admitting: Gastroenterology

## 2022-06-13 ENCOUNTER — Other Ambulatory Visit (HOSPITAL_COMMUNITY): Payer: Self-pay

## 2022-06-13 MED ORDER — FAMOTIDINE 40 MG PO TABS
40.0000 mg | ORAL_TABLET | Freq: Every day | ORAL | 0 refills | Status: DC
  Filled 2022-06-13: qty 30, 30d supply, fill #0

## 2022-06-27 DIAGNOSIS — M7989 Other specified soft tissue disorders: Secondary | ICD-10-CM | POA: Diagnosis not present

## 2022-06-27 DIAGNOSIS — R600 Localized edema: Secondary | ICD-10-CM | POA: Diagnosis not present

## 2022-06-27 DIAGNOSIS — I83813 Varicose veins of bilateral lower extremities with pain: Secondary | ICD-10-CM | POA: Diagnosis not present

## 2022-06-27 DIAGNOSIS — I872 Venous insufficiency (chronic) (peripheral): Secondary | ICD-10-CM | POA: Diagnosis not present

## 2022-06-29 ENCOUNTER — Other Ambulatory Visit (HOSPITAL_COMMUNITY): Payer: Self-pay

## 2022-06-29 DIAGNOSIS — R7303 Prediabetes: Secondary | ICD-10-CM | POA: Diagnosis not present

## 2022-06-29 DIAGNOSIS — K5903 Drug induced constipation: Secondary | ICD-10-CM | POA: Diagnosis not present

## 2022-06-29 MED ORDER — LOMAIRA 8 MG PO TABS
8.0000 mg | ORAL_TABLET | Freq: Every morning | ORAL | 1 refills | Status: DC
  Filled 2022-06-29: qty 30, 30d supply, fill #0

## 2022-06-30 ENCOUNTER — Other Ambulatory Visit (HOSPITAL_COMMUNITY): Payer: Self-pay

## 2022-07-04 ENCOUNTER — Other Ambulatory Visit (HOSPITAL_COMMUNITY): Payer: Self-pay

## 2022-07-04 MED ORDER — AMOXICILLIN 500 MG PO CAPS
500.0000 mg | ORAL_CAPSULE | Freq: Three times a day (TID) | ORAL | 0 refills | Status: DC
  Filled 2022-07-04: qty 21, 7d supply, fill #0

## 2022-07-13 ENCOUNTER — Other Ambulatory Visit (HOSPITAL_COMMUNITY): Payer: Self-pay

## 2022-07-24 ENCOUNTER — Other Ambulatory Visit: Payer: Self-pay | Admitting: Gastroenterology

## 2022-07-24 ENCOUNTER — Other Ambulatory Visit (HOSPITAL_COMMUNITY): Payer: Self-pay

## 2022-07-26 ENCOUNTER — Telehealth: Payer: Self-pay | Admitting: Gastroenterology

## 2022-07-26 ENCOUNTER — Other Ambulatory Visit (HOSPITAL_COMMUNITY): Payer: Self-pay

## 2022-07-26 NOTE — Telephone Encounter (Signed)
After hours call from patient requesting a refill of famotidine 40 mg QHS. She is also on pantoprazole but this refill is not needed at this time. She is going out of town 07/28/22 and was trying to get it filled prior to that time. Request for refill came through to the office earlier and was apparently denied because she needs to have an appointment first. Last seen in the office 8/22 and for endoscopy 10/22. No follow-up since that time.  Called to schedule an appointment as required to get a refill but the first available was not until February. As the medication is OTC and she did not schedule the appointment in February, I told her that I would ask Amanda/Dr. Fuller Plan to review 07/27/22.

## 2022-07-27 ENCOUNTER — Other Ambulatory Visit (HOSPITAL_COMMUNITY): Payer: Self-pay

## 2022-07-27 MED ORDER — FAMOTIDINE 40 MG PO TABS
40.0000 mg | ORAL_TABLET | Freq: Every day | ORAL | 0 refills | Status: DC
  Filled 2022-07-27: qty 30, 30d supply, fill #0

## 2022-07-27 NOTE — Addendum Note (Signed)
Addended by: Dorisann Frames L on: 07/27/2022 12:34 PM   Modules accepted: Orders

## 2022-07-27 NOTE — Telephone Encounter (Signed)
Informed patient that Dr. Fuller Plan prefers to see his patient's in the office every year if he is prescribing medications for them. Offered to schedule appt for February and send enough refills until scheduled appt or informed patient she can get her prescription transferred to her PCP if she sees them more frequently. Patient states she does see them more often but does need a refill for now. Informed patient I sent one refill to her pharmacy until she can get her PCP to take over the prescription. Patient verbalized understanding.

## 2022-08-04 ENCOUNTER — Other Ambulatory Visit (HOSPITAL_COMMUNITY): Payer: Self-pay

## 2022-08-15 ENCOUNTER — Encounter (HOSPITAL_COMMUNITY): Payer: Self-pay | Admitting: Pharmacist

## 2022-08-15 ENCOUNTER — Other Ambulatory Visit (HOSPITAL_COMMUNITY): Payer: Self-pay

## 2022-08-15 DIAGNOSIS — K5903 Drug induced constipation: Secondary | ICD-10-CM | POA: Diagnosis not present

## 2022-08-15 DIAGNOSIS — R7303 Prediabetes: Secondary | ICD-10-CM | POA: Diagnosis not present

## 2022-08-15 MED ORDER — OZEMPIC (0.25 OR 0.5 MG/DOSE) 2 MG/3ML ~~LOC~~ SOPN
0.5000 mg | PEN_INJECTOR | SUBCUTANEOUS | 1 refills | Status: DC
  Filled 2022-08-15: qty 3, 28d supply, fill #0

## 2022-08-16 ENCOUNTER — Other Ambulatory Visit (HOSPITAL_COMMUNITY): Payer: Self-pay

## 2022-08-18 ENCOUNTER — Other Ambulatory Visit (HOSPITAL_COMMUNITY): Payer: Self-pay

## 2022-08-21 ENCOUNTER — Other Ambulatory Visit (HOSPITAL_COMMUNITY): Payer: Self-pay

## 2022-08-29 ENCOUNTER — Other Ambulatory Visit (HOSPITAL_COMMUNITY): Payer: Self-pay

## 2022-09-05 DIAGNOSIS — L648 Other androgenic alopecia: Secondary | ICD-10-CM | POA: Diagnosis not present

## 2022-09-05 DIAGNOSIS — L218 Other seborrheic dermatitis: Secondary | ICD-10-CM | POA: Diagnosis not present

## 2022-09-06 ENCOUNTER — Other Ambulatory Visit (HOSPITAL_COMMUNITY): Payer: Self-pay

## 2022-09-07 DIAGNOSIS — L648 Other androgenic alopecia: Secondary | ICD-10-CM | POA: Diagnosis not present

## 2022-09-07 DIAGNOSIS — L650 Telogen effluvium: Secondary | ICD-10-CM | POA: Diagnosis not present

## 2022-09-07 DIAGNOSIS — Z79899 Other long term (current) drug therapy: Secondary | ICD-10-CM | POA: Diagnosis not present

## 2022-09-13 ENCOUNTER — Other Ambulatory Visit (HOSPITAL_COMMUNITY): Payer: Self-pay

## 2022-09-13 ENCOUNTER — Other Ambulatory Visit: Payer: Self-pay

## 2022-09-22 DIAGNOSIS — R5383 Other fatigue: Secondary | ICD-10-CM | POA: Diagnosis not present

## 2022-09-22 DIAGNOSIS — R52 Pain, unspecified: Secondary | ICD-10-CM | POA: Diagnosis not present

## 2022-09-22 DIAGNOSIS — Z03818 Encounter for observation for suspected exposure to other biological agents ruled out: Secondary | ICD-10-CM | POA: Diagnosis not present

## 2022-09-22 DIAGNOSIS — J029 Acute pharyngitis, unspecified: Secondary | ICD-10-CM | POA: Diagnosis not present

## 2022-09-22 DIAGNOSIS — J069 Acute upper respiratory infection, unspecified: Secondary | ICD-10-CM | POA: Diagnosis not present

## 2022-09-25 ENCOUNTER — Other Ambulatory Visit (HOSPITAL_BASED_OUTPATIENT_CLINIC_OR_DEPARTMENT_OTHER): Payer: Self-pay

## 2022-09-25 DIAGNOSIS — J0101 Acute recurrent maxillary sinusitis: Secondary | ICD-10-CM | POA: Diagnosis not present

## 2022-09-25 DIAGNOSIS — R7301 Impaired fasting glucose: Secondary | ICD-10-CM | POA: Diagnosis not present

## 2022-09-25 DIAGNOSIS — Z780 Asymptomatic menopausal state: Secondary | ICD-10-CM | POA: Diagnosis not present

## 2022-09-25 DIAGNOSIS — G4709 Other insomnia: Secondary | ICD-10-CM | POA: Diagnosis not present

## 2022-09-25 DIAGNOSIS — I872 Venous insufficiency (chronic) (peripheral): Secondary | ICD-10-CM | POA: Diagnosis not present

## 2022-09-25 DIAGNOSIS — M7632 Iliotibial band syndrome, left leg: Secondary | ICD-10-CM | POA: Diagnosis not present

## 2022-09-25 DIAGNOSIS — Z8639 Personal history of other endocrine, nutritional and metabolic disease: Secondary | ICD-10-CM | POA: Diagnosis not present

## 2022-09-25 MED ORDER — OZEMPIC (2 MG/DOSE) 8 MG/3ML ~~LOC~~ SOPN
2.0000 mg | PEN_INJECTOR | SUBCUTANEOUS | 1 refills | Status: DC
  Filled 2022-09-25: qty 3, 28d supply, fill #0

## 2022-09-28 ENCOUNTER — Other Ambulatory Visit: Payer: Self-pay

## 2022-09-28 ENCOUNTER — Other Ambulatory Visit (HOSPITAL_COMMUNITY): Payer: Self-pay

## 2022-09-29 DIAGNOSIS — H5213 Myopia, bilateral: Secondary | ICD-10-CM | POA: Diagnosis not present

## 2022-09-29 DIAGNOSIS — H01004 Unspecified blepharitis left upper eyelid: Secondary | ICD-10-CM | POA: Diagnosis not present

## 2022-09-29 DIAGNOSIS — H01001 Unspecified blepharitis right upper eyelid: Secondary | ICD-10-CM | POA: Diagnosis not present

## 2022-09-29 DIAGNOSIS — H04123 Dry eye syndrome of bilateral lacrimal glands: Secondary | ICD-10-CM | POA: Diagnosis not present

## 2022-09-29 DIAGNOSIS — H2513 Age-related nuclear cataract, bilateral: Secondary | ICD-10-CM | POA: Diagnosis not present

## 2022-10-03 DIAGNOSIS — L659 Nonscarring hair loss, unspecified: Secondary | ICD-10-CM | POA: Diagnosis not present

## 2022-10-03 DIAGNOSIS — L650 Telogen effluvium: Secondary | ICD-10-CM | POA: Diagnosis not present

## 2022-10-05 ENCOUNTER — Other Ambulatory Visit (HOSPITAL_COMMUNITY): Payer: Self-pay

## 2022-10-07 DIAGNOSIS — J069 Acute upper respiratory infection, unspecified: Secondary | ICD-10-CM | POA: Diagnosis not present

## 2022-10-07 DIAGNOSIS — J019 Acute sinusitis, unspecified: Secondary | ICD-10-CM | POA: Diagnosis not present

## 2022-10-11 DIAGNOSIS — L650 Telogen effluvium: Secondary | ICD-10-CM | POA: Diagnosis not present

## 2022-10-26 ENCOUNTER — Other Ambulatory Visit (HOSPITAL_COMMUNITY): Payer: Self-pay

## 2022-10-26 ENCOUNTER — Telehealth: Payer: Self-pay | Admitting: Gastroenterology

## 2022-10-26 MED ORDER — FAMOTIDINE 40 MG PO TABS
40.0000 mg | ORAL_TABLET | Freq: Every day | ORAL | 0 refills | Status: DC
  Filled 2022-10-26: qty 30, 30d supply, fill #0

## 2022-10-26 NOTE — Telephone Encounter (Signed)
Prescription sent to patient's pharmacy until scheduled appt. 

## 2022-10-26 NOTE — Telephone Encounter (Signed)
Inbound call from patient stating she needs refill for Pepcid. Patient was scheduled for OV with Dr. Fuller Plan on 4/22 at 2:10 and is requesting refill to be sent. Please advise.

## 2022-10-31 ENCOUNTER — Other Ambulatory Visit (HOSPITAL_COMMUNITY): Payer: Self-pay

## 2022-10-31 MED ORDER — ROSUVASTATIN CALCIUM 10 MG PO TABS
10.0000 mg | ORAL_TABLET | Freq: Every morning | ORAL | 3 refills | Status: DC
  Filled 2022-10-31: qty 90, 90d supply, fill #0
  Filled 2023-03-06: qty 90, 90d supply, fill #1
  Filled 2023-06-23: qty 90, 90d supply, fill #2
  Filled 2023-10-20: qty 90, 90d supply, fill #3

## 2022-12-04 ENCOUNTER — Ambulatory Visit: Payer: Medicare Other | Admitting: Gastroenterology

## 2022-12-11 ENCOUNTER — Other Ambulatory Visit (HOSPITAL_COMMUNITY): Payer: Self-pay

## 2022-12-11 DIAGNOSIS — E7800 Pure hypercholesterolemia, unspecified: Secondary | ICD-10-CM | POA: Diagnosis not present

## 2022-12-11 DIAGNOSIS — M25571 Pain in right ankle and joints of right foot: Secondary | ICD-10-CM | POA: Diagnosis not present

## 2022-12-11 DIAGNOSIS — R7303 Prediabetes: Secondary | ICD-10-CM | POA: Diagnosis not present

## 2022-12-11 DIAGNOSIS — M25572 Pain in left ankle and joints of left foot: Secondary | ICD-10-CM | POA: Diagnosis not present

## 2022-12-11 DIAGNOSIS — G8929 Other chronic pain: Secondary | ICD-10-CM | POA: Diagnosis not present

## 2022-12-11 MED ORDER — WEGOVY 0.25 MG/0.5ML ~~LOC~~ SOAJ
0.2500 mg | SUBCUTANEOUS | 1 refills | Status: AC
  Filled 2022-12-11: qty 2, 28d supply, fill #0

## 2022-12-12 ENCOUNTER — Other Ambulatory Visit (HOSPITAL_COMMUNITY): Payer: Self-pay

## 2022-12-13 ENCOUNTER — Other Ambulatory Visit (HOSPITAL_COMMUNITY): Payer: Self-pay

## 2022-12-13 MED ORDER — BUPROPION HCL ER (XL) 300 MG PO TB24
300.0000 mg | ORAL_TABLET | Freq: Every morning | ORAL | 4 refills | Status: AC
  Filled 2022-12-13 – 2023-03-06 (×2): qty 90, 90d supply, fill #0
  Filled 2023-06-23: qty 90, 90d supply, fill #1
  Filled 2023-10-20: qty 90, 90d supply, fill #2

## 2022-12-13 MED ORDER — TRAZODONE HCL 50 MG PO TABS
50.0000 mg | ORAL_TABLET | Freq: Every evening | ORAL | 4 refills | Status: AC | PRN
  Filled 2022-12-13: qty 270, 90d supply, fill #0
  Filled 2023-03-06: qty 270, 90d supply, fill #1
  Filled 2023-11-20: qty 270, 90d supply, fill #2

## 2022-12-14 ENCOUNTER — Ambulatory Visit
Admission: RE | Admit: 2022-12-14 | Discharge: 2022-12-14 | Disposition: A | Discharge status: Home / Self Care | Payer: Medicare Other | Source: Ambulatory Visit | Attending: Internal Medicine | Admitting: Internal Medicine

## 2022-12-14 ENCOUNTER — Other Ambulatory Visit: Payer: Self-pay | Admitting: Internal Medicine

## 2022-12-14 DIAGNOSIS — M79672 Pain in left foot: Secondary | ICD-10-CM

## 2022-12-14 DIAGNOSIS — I868 Varicose veins of other specified sites: Secondary | ICD-10-CM | POA: Diagnosis not present

## 2022-12-18 ENCOUNTER — Other Ambulatory Visit (HOSPITAL_COMMUNITY): Payer: Self-pay

## 2022-12-20 ENCOUNTER — Encounter: Payer: Self-pay | Admitting: Gastroenterology

## 2022-12-20 ENCOUNTER — Other Ambulatory Visit (HOSPITAL_COMMUNITY): Payer: Self-pay

## 2022-12-20 ENCOUNTER — Ambulatory Visit: Payer: Medicare Other | Admitting: Gastroenterology

## 2022-12-20 VITALS — BP 122/68 | HR 60 | Ht 70.0 in | Wt 187.0 lb

## 2022-12-20 DIAGNOSIS — Z86010 Personal history of colonic polyps: Secondary | ICD-10-CM

## 2022-12-20 DIAGNOSIS — K2100 Gastro-esophageal reflux disease with esophagitis, without bleeding: Secondary | ICD-10-CM

## 2022-12-20 MED ORDER — FAMOTIDINE 40 MG PO TABS
40.0000 mg | ORAL_TABLET | Freq: Every day | ORAL | 3 refills | Status: DC
  Filled 2022-12-20: qty 90, 90d supply, fill #0
  Filled 2023-05-12: qty 90, 90d supply, fill #1
  Filled 2023-08-21: qty 90, 90d supply, fill #2
  Filled 2023-11-20: qty 90, 90d supply, fill #3

## 2022-12-20 MED ORDER — PANTOPRAZOLE SODIUM 40 MG PO TBEC
40.0000 mg | DELAYED_RELEASE_TABLET | Freq: Every day | ORAL | 3 refills | Status: AC | PRN
  Filled 2022-12-20: qty 90, 90d supply, fill #0
  Filled 2023-05-09: qty 90, 90d supply, fill #1
  Filled 2023-08-21: qty 90, 90d supply, fill #2
  Filled 2023-12-10: qty 90, 90d supply, fill #3

## 2022-12-20 NOTE — Progress Notes (Signed)
    Assessment     GERD, LA Grade A esophagitis  Personal history of SSA, TA   Recommendations    Continue pantoprazole 40 mg qd, famotidine 40 mg hs, follow antireflux measures.  Surveillance colonoscopy recommended in October 2029 REV in 1 year   HPI    This is a 67 year old female she returns for follow-up of GERD.  Her reflux symptoms are well-controlled.  She frequently does not take her evening dose of famotidine when she feels her reflux symptoms are well-controlled.  If she notes any symptoms she will take her nighttime dose.   Labs / Imaging       Latest Ref Rng & Units 08/13/2013   11:42 AM 01/18/2012    1:43 PM 11/17/2011    2:21 PM  Hepatic Function  Total Protein 6.0 - 8.3 g/dL 6.6  6.4  6.7   Albumin 3.5 - 5.2 g/dL 4.1  3.9  3.9   AST 0 - 37 U/L 19  23  74   ALT 0 - 35 U/L 20  24  158   Alk Phosphatase 39 - 117 U/L 61  63  64   Total Bilirubin 0.3 - 1.2 mg/dL 0.8  0.7  0.5   Bilirubin, Direct 0.0 - 0.3 mg/dL 0.1  0.0  0.0        Latest Ref Rng & Units 08/13/2013   11:42 AM 11/17/2011    2:21 PM  CBC  WBC 4.5 - 10.5 K/uL 4.2  4.0   Hemoglobin 12.0 - 15.0 g/dL 16.1  09.6   Hematocrit 36.0 - 46.0 % 40.7  38.9   Platelets 150.0 - 400.0 K/uL 218.0  160.0     Current Medications, Allergies, Past Medical History, Past Surgical History, Family History and Social History were reviewed in [employer] record.   Physical Exam: General: Well developed, well nourished, no acute distress Head: Normocephalic and atraumatic Eyes: Sclerae anicteric, EOMI Ears: Normal auditory acuity Mouth: No deformities or lesions noted Lungs: Clear throughout to auscultation Heart: Regular rate and rhythm; No murmurs, rubs or bruits Abdomen: Soft, non tender and non distended. No masses, hepatosplenomegaly or hernias noted. Normal Bowel sounds Rectal: Not done Musculoskeletal: Symmetrical with no gross deformities  Pulses:  Normal pulses noted Extremities:  No edema or deformities noted Neurological: Alert oriented x 4, grossly nonfocal Psychological:  Alert and cooperative. Normal mood and affect   Megan Greer T. Russella Dar, MD 12/20/2022, 10:16 AM

## 2022-12-20 NOTE — Patient Instructions (Signed)
We have sent the following medications to your pharmacy for you to pick up at your convenience: pantoprazole and famotidine.   The [facility] providers would like to encourage you to use MYCHART to communicate with providers for non-urgent requests or questions.  Due to long hold times on the telephone, sending your provider a message by MYCHART may be a faster and more efficient way to get a response.  Please allow 48 business hours for a response.  Please remember that this is for non-urgent requests.   Thank you for choosing me and [facility].  Malcolm T. Stark, Jr., MD., FACG   

## 2022-12-25 ENCOUNTER — Other Ambulatory Visit: Payer: Self-pay | Admitting: Family Medicine

## 2022-12-25 DIAGNOSIS — E7800 Pure hypercholesterolemia, unspecified: Secondary | ICD-10-CM

## 2023-01-15 ENCOUNTER — Other Ambulatory Visit (HOSPITAL_COMMUNITY): Payer: Self-pay

## 2023-01-18 ENCOUNTER — Other Ambulatory Visit (HOSPITAL_COMMUNITY): Payer: Self-pay

## 2023-01-18 MED ORDER — LOTEPREDNOL ETABONATE 0.5 % OP SUSP
1.0000 [drp] | OPHTHALMIC | 0 refills | Status: DC
  Filled 2023-01-18 – 2023-02-01 (×2): qty 10, 7d supply, fill #0

## 2023-01-23 DIAGNOSIS — Z8639 Personal history of other endocrine, nutritional and metabolic disease: Secondary | ICD-10-CM | POA: Diagnosis not present

## 2023-01-23 DIAGNOSIS — R632 Polyphagia: Secondary | ICD-10-CM | POA: Diagnosis not present

## 2023-01-26 ENCOUNTER — Other Ambulatory Visit (HOSPITAL_COMMUNITY): Payer: Self-pay

## 2023-01-26 ENCOUNTER — Other Ambulatory Visit: Payer: Medicare Other

## 2023-02-01 ENCOUNTER — Other Ambulatory Visit (HOSPITAL_COMMUNITY): Payer: Self-pay

## 2023-02-13 ENCOUNTER — Other Ambulatory Visit: Payer: Medicare Other

## 2023-03-06 ENCOUNTER — Other Ambulatory Visit (HOSPITAL_COMMUNITY): Payer: Self-pay

## 2023-03-13 DIAGNOSIS — R632 Polyphagia: Secondary | ICD-10-CM | POA: Diagnosis not present

## 2023-03-13 DIAGNOSIS — R7303 Prediabetes: Secondary | ICD-10-CM | POA: Diagnosis not present

## 2023-03-14 ENCOUNTER — Other Ambulatory Visit (HOSPITAL_BASED_OUTPATIENT_CLINIC_OR_DEPARTMENT_OTHER): Payer: Self-pay | Admitting: Internal Medicine

## 2023-03-14 DIAGNOSIS — E663 Overweight: Secondary | ICD-10-CM

## 2023-03-14 DIAGNOSIS — E7800 Pure hypercholesterolemia, unspecified: Secondary | ICD-10-CM

## 2023-03-28 DIAGNOSIS — K219 Gastro-esophageal reflux disease without esophagitis: Secondary | ICD-10-CM | POA: Diagnosis not present

## 2023-03-28 DIAGNOSIS — E7800 Pure hypercholesterolemia, unspecified: Secondary | ICD-10-CM | POA: Diagnosis not present

## 2023-03-28 DIAGNOSIS — Z79899 Other long term (current) drug therapy: Secondary | ICD-10-CM | POA: Diagnosis not present

## 2023-03-28 DIAGNOSIS — K909 Intestinal malabsorption, unspecified: Secondary | ICD-10-CM | POA: Diagnosis not present

## 2023-03-28 DIAGNOSIS — Z23 Encounter for immunization: Secondary | ICD-10-CM | POA: Diagnosis not present

## 2023-03-28 DIAGNOSIS — R7303 Prediabetes: Secondary | ICD-10-CM | POA: Diagnosis not present

## 2023-03-28 DIAGNOSIS — M7989 Other specified soft tissue disorders: Secondary | ICD-10-CM | POA: Diagnosis not present

## 2023-03-28 DIAGNOSIS — M542 Cervicalgia: Secondary | ICD-10-CM | POA: Diagnosis not present

## 2023-03-28 DIAGNOSIS — Z0000 Encounter for general adult medical examination without abnormal findings: Secondary | ICD-10-CM | POA: Diagnosis not present

## 2023-03-29 ENCOUNTER — Other Ambulatory Visit: Payer: Self-pay | Admitting: Internal Medicine

## 2023-03-29 DIAGNOSIS — Z13820 Encounter for screening for osteoporosis: Secondary | ICD-10-CM

## 2023-03-30 ENCOUNTER — Other Ambulatory Visit: Payer: Self-pay | Admitting: Internal Medicine

## 2023-03-30 ENCOUNTER — Other Ambulatory Visit: Payer: Medicare Other

## 2023-03-30 DIAGNOSIS — Z1231 Encounter for screening mammogram for malignant neoplasm of breast: Secondary | ICD-10-CM

## 2023-04-03 ENCOUNTER — Ambulatory Visit (HOSPITAL_COMMUNITY)
Admission: RE | Admit: 2023-04-03 | Discharge: 2023-04-03 | Disposition: A | Discharge status: Home / Self Care | Payer: Medicare Other | Source: Ambulatory Visit | Attending: Internal Medicine | Admitting: Internal Medicine

## 2023-04-03 DIAGNOSIS — E663 Overweight: Secondary | ICD-10-CM | POA: Insufficient documentation

## 2023-04-03 DIAGNOSIS — E7800 Pure hypercholesterolemia, unspecified: Secondary | ICD-10-CM | POA: Insufficient documentation

## 2023-04-25 DIAGNOSIS — G4700 Insomnia, unspecified: Secondary | ICD-10-CM | POA: Diagnosis not present

## 2023-04-25 DIAGNOSIS — Z780 Asymptomatic menopausal state: Secondary | ICD-10-CM | POA: Diagnosis not present

## 2023-04-30 ENCOUNTER — Other Ambulatory Visit (HOSPITAL_COMMUNITY): Payer: Self-pay

## 2023-04-30 DIAGNOSIS — M25562 Pain in left knee: Secondary | ICD-10-CM | POA: Diagnosis not present

## 2023-04-30 DIAGNOSIS — I288 Other diseases of pulmonary vessels: Secondary | ICD-10-CM | POA: Diagnosis not present

## 2023-04-30 DIAGNOSIS — M542 Cervicalgia: Secondary | ICD-10-CM | POA: Diagnosis not present

## 2023-04-30 DIAGNOSIS — Z780 Asymptomatic menopausal state: Secondary | ICD-10-CM | POA: Diagnosis not present

## 2023-04-30 MED ORDER — TESTOSTERONE 50 MG/5GM (1%) TD GEL
0.5000 g | Freq: Every day | TRANSDERMAL | 1 refills | Status: AC
  Filled 2023-04-30: qty 150, 30d supply, fill #0
  Filled 2023-05-09: qty 150, 300d supply, fill #0

## 2023-05-01 ENCOUNTER — Other Ambulatory Visit (HOSPITAL_COMMUNITY): Payer: Self-pay

## 2023-05-01 DIAGNOSIS — H1132 Conjunctival hemorrhage, left eye: Secondary | ICD-10-CM | POA: Diagnosis not present

## 2023-05-09 ENCOUNTER — Other Ambulatory Visit (HOSPITAL_COMMUNITY): Payer: Self-pay

## 2023-05-09 ENCOUNTER — Other Ambulatory Visit: Payer: Self-pay

## 2023-05-09 DIAGNOSIS — M7742 Metatarsalgia, left foot: Secondary | ICD-10-CM | POA: Diagnosis not present

## 2023-05-09 DIAGNOSIS — M79672 Pain in left foot: Secondary | ICD-10-CM | POA: Diagnosis not present

## 2023-05-12 ENCOUNTER — Other Ambulatory Visit (HOSPITAL_COMMUNITY): Payer: Self-pay

## 2023-05-17 ENCOUNTER — Ambulatory Visit: Payer: Medicare Other | Attending: Cardiology | Admitting: Cardiology

## 2023-05-17 ENCOUNTER — Encounter: Payer: Self-pay | Admitting: Cardiology

## 2023-05-17 ENCOUNTER — Other Ambulatory Visit (HOSPITAL_COMMUNITY): Payer: Self-pay

## 2023-05-17 VITALS — BP 122/76 | HR 61 | Resp 16 | Ht 70.0 in | Wt 194.6 lb

## 2023-05-17 DIAGNOSIS — I370 Nonrheumatic pulmonary valve stenosis: Secondary | ICD-10-CM | POA: Diagnosis not present

## 2023-05-17 DIAGNOSIS — I288 Other diseases of pulmonary vessels: Secondary | ICD-10-CM

## 2023-05-17 DIAGNOSIS — I83892 Varicose veins of left lower extremities with other complications: Secondary | ICD-10-CM

## 2023-05-17 NOTE — Patient Instructions (Signed)
Medication Instructions:  Your physician recommends that you continue on your current medications as directed. Please refer to the Current Medication list given to you today.  *If you need a refill on your cardiac medications before your next appointment, please call your pharmacy*  Lab Work: None ordered today.  Testing/Procedures: Your physician has requested that you have an echocardiogram. Echocardiography is a painless test that uses sound waves to create images of your heart. It provides your doctor with information about the size and shape of your heart and how well your heart's chambers and valves are working. This procedure takes approximately one hour. There are no restrictions for this procedure. Please do NOT wear cologne, perfume, aftershave, or lotions (deodorant is allowed). Please arrive 15 minutes prior to your appointment time.  Follow-Up: At [facility], you and your health needs are our priority.  As part of our continuing mission to provide you with exceptional heart care, we have created designated Provider Care Teams.  These Care Teams include your primary Cardiologist (physician) and Advanced Practice Providers (APPs -  Physician Assistants and Nurse Practitioners) who all work together to provide you with the care you need, when you need it.  Your next appointment:   3 year(s)  The format for your next appointment:   In Person  Provider:   Yates Decamp, MD {

## 2023-05-17 NOTE — Progress Notes (Signed)
Cardiology Office Note:  .   Date:  05/17/2023  ID:  Megan Greer, DOB [birth year], MRN [id number] PCP: Emilio Aspen, MD  [facility] Providers Cardiologist:  Megan Decamp, MD    History of Present Illness: .   Megan Greer is a 67 y.o.Patient with GERD, devious hyperglycemia, hypercholesterolemia, referred to me for evaluation of exertional dyspnea and abnormal imaging study, coronary calcium score revealing dilated pulmonary artery.  She also has family history of premature coronary disease in her sister unfortunately she had CABG however she was a heavy smoker as well.  No other family members with premature coronary disease.  Discussed the use of AI scribe software for clinical note transcription with the patient, who gave verbal consent to proceed.  History of Present Illness   The patient, with a history of hyperlipidemia and prediabetes, presents with shortness of breath noticed about a year ago while working. The patient initially attributed the shortness of breath to increased weight and lack of exercise. The patient had lost about 25 pounds while on Ozempic, but has since gained it back after stopping the medication due to insurance issues. The patient also reports swelling in the left ankle, which is more pronounced than the right. The patient has been wearing support stockings for this issue. The patient has a family history of premature coronary disease, with a sister who had a quadruple bypass in her early fifties. The patient has been on rosuvastatin for hyperlipidemia for about five years.  Initial referral was made due to patient having stated dyspnea on exertion.  Patient now states that she is completely asymptomatic.    Review of Systems  Cardiovascular:  Negative for chest pain, dyspnea on exertion and leg swelling.   Risk Assessment/Calculations:     External Labs:  Labs 03/28/2023:  Hb 13.0/HCT 39.6, platelets 191, normal indicis.  BUN 23,  creatinine 0.71, EGFR 93 mL, potassium 4.2, LFTs normal.  Total cholesterol 210, triglycerides 79, HDL 81, LDL 115, non-HDL cholesterol 130.  A1c 5.4%.  Physical Exam:   VS:  BP 122/76 (BP Location: Left Arm, Patient Position: Sitting, Cuff Size: Large)   Pulse 61   Resp 16   Ht 5\' 10"  (1.778 m)   Wt 194 lb 9.6 oz (88.3 kg)   SpO2 94%   BMI 27.92 kg/m    Wt Readings from Last 3 Encounters:  05/17/23 194 lb 9.6 oz (88.3 kg)  12/20/22 187 lb (84.8 kg)  05/30/21 195 lb (88.5 kg)     Physical Exam Neck:     Vascular: No carotid bruit or JVD.  Cardiovascular:     Rate and Rhythm: Normal rate and regular rhythm.     Pulses: Intact distal pulses.     Heart sounds: A midsystolic click. Murmur heard.     Systolic murmur is present with a grade of 2/6 at the upper left sternal border.     No gallop.  Pulmonary:     Effort: Pulmonary effort is normal.     Breath sounds: Normal breath sounds.  Abdominal:     General: Bowel sounds are normal.     Palpations: Abdomen is soft.  Musculoskeletal:     Right lower leg: No edema.     Left lower leg: No edema.    Studies Reviewed: Marland Kitchen   EKG Interpretation Date/Time:  Thursday May 17 2023 15:09:40 EDT Ventricular Rate:  61 PR Interval:  140 QRS Duration:  96 QT Interval:  434 QTC Calculation:  436 R Axis:   -7  Text Interpretation: EKG 05/17/2023: Normal sinus rhythm at the rate of 61 bpm, normal axis, incomplete right bundle branch block.  Low-voltage complexes. Confirmed by Delrae Rend [phone]) on 05/17/2023 3:55:43 PM    Echocardiogram 04/04/2016: - Left ventricle: Normal LV global strain at -22.7% The cavity size   was normal. Systolic function was normal. The estimated ejection   fraction was in the range of 60% to 65%. Wall motion was normal;   there were no regional wall motion abnormalities. Left   ventricular diastolic function parameters were normal. - Mitral valve: There was trivial regurgitation. - Right ventricle:  The cavity size was moderately dilated. Wall   thickness was normal. - Pulmonic valve: Normal-appearing pulmonic valve, no evidence of pulmonary stenosis.  Peak gradient (S): 14 mm Hg.  Coronary calcium score 04/13/2023: Coronary calcium score of 0.  Ascending and descending thoracic aortic measurements are normal. Pulmonary artery is dilated measuring 32 mm. No significant extracardiac abnormality.   ASSESSMENT AND PLAN: .      ICD-10-CM   1. 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 Nonrheumatic pulmonary valve stenosis  I37.0 ECHOCARDIOGRAM COMPLETE    3. Varicose veins of left leg with edema  I83.892       Assessment and Plan    Pulmonary Stenosis Mild, likely congenital. No associated symptoms.  The pulmonary artery dilatation is secondary to mild pulmonary stenosis that was noted in 2017 where the mean gradient was 14 mmHg. -Order echocardiogram for further evaluation.  Unless this is markedly abnormal, would recommend repeat echocardiogram in 3 years unless symptoms develop and also advised the patient to follow-up with [place] at that time.  Advised her that if she notices dyspnea, leg edema, palpitations or chest pain to call us back.  I will review the echocardiogram and contact her if I see significant malady.  Hyperlipidemia Well controlled on Rosuvastatin 10mg . -Continue current medication regimen.  Prediabetes A1C 5.4%, improved likely due to weight loss with Ozempic and diet changes. Weight regained after discontinuation of Ozempic. -Encouraged strict vegetarian diet and exercise for weight loss.  Unilateral lower extremity edema Mild, more prominent on left side. Likely due to anatomical differences in venous return. -Continue use of compression stockings.  Family history of premature coronary disease Sister had CABG in her 36s, was a heavy smoker. -Continue Rosuvastatin for primary prevention.  Her coronary calcium  score was 0.     Signed,  Megan Decamp, MD, [facility] 05/17/2023, 6:30 PM

## 2023-06-01 ENCOUNTER — Ambulatory Visit (HOSPITAL_COMMUNITY): Payer: Medicare Other

## 2023-06-08 ENCOUNTER — Ambulatory Visit: Payer: Medicare Other

## 2023-06-21 ENCOUNTER — Ambulatory Visit (HOSPITAL_COMMUNITY): Payer: Medicare Other | Attending: Internal Medicine

## 2023-06-21 DIAGNOSIS — I288 Other diseases of pulmonary vessels: Secondary | ICD-10-CM

## 2023-06-21 DIAGNOSIS — I370 Nonrheumatic pulmonary valve stenosis: Secondary | ICD-10-CM | POA: Diagnosis not present

## 2023-06-21 LAB — ECHOCARDIOGRAM COMPLETE
Area-P 1/2: 2.44 cm2
S' Lateral: 3.3 cm

## 2023-06-22 ENCOUNTER — Ambulatory Visit: Payer: Medicare Other

## 2023-06-26 NOTE — Progress Notes (Signed)
Echocardiogram 06/21/2023:  1. Left ventricular ejection fraction, by estimation, is 65 to 70%. Left ventricular ejection fraction by 3D volume is 66 %. The left ventricle has normal function. The left ventricle has no regional wall motion abnormalities. Left ventricular diastolic  parameters are consistent with Grade I diastolic dysfunction (impaired relaxation).  2. Right ventricular systolic function is normal. The right ventricular size is normal. There is normal pulmonary artery systolic pressure. The estimated right ventricular systolic pressure is 21.7 mmHg.  3. The mitral valve is abnormal. Mild mitral valve regurgitation.  4. The aortic valve is tricuspid. Aortic valve regurgitation is not visualized.  5. The inferior vena cava is normal in size with <50% respiratory variability, suggesting right atrial pressure of 8 mmHg.

## 2023-06-28 ENCOUNTER — Other Ambulatory Visit (HOSPITAL_COMMUNITY): Payer: Self-pay

## 2023-06-28 ENCOUNTER — Ambulatory Visit
Admission: RE | Admit: 2023-06-28 | Discharge: 2023-06-28 | Disposition: A | Discharge status: Home / Self Care | Payer: Medicare Other | Source: Ambulatory Visit | Attending: Internal Medicine | Admitting: Internal Medicine

## 2023-06-28 DIAGNOSIS — Z1231 Encounter for screening mammogram for malignant neoplasm of breast: Secondary | ICD-10-CM | POA: Diagnosis not present

## 2023-07-03 ENCOUNTER — Other Ambulatory Visit (HOSPITAL_COMMUNITY): Payer: Self-pay

## 2023-07-05 DIAGNOSIS — Z8639 Personal history of other endocrine, nutritional and metabolic disease: Secondary | ICD-10-CM | POA: Diagnosis not present

## 2023-07-05 DIAGNOSIS — Z79899 Other long term (current) drug therapy: Secondary | ICD-10-CM | POA: Diagnosis not present

## 2023-07-05 DIAGNOSIS — E7800 Pure hypercholesterolemia, unspecified: Secondary | ICD-10-CM | POA: Diagnosis not present

## 2023-07-05 DIAGNOSIS — K219 Gastro-esophageal reflux disease without esophagitis: Secondary | ICD-10-CM | POA: Diagnosis not present

## 2023-07-05 DIAGNOSIS — Z72820 Sleep deprivation: Secondary | ICD-10-CM | POA: Diagnosis not present

## 2023-07-07 ENCOUNTER — Other Ambulatory Visit (HOSPITAL_COMMUNITY): Payer: Self-pay

## 2023-07-30 ENCOUNTER — Other Ambulatory Visit: Payer: Self-pay | Admitting: Family Medicine

## 2023-07-30 DIAGNOSIS — Z136 Encounter for screening for cardiovascular disorders: Secondary | ICD-10-CM

## 2023-07-31 DIAGNOSIS — Z8639 Personal history of other endocrine, nutritional and metabolic disease: Secondary | ICD-10-CM | POA: Diagnosis not present

## 2023-08-01 ENCOUNTER — Other Ambulatory Visit (HOSPITAL_COMMUNITY): Payer: Self-pay

## 2023-08-01 MED ORDER — AMPHETAMINE-DEXTROAMPHET ER 10 MG PO CP24
10.0000 mg | ORAL_CAPSULE | Freq: Every morning | ORAL | 0 refills | Status: DC
  Filled 2023-08-01 – 2023-08-22 (×3): qty 30, 30d supply, fill #0

## 2023-08-04 ENCOUNTER — Other Ambulatory Visit (HOSPITAL_COMMUNITY): Payer: Self-pay

## 2023-08-06 ENCOUNTER — Other Ambulatory Visit: Payer: Self-pay

## 2023-08-06 ENCOUNTER — Other Ambulatory Visit (HOSPITAL_COMMUNITY): Payer: Self-pay

## 2023-08-11 ENCOUNTER — Other Ambulatory Visit (HOSPITAL_COMMUNITY): Payer: Self-pay

## 2023-08-21 ENCOUNTER — Other Ambulatory Visit (HOSPITAL_COMMUNITY): Payer: Self-pay

## 2023-08-21 ENCOUNTER — Other Ambulatory Visit: Payer: Self-pay

## 2023-08-21 ENCOUNTER — Other Ambulatory Visit: Payer: Self-pay | Admitting: Family Medicine

## 2023-08-21 ENCOUNTER — Ambulatory Visit
Admission: RE | Admit: 2023-08-21 | Discharge: 2023-08-21 | Disposition: A | Discharge status: Home / Self Care | Payer: Medicare Other | Source: Ambulatory Visit | Attending: Family Medicine | Admitting: Family Medicine

## 2023-08-21 DIAGNOSIS — Z136 Encounter for screening for cardiovascular disorders: Secondary | ICD-10-CM

## 2023-08-22 ENCOUNTER — Other Ambulatory Visit (HOSPITAL_COMMUNITY): Payer: Self-pay

## 2023-08-23 DIAGNOSIS — Z8639 Personal history of other endocrine, nutritional and metabolic disease: Secondary | ICD-10-CM | POA: Diagnosis not present

## 2023-08-27 ENCOUNTER — Other Ambulatory Visit (HOSPITAL_COMMUNITY): Payer: Self-pay

## 2023-09-06 DIAGNOSIS — R7303 Prediabetes: Secondary | ICD-10-CM | POA: Diagnosis not present

## 2023-09-06 DIAGNOSIS — Z8639 Personal history of other endocrine, nutritional and metabolic disease: Secondary | ICD-10-CM | POA: Diagnosis not present

## 2023-09-06 DIAGNOSIS — G4700 Insomnia, unspecified: Secondary | ICD-10-CM | POA: Diagnosis not present

## 2023-09-06 DIAGNOSIS — E7800 Pure hypercholesterolemia, unspecified: Secondary | ICD-10-CM | POA: Diagnosis not present

## 2023-09-08 ENCOUNTER — Other Ambulatory Visit (HOSPITAL_COMMUNITY): Payer: Self-pay

## 2023-09-08 MED ORDER — OZEMPIC (0.25 OR 0.5 MG/DOSE) 2 MG/3ML ~~LOC~~ SOPN
0.5000 mg | PEN_INJECTOR | SUBCUTANEOUS | 0 refills | Status: AC
  Filled 2023-09-08: qty 3, 28d supply, fill #0

## 2023-09-10 ENCOUNTER — Other Ambulatory Visit (HOSPITAL_COMMUNITY): Payer: Self-pay

## 2023-09-18 DIAGNOSIS — R600 Localized edema: Secondary | ICD-10-CM | POA: Diagnosis not present

## 2023-09-18 DIAGNOSIS — I872 Venous insufficiency (chronic) (peripheral): Secondary | ICD-10-CM | POA: Diagnosis not present

## 2023-09-18 DIAGNOSIS — I83893 Varicose veins of bilateral lower extremities with other complications: Secondary | ICD-10-CM | POA: Diagnosis not present

## 2023-09-20 DIAGNOSIS — M542 Cervicalgia: Secondary | ICD-10-CM | POA: Diagnosis not present

## 2023-09-20 DIAGNOSIS — R0683 Snoring: Secondary | ICD-10-CM | POA: Diagnosis not present

## 2023-09-21 DIAGNOSIS — M542 Cervicalgia: Secondary | ICD-10-CM | POA: Diagnosis not present

## 2023-10-02 ENCOUNTER — Ambulatory Visit
Admission: RE | Admit: 2023-10-02 | Discharge: 2023-10-02 | Disposition: A | Discharge status: Home / Self Care | Payer: Medicare Other | Source: Ambulatory Visit | Attending: Internal Medicine | Admitting: Internal Medicine

## 2023-10-02 DIAGNOSIS — Z13820 Encounter for screening for osteoporosis: Secondary | ICD-10-CM

## 2023-10-02 DIAGNOSIS — M8588 Other specified disorders of bone density and structure, other site: Secondary | ICD-10-CM | POA: Diagnosis not present

## 2023-10-03 ENCOUNTER — Other Ambulatory Visit (HOSPITAL_COMMUNITY): Payer: Self-pay

## 2023-10-05 ENCOUNTER — Other Ambulatory Visit (HOSPITAL_COMMUNITY): Payer: Self-pay

## 2023-10-11 DIAGNOSIS — G4710 Hypersomnia, unspecified: Secondary | ICD-10-CM | POA: Diagnosis not present

## 2023-10-16 DIAGNOSIS — G4734 Idiopathic sleep related nonobstructive alveolar hypoventilation: Secondary | ICD-10-CM | POA: Diagnosis not present

## 2023-10-18 ENCOUNTER — Encounter: Payer: Self-pay | Admitting: Physical Therapy

## 2023-10-18 ENCOUNTER — Other Ambulatory Visit: Payer: Self-pay

## 2023-10-18 ENCOUNTER — Ambulatory Visit: Payer: Medicare Other | Attending: Internal Medicine | Admitting: Physical Therapy

## 2023-10-18 DIAGNOSIS — M542 Cervicalgia: Secondary | ICD-10-CM | POA: Insufficient documentation

## 2023-10-18 DIAGNOSIS — M6281 Muscle weakness (generalized): Secondary | ICD-10-CM | POA: Diagnosis not present

## 2023-10-18 DIAGNOSIS — G4486 Cervicogenic headache: Secondary | ICD-10-CM | POA: Insufficient documentation

## 2023-10-18 NOTE — Patient Instructions (Signed)

## 2023-10-18 NOTE — Therapy (Signed)
 OUTPATIENT PHYSICAL THERAPY CERVICAL EVALUATION   Patient Name: Megan Greer MRN: [id number] DOB:May 10, 1956, 68 y.o., female Today's Date: 10/18/2023  END OF SESSION:  PT End of Session - 10/18/23 1151     Visit Number 1    Date for PT Re-Evaluation 12/13/23    Authorization Type U[employer] Medicare auth req'd    PT Start Time 1147    PT Stop Time 1230    PT Time Calculation (min) 43 min    Activity Tolerance Patient tolerated treatment well             Past Medical History:  Diagnosis Date   Anxiety    Diverticulosis    GERD (gastroesophageal reflux disease)    Heart murmur    History of obesity    Hypercholesteremia    Hyperlipidemia    Insomnia    Mitral valve regurgitation    mild   Prediabetes    PVC (premature ventricular contraction)    Serrated adenoma of colon 2015   Sinusitis    Past Surgical History:  Procedure Laterality Date   BUNIONECTOMY     Right foot    DILATION AND CURETTAGE OF UTERUS     Patient Active Problem List   Diagnosis Date Noted   Abdominal pain, chronic, right lower quadrant 08/13/2013   Hypothyroidism 05/06/2013   Transaminitis 01/18/2012   Anxiety and depression 11/17/2011   Preventative health care 11/17/2011   Osteopenia 11/17/2011   Chronic insomnia 11/17/2011   Hyperlipidemia 11/17/2011   Gastroenteritis 11/17/2011    PCP: Eleanora Neighbor MD  REFERRING PROVIDER: Eleanora Neighbor MD  REFERRING DIAG: M54.2 Neck pain  THERAPY DIAG:  Neck pain; weakness; cervicogenic headache  Rationale for Evaluation and Treatment: Rehabilitation  ONSET DATE: 1 year ago  SUBJECTIVE:                                                                                                                                                                                                         SUBJECTIVE STATEMENT: Left neck, upper trap, upper left arm starting 1 year ago; insidious onset; moderate headaches that come  frequently PERTINENT HISTORY:  Had DN for hip in the past (hip pain has fully resolved) Osteopenia Knee meniscal debridement  PAIN:  Are you having pain? Yes NPRS scale: 3-4/10, with movement 6-7 Pain location: left neck, upper trap, headaches Pain orientation: Left  PAIN TYPE: aching Pain description: constant  Aggravating factors: takes Tylenol on nightly basis to get to sleep, hurts in the AM; turning to the left > right (limited); sidebending right/left  Relieving factors: Tylenol, Voltaren, heat  PRECAUTIONS: None   WEIGHT BEARING RESTRICTIONS: No  FALLS:  Has patient fallen in last 6 months? No LIVING ENVIRONMENT: Lives with: lives with their spouse Lives in: House/apartment Stairs: ranch home but some steps to higher room    OCCUPATION: works 2x/week  (nursing)   PLOF: Independent  PATIENT GOALS: lessen pain; engage in more activities; better quality of life  NEXT MD VISIT: as needed  OBJECTIVE:  Note: Objective measures were completed at Evaluation unless otherwise noted.  DIAGNOSTIC FINDINGS:  Had U/S on neck slight Dr. Oda Kilts did x-ray of shoulder saw OA "might need replacement in future"  Has been referred to endocrinologist for osteoporosis (appt next week) ASSESSMENT: Feb 2025: The BMD measured at [facility] Spine L1-L3 is 0.909 g/cm2 with a T-score of -2.2. This patient is considered osteopenic/low bone mass according to World Health Organization [facility]) criteria.   The quality of the exam is good. L4 was excluded due to degenerative changes.   Site Region Measured Date Measured Age YA BMD Significant CHANGE T-score AP Spine  L1-L3      10/02/20[age]         -2.1    0.909 g/cm2   DualFemur Neck Right 10/02/2023    68.5         -1.3    0.860 g/cm2   DualFemur Total Mean 10/02/2023    68.5         -0.8    0.906 g/cm2   World Health Organization [facility]) criteria for post-menopausal, Caucasian Women: Normal       T-score at or above -1 SD Osteopenia    T-score between -1 and -2.5 SD Osteoporosis T-score at or below -2.5 SD  PATIENT SURVEYS:  NDI 32%  COGNITION: Overall cognitive status: Within functional limits for tasks assessed  POSTURE: No Significant postural limitations  PALPATION: Tender points in left upper trap and levator scap muscles; fascial restriction suboccipitals   CERVICAL ROM:   Active ROM A/PROM (deg) eval  Flexion 45  Extension 50  Right lateral flexion 24  Left lateral flexion 34  Right rotation 43  Left rotation 18   (Blank rows = not tested)  Deep cervical flexor and extensors strength 4/5  UPPER EXTREMITY ROM: bil shoulder flexion and abduction 155 degrees, internal rotation T8  UPPER EXTREMITY MMT: glenohumeral strength grossly 4+/5. Scapular depressors and retractors 4-/5  CERVICAL SPECIAL TESTS: + distraction   TREATMENT DATE: 10/18/23 evaluation     Trigger Point Dry Needling Initial Treatment: Pt instructed on Dry Needling rational, procedures, and possible side effects. Pt instructed to expect mild to moderate muscle soreness later in the day and/or into the next day.  Pt instructed in methods to reduce muscle soreness. Patient verbalized understanding of these instructions and education.  Patient Verbal Consent Given: Yes Education Handout Provided: Yes Muscles Treated: left upper trap Electrical Stimulation Performed: No Treatment Response/Outcome: improved soft tissue mobility and decreased tender point size and number  PATIENT EDUCATION:  Education details: Educated patient on anatomy and physiology of current symptoms, prognosis, plan of care as well as initial self care strategies to promote recovery Person educated: Patient Education method: Explanation Education comprehension: verbalized understanding  HOME EXERCISE PROGRAM: To be started; encouraged  cervical rotation and sidebending ROM post DN  ASSESSMENT:  CLINICAL IMPRESSION: Patient is a 68 y.o. female who was seen today for physical therapy evaluation and treatment for neck pain and headaches. The patient would benefit from skilled PT to address decreased cervical range of motion, correct muscle strength asymmetries and weakness in deep cervical flexors and extensors, improve postural strength including and scapular retractor and depressors and address pain levels.  All affect patient's ability to perform home and work ADLS including reading, using the computer, driving, sleeping and lifting/carrying items like groceries.     OBJECTIVE IMPAIRMENTS: decreased activity tolerance, decreased ROM, decreased strength, increased fascial restrictions, impaired perceived functional ability, and pain.   ACTIVITY LIMITATIONS: carrying, lifting, sleeping, and caring for others  PARTICIPATION LIMITATIONS: cleaning, laundry, driving, and occupation  PERSONAL FACTORS: Time since onset of injury/illness/exacerbation and 1 comorbidity: osteopenia  are also affecting patient's functional outcome.   REHAB POTENTIAL: Good  CLINICAL DECISION MAKING: Stable/uncomplicated  EVALUATION COMPLEXITY: Low   GOALS: Goals reviewed with patient? Yes  SHORT TERM GOALS: Target date: 11/15/2023   The patient will demonstrate knowledge of basic self care strategies and exercises to promote healing  Baseline:  Goal status: INITIAL  2.  The patient will report a 30% improvement in neck and headache pain levels with functional activities which are currently difficult including turning her head, going to sleep and first thing in the AM Baseline:  Goal status: INITIAL  3.  Improved cervical rotation to 50 degrees needed for driving Baseline:  Goal status: INITIAL   LONG TERM GOALS: Target date: 12/13/2023   The patient will be independent in a safe self progression of a home exercise program to promote  further recovery of function  Baseline:  Goal status: INITIAL  2.  The patient will report a 60% improvement in neck and headache pain levels with functional activities which are currently difficult including turning her head, going to sleep and first thing in the AM Baseline:  Goal status: INITIAL  3.  Cervical and scapular strength grossly 4+/5 needed for carrying shopping bags and luggage Baseline:  Goal status: INITIAL  4.  Cervical sidebending to 40 degrees and rotation to 55 degrees needed for driving Baseline:  Goal status: INITIAL  5.  Neck Disability Index improved to    24 % indicating improved function with less pain Baseline:  Goal status: INITIAL    PLAN:  PT FREQUENCY: 2x/week  PT DURATION: 8 weeks  PLANNED INTERVENTIONS: 97164- PT Re-evaluation, 97110-Therapeutic exercises, 97530- Therapeutic activity, 97112- Neuromuscular re-education, 97535- Self Care, 14782- Manual therapy, [phone]- Aquatic Therapy, [id number]- Electrical stimulation (unattended), [phone]- Electrical stimulation (manual), [id number]- Ultrasound, 62952- Traction (mechanical), [id number]- Ionotophoresis 4mg /ml Dexamethasone, Patient/Family education, Taping, Dry Needling, Joint mobilization, Spinal mobilization, Cryotherapy, and Moist heat  PLAN FOR NEXT SESSION: assess response to DN#1 and add cervical multifidi, levator scap and suboccipitals; start upper trap stretch, cervical SNAG with towel   Lavinia Sharps, PT 10/18/23 7:38 PM Phone: [phone] Fax: [phone]

## 2023-10-20 ENCOUNTER — Other Ambulatory Visit (HOSPITAL_COMMUNITY): Payer: Self-pay

## 2023-10-21 ENCOUNTER — Other Ambulatory Visit: Payer: Self-pay

## 2023-10-23 DIAGNOSIS — R7303 Prediabetes: Secondary | ICD-10-CM | POA: Diagnosis not present

## 2023-10-23 DIAGNOSIS — K219 Gastro-esophageal reflux disease without esophagitis: Secondary | ICD-10-CM | POA: Diagnosis not present

## 2023-10-23 DIAGNOSIS — K909 Intestinal malabsorption, unspecified: Secondary | ICD-10-CM | POA: Diagnosis not present

## 2023-10-23 DIAGNOSIS — M8580 Other specified disorders of bone density and structure, unspecified site: Secondary | ICD-10-CM | POA: Diagnosis not present

## 2023-10-23 DIAGNOSIS — M542 Cervicalgia: Secondary | ICD-10-CM | POA: Diagnosis not present

## 2023-10-23 DIAGNOSIS — E7800 Pure hypercholesterolemia, unspecified: Secondary | ICD-10-CM | POA: Diagnosis not present

## 2023-10-25 ENCOUNTER — Other Ambulatory Visit (HOSPITAL_COMMUNITY): Payer: Self-pay

## 2023-10-26 ENCOUNTER — Other Ambulatory Visit (HOSPITAL_COMMUNITY): Payer: Self-pay

## 2023-10-26 DIAGNOSIS — H5213 Myopia, bilateral: Secondary | ICD-10-CM | POA: Diagnosis not present

## 2023-10-26 DIAGNOSIS — H2513 Age-related nuclear cataract, bilateral: Secondary | ICD-10-CM | POA: Diagnosis not present

## 2023-10-26 DIAGNOSIS — H04123 Dry eye syndrome of bilateral lacrimal glands: Secondary | ICD-10-CM | POA: Diagnosis not present

## 2023-10-26 DIAGNOSIS — H02834 Dermatochalasis of left upper eyelid: Secondary | ICD-10-CM | POA: Diagnosis not present

## 2023-10-31 ENCOUNTER — Ambulatory Visit: Admitting: Physical Therapy

## 2023-10-31 DIAGNOSIS — M6281 Muscle weakness (generalized): Secondary | ICD-10-CM | POA: Diagnosis not present

## 2023-10-31 DIAGNOSIS — M542 Cervicalgia: Secondary | ICD-10-CM

## 2023-10-31 DIAGNOSIS — G4486 Cervicogenic headache: Secondary | ICD-10-CM | POA: Diagnosis not present

## 2023-10-31 NOTE — Therapy (Signed)
 OUTPATIENT PHYSICAL THERAPY CERVICAL PROGRESS NOTE    Patient Name: Megan Greer MRN: [id number] DOB:April 22, 1956, 68 y.o., female Today's Date: 10/31/2023  END OF SESSION:  PT End of Session - 10/31/23 1534     Visit Number 2    Number of Visits 8    Date for PT Re-Evaluation 12/13/23    Authorization Type [employer] Medicare 3/6- 5/1    PT Start Time 1531    PT Stop Time 1615    PT Time Calculation (min) 44 min    Activity Tolerance Patient tolerated treatment well             Past Medical History:  Diagnosis Date   Anxiety    Diverticulosis    GERD (gastroesophageal reflux disease)    Heart murmur    History of obesity    Hypercholesteremia    Hyperlipidemia    Insomnia    Mitral valve regurgitation    mild   Prediabetes    PVC (premature ventricular contraction)    Serrated adenoma of colon 2015   Sinusitis    Past Surgical History:  Procedure Laterality Date   BUNIONECTOMY     Right foot    DILATION AND CURETTAGE OF UTERUS     Patient Active Problem List   Diagnosis Date Noted   Abdominal pain, chronic, right lower quadrant 08/13/2013   Hypothyroidism 05/06/2013   Transaminitis 01/18/2012   Anxiety and depression 11/17/2011   Preventative health care 11/17/2011   Osteopenia 11/17/2011   Chronic insomnia 11/17/2011   Hyperlipidemia 11/17/2011   Gastroenteritis 11/17/2011    PCP: Eleanora Neighbor MD  REFERRING PROVIDER: Eleanora Neighbor MD  REFERRING DIAG: M54.2 Neck pain  THERAPY DIAG:  Neck pain; weakness; cervicogenic headache  Rationale for Evaluation and Treatment: Rehabilitation  ONSET DATE: 1 year ago  SUBJECTIVE:                                                                                                                                                                                                         SUBJECTIVE STATEMENT: The DN helped a lot for 4-5 days.  It has started to come back.  Not really sore, felt  better. PERTINENT HISTORY:   Had DN for hip in the past (hip pain has fully resolved) Osteopenia Knee meniscal debridement  PAIN:  Are you having pain? Yes NPRS scale: 4/10 Pain location: left neck, upper trap, headaches Pain orientation: Left  PAIN TYPE: aching Pain description: constant  Aggravating factors: takes Tylenol on nightly basis to get to sleep, hurts in the  AM; turning to the left > right (limited); sidebending right/left Relieving factors: Tylenol, Voltaren, heat  PRECAUTIONS: None   WEIGHT BEARING RESTRICTIONS: No  FALLS:  Has patient fallen in last 6 months? No LIVING ENVIRONMENT: Lives with: lives with their spouse Lives in: House/apartment Stairs: ranch home but some steps to higher room    OCCUPATION: works 2x/week  (nursing)   PLOF: Independent  PATIENT GOALS: lessen pain; engage in more activities; better quality of life  NEXT MD VISIT: as needed  OBJECTIVE:  Note: Objective measures were completed at Evaluation unless otherwise noted.  DIAGNOSTIC FINDINGS:  Had U/S on neck slight Dr. Oda Kilts did x-ray of shoulder saw OA "might need replacement in future"  Has been referred to endocrinologist for osteoporosis (appt next week) ASSESSMENT: Feb 2025: The BMD measured at [facility] L1-L3 is 0.909 g/cm2 with a T-score of -2.2. This patient is considered osteopenic/low bone mass according to World Health Organization [facility]) criteria.   The quality of the exam is good. L4 was excluded due to degenerative changes.   Site Region Measured Date Measured Age YA BMD Significant CHANGE T-score AP Spine  L1-L3      10/02/2023    67.5         -2.2    0.909 g/cm2   DualFemur Neck Right 10/02/2023    68.5         -1.3    0.860 g/cm2   DualFemur Total Mean 10/02/2023    68.5         -0.8    0.906 g/cm2   World Health Organization [facility]) criteria for post-menopausal, Caucasian Women: Normal       T-score at or above -1 SD Osteopenia   T-score between -1  and -2.5 SD Osteoporosis T-score at or below -2.5 SD  PATIENT SURVEYS:  NDI 32%  COGNITION: Overall cognitive status: Within functional limits for tasks assessed  POSTURE: No Significant postural limitations  PALPATION: Tender points in left upper trap and levator scap muscles; fascial restriction suboccipitals   CERVICAL ROM:   Active ROM A/PROM (deg) eval  Flexion 45  Extension 50  Right lateral flexion 24  Left lateral flexion 34  Right rotation 43  Left rotation 18   (Blank rows = not tested)  Deep cervical flexor and extensors strength 4/5  UPPER EXTREMITY ROM: bil shoulder flexion and abduction 155 degrees, internal rotation T8  UPPER EXTREMITY MMT: glenohumeral strength grossly 4+/5. Scapular depressors and retractors 4-/5  CERVICAL SPECIAL TESTS: + distraction   TREATMENT DATE: 10/31/23 Seated upper trap stretch 3x 30 sec hold (added to HEP) Seated levator scap stretch 3x 30 sec hold (added to HEP) Scap retract 10x (added to HEP) Cervical SNAG 8x right/left (added to HEP) Manual therapy: soft tissue mobilization to left upper trap, levator scap, cervical paraspinals, left suboccipitals Trigger Point Dry Needling Subsequent Treatment: Instructions provided previously at initial dry needling treatment.  Patient Verbal Consent Given: Yes Education Handout Provided: Previously Provided Muscles Treated: left only: upper trap, levator scap, cervical multifidi, suboccipitals 2-3 spots each Electrical Stimulation Performed: No Treatment Response/Outcome: improved soft tissue mobility and decreased tender point size/number  TREATMENT DATE: 10/18/23 evaluation     Trigger Point Dry Needling Initial Treatment: Pt instructed on Dry Needling rational, procedures, and possible side effects. Pt instructed to expect mild to moderate muscle soreness later in the day and/or into the next day.  Pt instructed in methods to reduce muscle soreness. Patient verbalized  understanding of these instructions  and education.  Patient Verbal Consent Given: Yes Education Handout Provided: Yes Muscles Treated: left upper trap Electrical Stimulation Performed: No Treatment Response/Outcome: improved soft tissue mobility and decreased tender point size and number                                                                                                                               PATIENT EDUCATION:  Education details: Educated patient on anatomy and physiology of current symptoms, prognosis, plan of care as well as initial self care strategies to promote recovery Person educated: Patient Education method: Explanation Education comprehension: verbalized understanding  HOME EXERCISE PROGRAM: Access Code: [email or web address] URL: [email or web address] Date: 10/31/2023 Prepared by: Lavinia Sharps  Exercises - Seated Upper Trapezius Stretch  - 1 x daily - 7 x weekly - 1 sets - 2-3 reps - 30 hold - Seated Levator Scapulae Stretch  - 1 x daily - 7 x weekly - 1 sets - 2-3 reps - 30 hold - Seated Assisted Cervical Rotation with Towel  - 1 x daily - 7 x weekly - 1 sets - 8 reps - Seated Scapular Retraction  - 1 x daily - 7 x weekly - 1 sets - 10 reps ASSESSMENT:  CLINICAL IMPRESSION: The patient benefits from dry needling and manual therapy to stimulate underlying myofascial trigger points and muscular tissue for management of neuromusculoskeletal pain and address movement impairments.  The patient was encouraged in regular performance of HEP post DN including soft tissue lengthening and strengthening exercises to enhance long term benefit. Decreased treatment frequency to 1x/week secondary to limited visits approved by insurance.    OBJECTIVE IMPAIRMENTS: decreased activity tolerance, decreased ROM, decreased strength, increased fascial restrictions, impaired perceived functional ability, and pain.   ACTIVITY LIMITATIONS: carrying, lifting, sleeping, and  caring for others  PARTICIPATION LIMITATIONS: cleaning, laundry, driving, and occupation  PERSONAL FACTORS: Time since onset of injury/illness/exacerbation and 1 comorbidity: osteopenia  are also affecting patient's functional outcome.   REHAB POTENTIAL: Good  CLINICAL DECISION MAKING: Stable/uncomplicated  EVALUATION COMPLEXITY: Low   GOALS: Goals reviewed with patient? Yes  SHORT TERM GOALS: Target date: 11/15/2023   The patient will demonstrate knowledge of basic self care strategies and exercises to promote healing  Baseline:  Goal status: INITIAL  2.  The patient will report a 30% improvement in neck and headache pain levels with functional activities which are currently difficult including turning her head, going to sleep and first thing in the AM Baseline:  Goal status: INITIAL  3.  Improved cervical rotation to 50 degrees needed for driving Baseline:  Goal status: INITIAL   LONG TERM GOALS: Target date: 12/13/2023   The patient will be independent in a safe self progression of a home exercise program to promote further recovery of function  Baseline:  Goal status: INITIAL  2.  The patient will report a 60% improvement in neck and headache pain levels with functional activities which are currently difficult including  turning her head, going to sleep and first thing in the AM Baseline:  Goal status: INITIAL  3.  Cervical and scapular strength grossly 4+/5 needed for carrying shopping bags and luggage Baseline:  Goal status: INITIAL  4.  Cervical sidebending to 40 degrees and rotation to 55 degrees needed for driving Baseline:  Goal status: INITIAL  5.  Neck Disability Index improved to    24 % indicating improved function with less pain Baseline:  Goal status: INITIAL    PLAN:  PT FREQUENCY: 2x/week  PT DURATION: 8 weeks  PLANNED INTERVENTIONS: 97164- PT Re-evaluation, 97110-Therapeutic exercises, 97530- Therapeutic activity, 97112- Neuromuscular  re-education, 97535- Self Care, 45409- Manual therapy, [phone]- Aquatic Therapy, [id number]- Electrical stimulation (unattended), [phone]- Electrical stimulation (manual), [id number]- Ultrasound, 56213- Traction (mechanical), [id number]- Ionotophoresis 4mg /ml Dexamethasone, Patient/Family education, Taping, Dry Needling, Joint mobilization, Spinal mobilization, Cryotherapy, and Moist heat  PLAN FOR NEXT SESSION: assess response to DN#2 left upper trap, cervical multifidi, levator scap and suboccipitals; add wall ex's, open books; dec frequency to 1x/week secondary to limited insurance visits approved  Lavinia Sharps, PT 10/31/23 4:27 PM Phone: [phone] Fax: [phone]

## 2023-11-01 ENCOUNTER — Other Ambulatory Visit (HOSPITAL_COMMUNITY): Payer: Self-pay

## 2023-11-06 ENCOUNTER — Ambulatory Visit: Admitting: Physical Therapy

## 2023-11-08 ENCOUNTER — Ambulatory Visit: Admitting: Physical Therapy

## 2023-11-08 DIAGNOSIS — M542 Cervicalgia: Secondary | ICD-10-CM | POA: Diagnosis not present

## 2023-11-08 DIAGNOSIS — M6281 Muscle weakness (generalized): Secondary | ICD-10-CM

## 2023-11-08 DIAGNOSIS — G4486 Cervicogenic headache: Secondary | ICD-10-CM | POA: Diagnosis not present

## 2023-11-08 NOTE — Therapy (Signed)
 OUTPATIENT PHYSICAL THERAPY CERVICAL PROGRESS NOTE    Patient Name: Megan Greer MRN: [id number] DOB:1956/05/10, 68 y.o., female Today's Date: 11/08/2023  END OF SESSION:  PT End of Session - 11/08/23 1150     Visit Number 3    Number of Visits 8    Date for PT Re-Evaluation 12/13/23    Authorization Type [employer] Medicare 3/6- 5/1    PT Start Time 1148    PT Stop Time 1229    PT Time Calculation (min) 41 min    Activity Tolerance Patient tolerated treatment well             Past Medical History:  Diagnosis Date   Anxiety    Diverticulosis    GERD (gastroesophageal reflux disease)    Heart murmur    History of obesity    Hypercholesteremia    Hyperlipidemia    Insomnia    Mitral valve regurgitation    mild   Prediabetes    PVC (premature ventricular contraction)    Serrated adenoma of colon 2015   Sinusitis    Past Surgical History:  Procedure Laterality Date   BUNIONECTOMY     Right foot    DILATION AND CURETTAGE OF UTERUS     Patient Active Problem List   Diagnosis Date Noted   Abdominal pain, chronic, right lower quadrant 08/13/2013   Hypothyroidism 05/06/2013   Transaminitis 01/18/2012   Anxiety and depression 11/17/2011   Preventative health care 11/17/2011   Osteopenia 11/17/2011   Chronic insomnia 11/17/2011   Hyperlipidemia 11/17/2011   Gastroenteritis 11/17/2011    PCP: Eleanora Neighbor MD  REFERRING PROVIDER: Eleanora Neighbor MD  REFERRING DIAG: M54.2 Neck pain  THERAPY DIAG:  Neck pain; weakness; cervicogenic headache  Rationale for Evaluation and Treatment: Rehabilitation  ONSET DATE: 1 year ago  SUBJECTIVE:                                                                                                                                                                                                         SUBJECTIVE STATEMENT: Lots of fatigue probably from long work hours.  I'm 50% better. The DN is always a relief and I  feel better right away.  I feel it today in left upper trap/levator scap.  I do have some shoulder issues.    PERTINENT HISTORY:   Had DN for hip in the past (hip pain has fully resolved) Osteopenia Knee meniscal debridement  PAIN:  Are you having pain? Yes NPRS scale: 4/10 Pain location: left neck, upper trap, headaches Pain orientation:  Left  PAIN TYPE: aching Pain description: constant  Aggravating factors: takes Tylenol on nightly basis to get to sleep, hurts in the AM; turning to the left > right (limited); sidebending right/left Relieving factors: Tylenol, Voltaren, heat  PRECAUTIONS: None   WEIGHT BEARING RESTRICTIONS: No  FALLS:  Has patient fallen in last 6 months? No LIVING ENVIRONMENT: Lives with: lives with their spouse Lives in: House/apartment Stairs: ranch home but some steps to higher room    OCCUPATION: works 2x/week  (nursing)   PLOF: Independent  PATIENT GOALS: lessen pain; engage in more activities; better quality of life  NEXT MD VISIT: as needed  OBJECTIVE:  Note: Objective measures were completed at Evaluation unless otherwise noted.  DIAGNOSTIC FINDINGS:  Had U/S on neck slight Dr. Oda Kilts did x-ray of shoulder saw OA "might need replacement in future"  Has been referred to endocrinologist for osteoporosis (appt next week) ASSESSMENT: Feb 2025: The BMD measured at AP Spine L1-L3 is 0.909 g/cm2 with a T-score of -2.2. This patient is considered osteopenic/low bone mass according to World Health Organization [facility]) criteria.   The quality of the exam is good. L4 was excluded due to degenerative changes.   Site Region Measured Date Measured Age YA BMD Significant CHANGE T-score AP Spine  L1-L3      10/02/20[age]         -2.1    0.909 g/cm2   DualFemur Neck Right 10/02/2023    67.5         -1.3    0.860 g/cm2   DualFemur Total Mean 10/02/2023    68.5         -0.8    0.906 g/cm2   World Health Organization [facility]) criteria for  post-menopausal, Caucasian Women: Normal       T-score at or above -1 SD Osteopenia   T-score between -1 and -2.5 SD Osteoporosis T-score at or below -2.5 SD  PATIENT SURVEYS:  NDI 32%  COGNITION: Overall cognitive status: Within functional limits for tasks assessed  POSTURE: No Significant postural limitations  PALPATION: Tender points in left upper trap and levator scap muscles; fascial restriction suboccipitals   CERVICAL ROM:   Active ROM A/PROM (deg) eval  Flexion 45  Extension 50  Right lateral flexion 24  Left lateral flexion 34  Right rotation 43  Left rotation 18   (Blank rows = not tested)  Deep cervical flexor and extensors strength 4/5  UPPER EXTREMITY ROM: bil shoulder flexion and abduction 155 degrees, internal rotation T8  UPPER EXTREMITY MMT: glenohumeral strength grossly 4+/5. Scapular depressors and retractors 4-/5  CERVICAL SPECIAL TESTS: + distraction TREATMENT DATE: 11/08/23 Red band rows (easy) increased to green band resistance 2x 10 (added to HEP) Red band bil shoulder extension (easy) increased to green band resistance 2x10 (added to HEP) Seated upper trap stretch 3x 30 sec hold  Seated levator scap stretch 3x 30 sec hold  Cervical SNAG 8x right/left (added to HEP) Manual therapy: soft tissue mobilization to left upper trap, levator scap, cervical paraspinals, left suboccipitals Trigger Point Dry Needling Subsequent Treatment: Instructions provided previously at initial dry needling treatment.  Patient Verbal Consent Given: Yes Education Handout Provided: Previously Provided Muscles Treated: left only: upper trap, levator scap, cervical multifidi, suboccipitals 2-3 spots each Electrical Stimulation Performed: No Treatment Response/Outcome: improved soft tissue mobility and decreased tender point size/numbert  TREATMENT DATE: 10/31/23 Seated upper trap stretch 3x 30 sec hold (added to HEP) Seated levator scap stretch 3x  30 sec hold (added to  HEP) Scap retract 10x (added to HEP) Cervical SNAG 8x right/left (added to HEP) Manual therapy: soft tissue mobilization to left upper trap, levator scap, cervical paraspinals, left suboccipitals Trigger Point Dry Needling Subsequent Treatment: Instructions provided previously at initial dry needling treatment.  Patient Verbal Consent Given: Yes Education Handout Provided: Previously Provided Muscles Treated: left only: upper trap, levator scap, cervical multifidi, suboccipitals 2-3 spots each Electrical Stimulation Performed: No Treatment Response/Outcome: improved soft tissue mobility and decreased tender point size/number  TREATMENT DATE: 10/18/23 evaluation     Trigger Point Dry Needling Initial Treatment: Pt instructed on Dry Needling rational, procedures, and possible side effects. Pt instructed to expect mild to moderate muscle soreness later in the day and/or into the next day.  Pt instructed in methods to reduce muscle soreness. Patient verbalized understanding of these instructions and education.  Patient Verbal Consent Given: Yes Education Handout Provided: Yes Muscles Treated: left upper trap Electrical Stimulation Performed: No Treatment Response/Outcome: improved soft tissue mobility and decreased tender point size and number                                                                                                                               PATIENT EDUCATION:  Education details: Educated patient on anatomy and physiology of current symptoms, prognosis, plan of care as well as initial self care strategies to promote recovery Person educated: Patient Education method: Explanation Education comprehension: verbalized understanding  HOME EXERCISE PROGRAM: Access Code: [email or web address] URL: [email or web address] Date: 11/08/2023 Prepared by: Lavinia Sharps  Exercises - Seated Upper Trapezius Stretch  - 1 x daily - 7 x weekly - 1 sets - 2-3 reps - 30 hold -  Seated Levator Scapulae Stretch  - 1 x daily - 7 x weekly - 1 sets - 2-3 reps - 30 hold - Seated Assisted Cervical Rotation with Towel  - 1 x daily - 7 x weekly - 1 sets - 8 reps - Seated Scapular Retraction  - 1 x daily - 7 x weekly - 1 sets - 10 reps - Standing Row with Anchored Resistance  - 1 x daily - 7 x weekly - 2 sets - 10 reps - Shoulder extension with resistance - Neutral  - 1 x daily - 7 x weekly - 2 sets - 10 reps ASSESSMENT:  CLINICAL IMPRESSION: Progressed HEP to include scapular resistance ex's with good response (well tolerated in both cervical and shoulder areas).  Review of ex's from last visit with therapist providing verbal cues for correct technique. The patient had numerous muscle twitches produced during dry needling which is a good prognostic indicator for benefit as it is associated with positive neuromotor and nervous system changes.    OBJECTIVE IMPAIRMENTS: decreased activity tolerance, decreased ROM, decreased strength, increased fascial restrictions, impaired perceived functional ability, and pain.   ACTIVITY LIMITATIONS: carrying, lifting, sleeping, and caring for others  PARTICIPATION LIMITATIONS: cleaning, laundry, driving, and occupation  PERSONAL FACTORS: Time since onset of injury/illness/exacerbation and 1 comorbidity: osteopenia  are also affecting patient's functional outcome.   REHAB POTENTIAL: Good  CLINICAL DECISION MAKING: Stable/uncomplicated  EVALUATION COMPLEXITY: Low   GOALS: Goals reviewed with patient? Yes  SHORT TERM GOALS: Target date: 11/15/2023   The patient will demonstrate knowledge of basic self care strategies and exercises to promote healing  Baseline:  Goal status: INITIAL  2.  The patient will report a 30% improvement in neck and headache pain levels with functional activities which are currently difficult including turning her head, going to sleep and first thing in the AM Baseline:  Goal status: INITIAL  3.  Improved  cervical rotation to 50 degrees needed for driving Baseline:  Goal status: INITIAL   LONG TERM GOALS: Target date: 12/13/2023   The patient will be independent in a safe self progression of a home exercise program to promote further recovery of function  Baseline:  Goal status: INITIAL  2.  The patient will report a 60% improvement in neck and headache pain levels with functional activities which are currently difficult including turning her head, going to sleep and first thing in the AM Baseline:  Goal status: INITIAL  3.  Cervical and scapular strength grossly 4+/5 needed for carrying shopping bags and luggage Baseline:  Goal status: INITIAL  4.  Cervical sidebending to 40 degrees and rotation to 55 degrees needed for driving Baseline:  Goal status: INITIAL  5.  Neck Disability Index improved to    24 % indicating improved function with less pain Baseline:  Goal status: INITIAL    PLAN:  PT FREQUENCY: 2x/week  PT DURATION: 8 weeks  PLANNED INTERVENTIONS: 97164- PT Re-evaluation, 97110-Therapeutic exercises, 97530- Therapeutic activity, 97112- Neuromuscular re-education, 97535- Self Care, 91478- Manual therapy, [phone]- Aquatic Therapy, [id number]- Electrical stimulation (unattended), [phone]- Electrical stimulation (manual), [id number]- Ultrasound, 57846- Traction (mechanical), [id number]- Ionotophoresis 4mg /ml Dexamethasone, Patient/Family education, Taping, Dry Needling, Joint mobilization, Spinal mobilization, Cryotherapy, and Moist heat  PLAN FOR NEXT SESSION: check STGs; assess response to DN left upper trap, cervical multifidi, levator scap and suboccipitals; add wall ex's, open books; dec frequency to 1x/week secondary to limited insurance visits approved  Lavinia Sharps, PT 11/08/23 1:57 PM Phone: [phone] Fax: [phone]

## 2023-11-13 ENCOUNTER — Encounter

## 2023-11-15 ENCOUNTER — Ambulatory Visit: Attending: Internal Medicine | Admitting: Physical Therapy

## 2023-11-15 ENCOUNTER — Other Ambulatory Visit: Payer: Self-pay | Admitting: Internal Medicine

## 2023-11-15 ENCOUNTER — Ambulatory Visit
Admission: RE | Admit: 2023-11-15 | Discharge: 2023-11-15 | Disposition: A | Discharge status: Home / Self Care | Source: Ambulatory Visit | Attending: Internal Medicine | Admitting: Internal Medicine

## 2023-11-15 DIAGNOSIS — M6281 Muscle weakness (generalized): Secondary | ICD-10-CM | POA: Insufficient documentation

## 2023-11-15 DIAGNOSIS — G4486 Cervicogenic headache: Secondary | ICD-10-CM | POA: Diagnosis not present

## 2023-11-15 DIAGNOSIS — M542 Cervicalgia: Secondary | ICD-10-CM | POA: Insufficient documentation

## 2023-11-15 DIAGNOSIS — M419 Scoliosis, unspecified: Secondary | ICD-10-CM | POA: Diagnosis not present

## 2023-11-15 DIAGNOSIS — M8580 Other specified disorders of bone density and structure, unspecified site: Secondary | ICD-10-CM

## 2023-11-15 NOTE — Therapy (Signed)
 OUTPATIENT PHYSICAL THERAPY CERVICAL PROGRESS NOTE    Patient Name: Megan Greer MRN: [id number] DOB:1956/07/21, 68 y.o., female Today's Date: 11/15/2023  END OF SESSION:  PT End of Session - 11/15/23 1017     Visit Number 4    Number of Visits 8    Date for PT Re-Evaluation 12/13/23    Authorization Type [employer] Medicare   8 visits   3/6- 5/1    PT Start Time 1015    PT Stop Time 1055    PT Time Calculation (min) 40 min    Activity Tolerance Patient tolerated treatment well             Past Medical History:  Diagnosis Date   Anxiety    Diverticulosis    GERD (gastroesophageal reflux disease)    Heart murmur    History of obesity    Hypercholesteremia    Hyperlipidemia    Insomnia    Mitral valve regurgitation    mild   Prediabetes    PVC (premature ventricular contraction)    Serrated adenoma of colon 2015   Sinusitis    Past Surgical History:  Procedure Laterality Date   BUNIONECTOMY     Right foot    DILATION AND CURETTAGE OF UTERUS     Patient Active Problem List   Diagnosis Date Noted   Abdominal pain, chronic, right lower quadrant 08/13/2013   Hypothyroidism 05/06/2013   Transaminitis 01/18/2012   Anxiety and depression 11/17/2011   Preventative health care 11/17/2011   Osteopenia 11/17/2011   Chronic insomnia 11/17/2011   Hyperlipidemia 11/17/2011   Gastroenteritis 11/17/2011    PCP: Eleanora Neighbor MD  REFERRING PROVIDER: Eleanora Neighbor MD  REFERRING DIAG: M54.2 Neck pain  THERAPY DIAG:  Neck pain; weakness; cervicogenic headache  Rationale for Evaluation and Treatment: Rehabilitation  ONSET DATE: 1 year ago  SUBJECTIVE:                                                                                                                                                                                                         SUBJECTIVE STATEMENT: I had trouble keeping the band over the top of the door.  40% better.   PERTINENT  HISTORY:   Had DN for hip in the past (hip pain has fully resolved) Osteopenia Knee meniscal debridement  PAIN:  Are you having pain? Yes NPRS scale: 3/10 Pain location: left neck, upper trap, headaches Pain orientation: Left  PAIN TYPE: aching Pain description: constant  Aggravating factors: takes Tylenol on nightly basis to get to sleep, hurts  in the AM; turning to the left > right (limited); sidebending right/left Relieving factors: Tylenol, Voltaren, heat  PRECAUTIONS: None   WEIGHT BEARING RESTRICTIONS: No  FALLS:  Has patient fallen in last 6 months? No LIVING ENVIRONMENT: Lives with: lives with their spouse Lives in: House/apartment Stairs: ranch home but some steps to higher room    OCCUPATION: works 2x/week  (nursing)   PLOF: Independent  PATIENT GOALS: lessen pain; engage in more activities; better quality of life  NEXT MD VISIT: as needed  OBJECTIVE:  Note: Objective measures were completed at Evaluation unless otherwise noted.  DIAGNOSTIC FINDINGS:  Had U/S on neck slight Dr. Oda Kilts did x-ray of shoulder saw OA "might need replacement in future"  Has been referred to endocrinologist for osteoporosis (appt next week) ASSESSMENT: Feb 2025: The BMD measured at AP Spine L1-L3 is 0.909 g/cm2 with a T-score of -2.2. This patient is considered osteopenic/low bone mass according to World Health Organization [facility]) criteria.   The quality of the exam is good. L4 was excluded due to degenerative changes.   Site Region Measured Date Measured Age YA BMD Significant CHANGE T-score AP Spine  L1-L3      10/02/2023    68.5         -2.2    0.909 g/cm2   DualFemur Neck Right 10/02/2023    68.5         -1.3    0.860 g/cm2   DualFemur Total Mean 10/02/2023    68.5         -0.8    0.906 g/cm2   World Health Organization [facility]) criteria for post-menopausal, Caucasian Women: Normal       T-score at or above -1 SD Osteopenia   T-score between -1 and -2.5  SD Osteoporosis T-score at or below -2.5 SD  PATIENT SURVEYS:  NDI 32%  COGNITION: Overall cognitive status: Within functional limits for tasks assessed  POSTURE: No Significant postural limitations  PALPATION: Tender points in left upper trap and levator scap muscles; fascial restriction suboccipitals   CERVICAL ROM:   Active ROM A/PROM (deg) eval 4/3  Flexion 45   Extension 50   Right lateral flexion 24 34  Left lateral flexion 34 27  Right rotation 43 50  Left rotation 18 27   (Blank rows = not tested)  Deep cervical flexor and extensors strength 4/5  UPPER EXTREMITY ROM: bil shoulder flexion and abduction 155 degrees, internal rotation T8  UPPER EXTREMITY MMT: glenohumeral strength grossly 4+/5. Scapular depressors and retractors 4-/5  CERVICAL SPECIAL TESTS: + distraction  TREATMENT DATE: 11/15/23 Cervical ROM  Review of green band resistance ex's (cues on anchoring over the top of the door and standing close to keep band from sliding off)  Seated upper trap stretch x 30 sec hold  Seated levator scap stretch x 30 sec hold  Cervical SNAG 8x right/left (correction of technique top arm at eyebrow level) Manual therapy: soft tissue mobilization to left upper trap, levator scap, cervical paraspinals, left suboccipitals Trigger Point Dry Needling Subsequent Treatment: Instructions provided previously at initial dry needling treatment.  Patient Verbal Consent Given: Yes Education Handout Provided: Previously Provided Muscles Treated: left only: upper trap, levator scap, cervical multifidi, suboccipitals  Electrical Stimulation Performed: No Treatment Response/Outcome: improved soft tissue mobility and decreased tender point size/number  TREATMENT DATE: 11/08/23 Red band rows (easy) increased to green band resistance 2x 10 (added to HEP) Red band bil shoulder extension (easy) increased to green band resistance 2x10 (added  to HEP) Seated upper trap stretch 3x 30 sec hold   Seated levator scap stretch 3x 30 sec hold  Cervical SNAG 8x right/left (added to HEP) Manual therapy: soft tissue mobilization to left upper trap, levator scap, cervical paraspinals, left suboccipitals Trigger Point Dry Needling Subsequent Treatment: Instructions provided previously at initial dry needling treatment.  Patient Verbal Consent Given: Yes Education Handout Provided: Previously Provided Muscles Treated: left only: upper trap, levator scap, cervical multifidi, suboccipitals 2-3 spots each Electrical Stimulation Performed: No Treatment Response/Outcome: improved soft tissue mobility and decreased tender point size/numbert  TREATMENT DATE: 10/31/23 Seated upper trap stretch 3x 30 sec hold (added to HEP) Seated levator scap stretch 3x 30 sec hold (added to HEP) Scap retract 10x (added to HEP) Cervical SNAG 8x right/left (added to HEP) Manual therapy: soft tissue mobilization to left upper trap, levator scap, cervical paraspinals, left suboccipitals Trigger Point Dry Needling Subsequent Treatment: Instructions provided previously at initial dry needling treatment.  Patient Verbal Consent Given: Yes Education Handout Provided: Previously Provided Muscles Treated: left only: upper trap, levator scap, cervical multifidi, suboccipitals 2-3 spots each Electrical Stimulation Performed: No Treatment Response/Outcome: improved soft tissue mobility and decreased tender point size/number  TREATMENT DATE: 10/18/23 evaluation     Trigger Point Dry Needling Initial Treatment: Pt instructed on Dry Needling rational, procedures, and possible side effects. Pt instructed to expect mild to moderate muscle soreness later in the day and/or into the next day.  Pt instructed in methods to reduce muscle soreness. Patient verbalized understanding of these instructions and education.  Patient Verbal Consent Given: Yes Education Handout Provided: Yes Muscles Treated: left upper trap Electrical  Stimulation Performed: No Treatment Response/Outcome: improved soft tissue mobility and decreased tender point size and number                                                                                                                               PATIENT EDUCATION:  Education details: Educated patient on anatomy and physiology of current symptoms, prognosis, plan of care as well as initial self care strategies to promote recovery Person educated: Patient Education method: Explanation Education comprehension: verbalized understanding  HOME EXERCISE PROGRAM: Access Code: [email or web address] URL: [email or web address] Date: 11/08/2023 Prepared by: Lavinia Sharps  Exercises - Seated Upper Trapezius Stretch  - 1 x daily - 7 x weekly - 1 sets - 2-3 reps - 30 hold - Seated Levator Scapulae Stretch  - 1 x daily - 7 x weekly - 1 sets - 2-3 reps - 30 hold - Seated Assisted Cervical Rotation with Towel  - 1 x daily - 7 x weekly - 1 sets - 8 reps - Seated Scapular Retraction  - 1 x daily - 7 x weekly - 1 sets - 10 reps - Standing Row with Anchored Resistance  - 1 x daily - 7 x weekly - 2 sets - 10 reps - Shoulder extension with resistance - Neutral  - 1 x daily -  7 x weekly - 2 sets - 10 reps ASSESSMENT:  CLINICAL IMPRESSION: Improving cervical sidebending and rotation ROM since start of care but still some asymmetry between right and left.  Corrected technique with ex's to optimize benefit.  Therapist monitoring response throughout session and adjusting treatment as needed.    OBJECTIVE IMPAIRMENTS: decreased activity tolerance, decreased ROM, decreased strength, increased fascial restrictions, impaired perceived functional ability, and pain.   ACTIVITY LIMITATIONS: carrying, lifting, sleeping, and caring for others  PARTICIPATION LIMITATIONS: cleaning, laundry, driving, and occupation  PERSONAL FACTORS: Time since onset of injury/illness/exacerbation and 1 comorbidity: osteopenia   are also affecting patient's functional outcome.   REHAB POTENTIAL: Good  CLINICAL DECISION MAKING: Stable/uncomplicated  EVALUATION COMPLEXITY: Low   GOALS: Goals reviewed with patient? Yes  SHORT TERM GOALS: Target date: 11/15/2023   The patient will demonstrate knowledge of basic self care strategies and exercises to promote healing  Baseline:  Goal status: met 4/3  2.  The patient will report a 30% improvement in neck and headache pain levels with functional activities which are currently difficult including turning her head, going to sleep and first thing in the AM Baseline:  Goal status: met 4/3 3.  Improved cervical rotation to 50 degrees needed for driving Baseline:  Goal status:     LONG TERM GOALS: Target date: 12/13/2023   The patient will be independent in a safe self progression of a home exercise program to promote further recovery of function  Baseline:  Goal status: INITIAL  2.  The patient will report a 60% improvement in neck and headache pain levels with functional activities which are currently difficult including turning her head, going to sleep and first thing in the AM Baseline:  Goal status: INITIAL  3.  Cervical and scapular strength grossly 4+/5 needed for carrying shopping bags and luggage Baseline:  Goal status: INITIAL  4.  Cervical sidebending to 40 degrees and rotation to 55 degrees needed for driving Baseline:  Goal status: INITIAL  5.  Neck Disability Index improved to    24 % indicating improved function with less pain Baseline:  Goal status: INITIAL    PLAN:  PT FREQUENCY: 2x/week  PT DURATION: 8 weeks  PLANNED INTERVENTIONS: 95621- PT Re-evaluation, 97110-Therapeutic exercises, 97530- Therapeutic activity, 97112- Neuromuscular re-education, 97535- Self Care, 30865- Manual therapy, [phone]- Aquatic Therapy, [phone]- Electrical stimulation (unattended), [phone]- Electrical stimulation (manual), [id number]- Ultrasound, 44010- Traction  (mechanical), [id number]- Ionotophoresis 4mg /ml Dexamethasone, Patient/Family education, Taping, Dry Needling, Joint mobilization, Spinal mobilization, Cryotherapy, and Moist heat  PLAN FOR NEXT SESSION: pt concerned about small [place] start extensor strengthening- supine scapular series or prone; assess response to DN left upper trap, cervical multifidi, levator scap and suboccipitals; add wall ex's, open books; dec frequency to 1x/week secondary to limited insurance visits approved  Lavinia Sharps, PT 11/15/23 10:55 AM Phone: [phone] Fax: [phone]

## 2023-11-17 ENCOUNTER — Other Ambulatory Visit (HOSPITAL_COMMUNITY): Payer: Self-pay

## 2023-11-20 ENCOUNTER — Other Ambulatory Visit (HOSPITAL_COMMUNITY): Payer: Self-pay

## 2023-11-20 ENCOUNTER — Encounter: Admitting: Physical Therapy

## 2023-11-20 MED ORDER — FLUTICASONE PROPIONATE 50 MCG/ACT NA SUSP
2.0000 | Freq: Every day | NASAL | 11 refills | Status: AC | PRN
  Filled 2023-11-20: qty 16, 30d supply, fill #0
  Filled 2024-04-25: qty 16, 30d supply, fill #1
  Filled 2024-08-11: qty 16, 30d supply, fill #2

## 2023-11-22 ENCOUNTER — Ambulatory Visit: Admitting: Physical Therapy

## 2023-11-22 DIAGNOSIS — G4486 Cervicogenic headache: Secondary | ICD-10-CM

## 2023-11-22 DIAGNOSIS — M542 Cervicalgia: Secondary | ICD-10-CM

## 2023-11-22 DIAGNOSIS — M6281 Muscle weakness (generalized): Secondary | ICD-10-CM

## 2023-11-22 NOTE — Therapy (Signed)
 OUTPATIENT PHYSICAL THERAPY CERVICAL PROGRESS NOTE    Patient Name: Megan Greer MRN: [id number] DOB:03/30/1956, 68 y.o., female Today's Date: 11/22/2023  END OF SESSION:  PT End of Session - 11/22/23 1103     Visit Number 5    Number of Visits 8    Date for PT Re-Evaluation 12/13/23    Authorization Type [employer]   8 visits   3/6- 5/1    PT Start Time 1102    PT Stop Time 1144    PT Time Calculation (min) 42 min    Activity Tolerance Patient tolerated treatment well             Past Medical History:  Diagnosis Date   Anxiety    Diverticulosis    GERD (gastroesophageal reflux disease)    Heart murmur    History of obesity    Hypercholesteremia    Hyperlipidemia    Insomnia    Mitral valve regurgitation    mild   Prediabetes    PVC (premature ventricular contraction)    Serrated adenoma of colon 2015   Sinusitis    Past Surgical History:  Procedure Laterality Date   BUNIONECTOMY     Right foot    DILATION AND CURETTAGE OF UTERUS     Patient Active Problem List   Diagnosis Date Noted   Abdominal pain, chronic, right lower quadrant 08/13/2013   Hypothyroidism 05/06/2013   Transaminitis 01/18/2012   Anxiety and depression 11/17/2011   Preventative health care 11/17/2011   Osteopenia 11/17/2011   Chronic insomnia 11/17/2011   Hyperlipidemia 11/17/2011   Gastroenteritis 11/17/2011    PCP: Eleanora Neighbor MD  REFERRING PROVIDER: Eleanora Neighbor MD  REFERRING DIAG: M54.2 Neck pain  THERAPY DIAG:  Neck pain; weakness; cervicogenic headache  Rationale for Evaluation and Treatment: Rehabilitation  ONSET DATE: 1 year ago  SUBJECTIVE:                                                                                                                                                                                                         SUBJECTIVE STATEMENT: I can turn my head better.  Mild soreness from the DN but it's helping me a lot.    PERTINENT HISTORY:   Had DN for hip in the past (hip pain has fully resolved) Osteopenia Knee meniscal debridement  PAIN:  Are you having pain? Yes NPRS scale: 3/10 Pain location: left neck, upper trap, headaches Pain orientation: Left  PAIN TYPE: aching Pain description: constant  Aggravating factors: takes Tylenol on nightly basis to get  to sleep, hurts in the AM; turning to the left > right (limited); sidebending right/left Relieving factors: Tylenol, Voltaren, heat  PRECAUTIONS: None   WEIGHT BEARING RESTRICTIONS: No  FALLS:  Has patient fallen in last 6 months? No LIVING ENVIRONMENT: Lives with: lives with their spouse Lives in: House/apartment Stairs: ranch home but some steps to higher room    OCCUPATION: works 2x/week  (nursing)   PLOF: Independent  PATIENT GOALS: lessen pain; engage in more activities; better quality of life  NEXT MD VISIT: as needed  OBJECTIVE:  Note: Objective measures were completed at Evaluation unless otherwise noted.  DIAGNOSTIC FINDINGS:  Had U/S on neck slight Dr. Oda Kilts did x-ray of shoulder saw OA "might need replacement in future"  Has been referred to endocrinologist for osteoporosis (appt next week) ASSESSMENT: Feb 2025: The BMD measured at [facility] L1-L3 is 0.909 g/cm2 with a T-score of -2.2. This patient is considered osteopenic/low bone mass according to World Health Organization [facility]) criteria.   The quality of the exam is good. L4 was excluded due to degenerative changes.   Site Region Measured Date Measured Age YA BMD Significant CHANGE T-score [facility]  L1-L3      10[birth year]02/20[age]         -2.1    0.909 g/cm2   DualFemur Neck Right 10/02/2022    68.5         -1.3    0.860 g/cm2   DualFemur Total Mean 10[age]02/2022    68.5         -0.7    0.906 g/cm2   World Health Organization [facility] criteria for post-menopausal, Caucasian Women: Normal       T-score at or above -1 SD Osteopenia   T-score between -1 and -2.5  SD Osteoporosis T-score at or below -2.5 SD  PATIENT SURVEYS:  NDI 32%  COGNITION: Overall cognitive status: Within functional limits for tasks assessed  POSTURE: No Significant postural limitations  PALPATION: Tender points in left upper trap and levator scap muscles; fascial restriction suboccipitals   CERVICAL ROM:   Active ROM A/PROM (deg) eval 4/3  Flexion 45   Extension 50   Right lateral flexion 24 34  Left lateral flexion 34 27  Right rotation 43 50  Left rotation 18 27   (Blank rows = not tested)  Deep cervical flexor and extensors strength 4/5  UPPER EXTREMITY ROM: bil shoulder flexion and abduction 155 degrees, internal rotation T8  UPPER EXTREMITY MMT: glenohumeral strength grossly 4+/5. Scapular depressors and retractors 4-/5  CERVICAL SPECIAL TESTS: + distraction  TREATMENT DATE: 11/22/23 Review of HEP Prone head lifts: arms in I position, T position, W position and Y position 5x each  (added to HEP) Manual therapy: soft tissue mobilization to left upper trap, levator scap, cervical paraspinals, left suboccipitals Trigger Point Dry Needling Subsequent Treatment: Instructions provided previously at initial dry needling treatment.  Patient Verbal Consent Given: Yes Education Handout Provided: Previously Provided Muscles Treated: left only: upper trap, levator scap, cervical multifidi, suboccipitals  Electrical Stimulation Performed: No Treatment Response/Outcome: improved soft tissue mobility and decreased tender point size/number Heat 2 min  TREATMENT DATE: 11/15/23 Cervical ROM  Review of green band resistance ex's (cues on anchoring over the top of the door and standing close to keep band from sliding off)  Seated upper trap stretch x 30 sec hold  Seated levator scap stretch x 30 sec hold  Cervical SNAG 8x right/left (correction of technique top arm at  eyebrow level) Manual therapy: soft tissue mobilization to left upper trap, levator scap, cervical  paraspinals, left suboccipitals Trigger Point Dry Needling Subsequent Treatment: Instructions provided previously at initial dry needling treatment.  Patient Verbal Consent Given: Yes Education Handout Provided: Previously Provided Muscles Treated: left only: upper trap, levator scap, cervical multifidi, suboccipitals  Electrical Stimulation Performed: No Treatment Response/Outcome: improved soft tissue mobility and decreased tender point size/number  TREATMENT DATE: 11/08/23 Red band rows (easy) increased to green band resistance 2x 10 (added to HEP) Red band bil shoulder extension (easy) increased to green band resistance 2x10 (added to HEP) Seated upper trap stretch 3x 30 sec hold  Seated levator scap stretch 3x 30 sec hold  Cervical SNAG 8x right/left (added to HEP) Manual therapy: soft tissue mobilization to left upper trap, levator scap, cervical paraspinals, left suboccipitals Trigger Point Dry Needling Subsequent Treatment: Instructions provided previously at initial dry needling treatment.  Patient Verbal Consent Given: Yes Education Handout Provided: Previously Provided Muscles Treated: left only: upper trap, levator scap, cervical multifidi, suboccipitals 2-3 spots each Electrical Stimulation Performed: No Treatment Response/Outcome: improved soft tissue mobility and decreased tender point size/numbert  TREATMENT DATE: 10/31/23 Seated upper trap stretch 3x 30 sec hold (added to HEP) Seated levator scap stretch 3x 30 sec hold (added to HEP) Scap retract 10x (added to HEP) Cervical SNAG 8x right/left (added to HEP) Manual therapy: soft tissue mobilization to left upper trap, levator scap, cervical paraspinals, left suboccipitals Trigger Point Dry Needling Subsequent Treatment: Instructions provided previously at initial dry needling treatment.  Patient Verbal Consent Given: Yes Education Handout Provided: Previously Provided Muscles Treated: left only: upper trap, levator  scap, cervical multifidi, suboccipitals 2-3 spots each Electrical Stimulation Performed: No Treatment Response/Outcome: improved soft tissue mobility and decreased tender point size/number    PATIENT EDUCATION:  Education details: Educated patient on anatomy and physiology of current symptoms, prognosis, plan of care as well as initial self care strategies to promote recovery Person educated: Patient Education method: Explanation Education comprehension: verbalized understanding  HOME EXERCISE PROGRAM: Access Code: [email or web address] URL: [email or web address] Date: 11/22/2023 Prepared by: Lavinia Sharps  Exercises - Seated Upper Trapezius Stretch  - 1 x daily - 7 x weekly - 1 sets - 2-3 reps - 30 hold - Seated Levator Scapulae Stretch  - 1 x daily - 7 x weekly - 1 sets - 2-3 reps - 30 hold - Seated Assisted Cervical Rotation with Towel  - 1 x daily - 7 x weekly - 1 sets - 8 reps - Seated Scapular Retraction  - 1 x daily - 7 x weekly - 1 sets - 10 reps - Standing Row with Anchored Resistance  - 1 x daily - 7 x weekly - 2 sets - 10 reps - Shoulder extension with resistance - Neutral  - 1 x daily - 7 x weekly - 2 sets - 10 reps - Prone Scapular Slide with Shoulder Extension  - 1 x daily - 7 x weekly - 1 sets - 5 reps - Prone Shoulder Horizontal Abduction with Thumbs Up  - 1 x daily - 7 x weekly - 1 sets - 5 reps - Prone W Scapular Retraction  - 1 x daily - 7 x weekly - 1 sets - 5 reps - Prone Single Arm Shoulder Y  - 1 x daily - 7 x weekly - 1 sets - 5 reps ASSESSMENT:  CLINICAL IMPRESSION: Therapist progressing and updating HEP for further postural strengthening of upper back musculature.  She responds  well to DN to address numerous tender points in left cervical paraspinals, upper traps, levator scap and suboccipitals.  The patient had numerous muscle twitches produced during dry needling which is a good prognostic indicator for benefit as it is associated with positive neuromotor  and nervous system changes. Therapist monitoring response to all interventions and modifying treatment accordingly.       OBJECTIVE IMPAIRMENTS: decreased activity tolerance, decreased ROM, decreased strength, increased fascial restrictions, impaired perceived functional ability, and pain.   ACTIVITY LIMITATIONS: carrying, lifting, sleeping, and caring for others  PARTICIPATION LIMITATIONS: cleaning, laundry, driving, and occupation  PERSONAL FACTORS: Time since onset of injury/illness/exacerbation and 1 comorbidity: osteopenia  are also affecting patient's functional outcome.   REHAB POTENTIAL: Good  CLINICAL DECISION MAKING: Stable/uncomplicated  EVALUATION COMPLEXITY: Low   GOALS: Goals reviewed with patient? Yes  SHORT TERM GOALS: Target date: 11/15/2023   The patient will demonstrate knowledge of basic self care strategies and exercises to promote healing  Baseline:  Goal status: met 4/3  2.  The patient will report a 30% improvement in neck and headache pain levels with functional activities which are currently difficult including turning her head, going to sleep and first thing in the AM Baseline:  Goal status: met 4/3 3.  Improved cervical rotation to 50 degrees needed for driving Baseline:  Goal status:  partially met 4/3   LONG TERM GOALS: Target date: 12/13/2023   The patient will be independent in a safe self progression of a home exercise program to promote further recovery of function  Baseline:  Goal status: INITIAL  2.  The patient will report a 60% improvement in neck and headache pain levels with functional activities which are currently difficult including turning her head, going to sleep and first thing in the AM Baseline:  Goal status: INITIAL  3.  Cervical and scapular strength grossly 4+/5 needed for carrying shopping bags and luggage Baseline:  Goal status: INITIAL  4.  Cervical sidebending to 40 degrees and rotation to 55 degrees needed for  driving Baseline:  Goal status: INITIAL  5.  Neck Disability Index improved to    24 % indicating improved function with less pain Baseline:  Goal status: INITIAL    PLAN:  PT FREQUENCY: 2x/week  PT DURATION: 8 weeks  PLANNED INTERVENTIONS: 40981- PT Re-evaluation, 97110-Therapeutic exercises, 97530- Therapeutic activity, 97112- Neuromuscular re-education, 97535- Self Care, [id number]- Manual therapy, [phone]- Aquatic Therapy, [phone]- Electrical stimulation (unattended), [phone]- Electrical stimulation (manual), [id number]- Ultrasound, 69629- Traction (mechanical), [id number]- Ionotophoresis 4mg /ml Dexamethasone, Patient/Family education, Taping, Dry Needling, Joint mobilization, Spinal mobilization, Cryotherapy, and Moist heat  PLAN FOR NEXT SESSION:  review of scapular series in prone; assess response to DN left upper trap, cervical multifidi, levator scap and suboccipitals; add wall ex's, open books; dec frequency to 1x/week secondary to limited insurance visits approved  Lavinia Sharps, PT 11/22/23 11:38 AM Phone: [phone] Fax: [phone]

## 2023-11-27 ENCOUNTER — Encounter: Admitting: Physical Therapy

## 2023-11-29 ENCOUNTER — Ambulatory Visit: Admitting: Physical Therapy

## 2023-12-10 ENCOUNTER — Other Ambulatory Visit (HOSPITAL_COMMUNITY): Payer: Self-pay

## 2023-12-11 ENCOUNTER — Encounter: Admitting: Physical Therapy

## 2023-12-13 ENCOUNTER — Ambulatory Visit: Attending: Internal Medicine | Admitting: Physical Therapy

## 2023-12-13 ENCOUNTER — Other Ambulatory Visit (HOSPITAL_COMMUNITY): Payer: Self-pay

## 2023-12-13 DIAGNOSIS — M542 Cervicalgia: Secondary | ICD-10-CM | POA: Diagnosis not present

## 2023-12-13 DIAGNOSIS — M6281 Muscle weakness (generalized): Secondary | ICD-10-CM | POA: Insufficient documentation

## 2023-12-13 DIAGNOSIS — G4486 Cervicogenic headache: Secondary | ICD-10-CM | POA: Insufficient documentation

## 2023-12-13 MED ORDER — BUPROPION HCL ER (XL) 300 MG PO TB24
300.0000 mg | ORAL_TABLET | Freq: Every morning | ORAL | 3 refills | Status: AC
  Filled 2024-01-29: qty 90, 90d supply, fill #0
  Filled 2024-04-28: qty 90, 90d supply, fill #1
  Filled 2024-09-17: qty 90, 90d supply, fill #2

## 2023-12-13 MED ORDER — TRAZODONE HCL 50 MG PO TABS
50.0000 mg | ORAL_TABLET | Freq: Every evening | ORAL | 3 refills | Status: AC | PRN
  Filled 2024-04-18: qty 270, 90d supply, fill #0
  Filled 2024-08-11: qty 270, 90d supply, fill #1

## 2023-12-13 NOTE — Therapy (Signed)
 OUTPATIENT PHYSICAL THERAPY CERVICAL PROGRESS NOTE/RECERTIFICATION   Patient Name: Megan Greer MRN: [id number] DOB:Nov 18, 1955, 68 y.o., female Today's Date: 12/13/2023  END OF SESSION:  PT End of Session - 12/13/23 1147     Visit Number 6    Number of Visits 8    Date for PT Re-Evaluation 02/07/24    Authorization Type [employer]   8 visits   3/6- 5/1, will submit for further visits    PT Start Time 1145    PT Stop Time 1230    PT Time Calculation (min) 45 min    Activity Tolerance Patient tolerated treatment well             Past Medical History:  Diagnosis Date   Anxiety    Diverticulosis    GERD (gastroesophageal reflux disease)    Heart murmur    History of obesity    Hypercholesteremia    Hyperlipidemia    Insomnia    Mitral valve regurgitation    mild   Prediabetes    PVC (premature ventricular contraction)    Serrated adenoma of colon 2015   Sinusitis    Past Surgical History:  Procedure Laterality Date   BUNIONECTOMY     Right foot    DILATION AND CURETTAGE OF UTERUS     Patient Active Problem List   Diagnosis Date Noted   Abdominal pain, chronic, right lower quadrant 08/13/2013   Hypothyroidism 05/06/2013   Transaminitis 01/18/2012   Anxiety and depression 11/17/2011   Preventative health care 11/17/2011   Osteopenia 11/17/2011   Chronic insomnia 11/17/2011   Hyperlipidemia 11/17/2011   Gastroenteritis 11/17/2011    PCP: Charlene Conners MD  REFERRING PROVIDER: Charlene Conners MD  REFERRING DIAG: M54.2 Neck pain  THERAPY DIAG:  Neck pain; weakness; cervicogenic headache  Rationale for Evaluation and Treatment: Rehabilitation  ONSET DATE: 1 year ago  SUBJECTIVE:                                                                                                                                                                                                         SUBJECTIVE STATEMENT: I was great before my trip and while I  was there.  But then I did some gardening and I was in agony.  Hard to even turn over in bed.  Hard to turn my head.  Some relief with Biofreeze and heat which did help.    PERTINENT HISTORY:   Had DN for hip in the past (hip pain has fully resolved) Osteopenia Knee meniscal debridement  PAIN:  Are  you having pain? Yes NPRS scale: 7/10 Pain location: left neck, upper trap, headaches Pain orientation: Left  PAIN TYPE: aching Pain description: constant  Aggravating factors: takes Tylenol  on nightly basis to get to sleep, hurts in the AM; turning to the left > right (limited); sidebending right/left Relieving factors: Tylenol , Voltaren, heat  PRECAUTIONS: None   WEIGHT BEARING RESTRICTIONS: No  FALLS:  Has patient fallen in last 6 months? No LIVING ENVIRONMENT: Lives with: lives with their spouse Lives in: House/apartment Stairs: ranch home but some steps to higher room    OCCUPATION: works 2x/week  (nursing)   PLOF: Independent  PATIENT GOALS: lessen pain; engage in more activities; better quality of life  NEXT MD VISIT: as needed  OBJECTIVE:  Note: Objective measures were completed at Evaluation unless otherwise noted.  DIAGNOSTIC FINDINGS:  Had U/S on neck slight Dr. Laquita Plant did x-ray of shoulder saw OA "might need replacement in future"  Has been referred to endocrinologist for osteoporosis (appt next week) ASSESSMENT: Feb 2025: The BMD measured at [facility] L1-L3 is 0.909 g/cm2 with a T-score of -2.2. This patient is considered osteopenic/low bone mass according to World Health Organization [facility]) criteria.   The quality of the exam is good. L4 was excluded due to degenerative changes.   Site Region Measured Date Measured Age YA BMD Significant CHANGE T-score [facility]  L1-L3      10/02/20[age].5         -2.2    0.909 g/cm2   DualFemur Neck Right 10/02/2023    68.5         -1.3    0.860 g/cm2   DualFemur Total Mean 10[age]02/2023    68.5         -0.8    0.906  g/cm2   World Health Organization [facility]) criteria for post-menopausal, Caucasian Women: Normal       T-score at or above -1 SD Osteopenia   T-score between -1 and -2.5 SD Osteoporosis T-score at or below -2.5 SD  PATIENT SURVEYS:  NDI 32% 12/13/23:  50%  COGNITION: Overall cognitive status: Within functional limits for tasks assessed  POSTURE: No Significant postural limitations  PALPATION: Tender points in left upper trap and levator scap muscles; fascial restriction suboccipitals   CERVICAL ROM:   Active ROM A/PROM (deg) eval 4/3 5/1  Flexion 45  47  Extension 50  40  Right lateral flexion 24 34 25  Left lateral flexion 34 27 20  Right rotation 43 50 45  Left rotation 18 27 22    (Blank rows = not tested)  Deep cervical flexor and extensors strength 4/5  UPPER EXTREMITY ROM: bil shoulder flexion and abduction 155 degrees, internal rotation T8  UPPER EXTREMITY MMT: glenohumeral strength grossly 4+/5. Scapular depressors and retractors 4-/5  CERVICAL SPECIAL TESTS: + distraction   TREATMENT DATE: 12/13/23 Review of HEP emphasis on cervical SNAG with towel, upper trap and levator scap stretch  return to HEP and consistency Cervical ROM  NDI Manual therapy: soft tissue mobilization to left upper trap, levator scap, cervical paraspinals, left suboccipitals Trigger Point Dry Needling Subsequent Treatment: Instructions provided previously at initial dry needling treatment.  Patient Verbal Consent Given: Yes Education Handout Provided: Previously Provided Muscles Treated: left only: upper trap, levator scap, cervical multifidi, suboccipitals  Electrical Stimulation Performed: No Treatment Response/Outcome: improved soft tissue mobility and decreased tender point size/number Heat 2 min  TREATMENT DATE: 11/22/23 Review of HEP Prone head lifts: arms in I  position, T position, W position and Y position 5x each  (added to HEP) Manual therapy: soft tissue mobilization to left  upper trap, levator scap, cervical paraspinals, left suboccipitals Trigger Point Dry Needling Subsequent Treatment: Instructions provided previously at initial dry needling treatment.  Patient Verbal Consent Given: Yes Education Handout Provided: Previously Provided Muscles Treated: left only: upper trap, levator scap, cervical multifidi, suboccipitals  Electrical Stimulation Performed: No Treatment Response/Outcome: improved soft tissue mobility and decreased tender point size/number Heat 2 min  TREATMENT DATE: 11/15/23 Cervical ROM  Review of green band resistance ex's (cues on anchoring over the top of the door and standing close to keep band from sliding off)  Seated upper trap stretch x 30 sec hold  Seated levator scap stretch x 30 sec hold  Cervical SNAG 8x right/left (correction of technique top arm at eyebrow level) Manual therapy: soft tissue mobilization to left upper trap, levator scap, cervical paraspinals, left suboccipitals Trigger Point Dry Needling Subsequent Treatment: Instructions provided previously at initial dry needling treatment.  Patient Verbal Consent Given: Yes Education Handout Provided: Previously Provided Muscles Treated: left only: upper trap, levator scap, cervical multifidi, suboccipitals  Electrical Stimulation Performed: No Treatment Response/Outcome: improved soft tissue mobility and decreased tender point size/number  TREATMENT DATE: 11/08/23 Red band rows (easy) increased to green band resistance 2x 10 (added to HEP) Red band bil shoulder extension (easy) increased to green band resistance 2x10 (added to HEP) Seated upper trap stretch 3x 30 sec hold  Seated levator scap stretch 3x 30 sec hold  Cervical SNAG 8x right/left (added to HEP) Manual therapy: soft tissue mobilization to left upper trap, levator scap, cervical paraspinals, left suboccipitals Trigger Point Dry Needling Subsequent Treatment: Instructions provided previously at initial dry  needling treatment.  Patient Verbal Consent Given: Yes Education Handout Provided: Previously Provided Muscles Treated: left only: upper trap, levator scap, cervical multifidi, suboccipitals 2-3 spots each Electrical Stimulation Performed: No Treatment Response/Outcome: improved soft tissue mobility and decreased tender point size/numbert   PATIENT EDUCATION:  Education details: Educated patient on anatomy and physiology of current symptoms, prognosis, plan of care as well as initial self care strategies to promote recovery Person educated: Patient Education method: Explanation Education comprehension: verbalized understanding  HOME EXERCISE PROGRAM: Access Code: [email or web address] URL: [email or web address] Date: 11/22/2023 Prepared by: Darien Eden  Exercises - Seated Upper Trapezius Stretch  - 1 x daily - 7 x weekly - 1 sets - 2-3 reps - 30 hold - Seated Levator Scapulae Stretch  - 1 x daily - 7 x weekly - 1 sets - 2-3 reps - 30 hold - Seated Assisted Cervical Rotation with Towel  - 1 x daily - 7 x weekly - 1 sets - 8 reps - Seated Scapular Retraction  - 1 x daily - 7 x weekly - 1 sets - 10 reps - Standing Row with Anchored Resistance  - 1 x daily - 7 x weekly - 2 sets - 10 reps - Shoulder extension with resistance - Neutral  - 1 x daily - 7 x weekly - 2 sets - 10 reps - Prone Scapular Slide with Shoulder Extension  - 1 x daily - 7 x weekly - 1 sets - 5 reps - Prone Shoulder Horizontal Abduction with Thumbs Up  - 1 x daily - 7 x weekly - 1 sets - 5 reps - Prone W Scapular Retraction  - 1 x daily - 7 x weekly - 1 sets - 5 reps - Prone Single Arm Shoulder Y  -  1 x daily - 7 x weekly - 1 sets - 5 reps ASSESSMENT:  CLINICAL IMPRESSION: Timikia was progressing well with PT with improvements in pain reduction, ROM and function.  While out of town, she was unable to be consistent with her exercises and after returning home she did some gardening which caused a flare up to left  cervical musculature.  As a result her functional outcome score has worsened as well more limitations with cervical ROM.  The patient would benefit from a continuation of skilled PT for a further progression of strengthening and functional mobility.  Will continue to update and promote independence in a HEP needed for a return to the highest functional level possible with ADLs.         OBJECTIVE IMPAIRMENTS: decreased activity tolerance, decreased ROM, decreased strength, increased fascial restrictions, impaired perceived functional ability, and pain.   ACTIVITY LIMITATIONS: carrying, lifting, sleeping, and caring for others  PARTICIPATION LIMITATIONS: cleaning, laundry, driving, and occupation  PERSONAL FACTORS: Time since onset of injury/illness/exacerbation and 1 comorbidity: osteopenia  are also affecting patient's functional outcome.   REHAB POTENTIAL: Good  CLINICAL DECISION MAKING: Stable/uncomplicated  EVALUATION COMPLEXITY: Low   GOALS: Goals reviewed with patient? Yes  SHORT TERM GOALS: Target date: 11/15/2023   The patient will demonstrate knowledge of basic self care strategies and exercises to promote healing  Baseline:  Goal status: met 4/3  2.  The patient will report a 30% improvement in neck and headache pain levels with functional activities which are currently difficult including turning her head, going to sleep and first thing in the AM Baseline:  Goal status: met 4/3 3.  Improved cervical rotation to 50 degrees needed for driving Baseline:  Goal status:  partially met 4/3   LONG TERM GOALS: Target date: 02/07/2024    The patient will be independent in a safe self progression of a home exercise program to promote further recovery of function  Baseline:  Goal status: INITIAL  2.  The patient will report a 60% improvement in neck and headache pain levels with functional activities which are currently difficult including turning her head, going to sleep  and first thing in the AM Baseline:  Goal status: INITIAL  3.  Cervical and scapular strength grossly 4+/5 needed for carrying shopping bags and luggage Baseline:  Goal status: INITIAL  4.  Cervical sidebending to 40 degrees and rotation to 55 degrees needed for driving Baseline:  Goal status: INITIAL  5.  Neck Disability Index improved to    24 % indicating improved function with less pain Baseline:  Goal status: INITIAL    PLAN:  PT FREQUENCY: 1x/week  PT DURATION: 8 weeks  PLANNED INTERVENTIONS: 91478- PT Re-evaluation, 97110-Therapeutic exercises, 97530- Therapeutic activity, 97112- Neuromuscular re-education, 97535- Self Care, 29562- Manual therapy, [phone]- Aquatic Therapy, [phone]- Electrical stimulation (unattended), [phone]- Electrical stimulation (manual), [id number]- Ultrasound, 28413- Traction (mechanical), [id number]- Ionotophoresis 4mg /ml Dexamethasone , Patient/Family education, Taping, Dry Needling, Joint mobilization, Spinal mobilization, Cryotherapy, and Moist heat  PLAN FOR NEXT SESSION:  review of scapular series in prone; assess response to DN left upper trap, cervical multifidi, levator scap and suboccipitals; add wall ex's, open books; dec frequency to 1x/week secondary to limited insurance visits approved  Darien Eden, PT 12/13/23 8:45 PM Phone: [phone] Fax: [phone]

## 2023-12-14 ENCOUNTER — Other Ambulatory Visit (HOSPITAL_COMMUNITY): Payer: Self-pay

## 2023-12-18 ENCOUNTER — Other Ambulatory Visit (HOSPITAL_COMMUNITY): Payer: Self-pay

## 2023-12-18 ENCOUNTER — Encounter: Admitting: Physical Therapy

## 2023-12-18 DIAGNOSIS — R21 Rash and other nonspecific skin eruption: Secondary | ICD-10-CM | POA: Diagnosis not present

## 2023-12-18 MED ORDER — DOXYCYCLINE HYCLATE 100 MG PO TABS
100.0000 mg | ORAL_TABLET | Freq: Two times a day (BID) | ORAL | 0 refills | Status: AC
  Filled 2023-12-18: qty 20, 10d supply, fill #0

## 2023-12-20 ENCOUNTER — Ambulatory Visit: Admitting: Physical Therapy

## 2023-12-20 DIAGNOSIS — M6281 Muscle weakness (generalized): Secondary | ICD-10-CM

## 2023-12-20 DIAGNOSIS — G4486 Cervicogenic headache: Secondary | ICD-10-CM

## 2023-12-20 DIAGNOSIS — M542 Cervicalgia: Secondary | ICD-10-CM

## 2023-12-20 NOTE — Therapy (Signed)
 OUTPATIENT PHYSICAL THERAPY CERVICAL PROGRESS NOTE   Patient Name: Megan Greer MRN: [id number] DOB:08-Jan-1956, 68 y.o., female Today's Date: 12/20/2023  END OF SESSION:  PT End of Session - 12/20/23 1141     Visit Number 7    Number of Visits 8    Date for PT Re-Evaluation 02/07/24    Authorization Type [employer]   8 visits   3/6- 5/1, will submit for further visits    PT Start Time 1142    PT Stop Time 1222    PT Time Calculation (min) 40 min    Activity Tolerance Patient tolerated treatment well             Past Medical History:  Diagnosis Date   Anxiety    Diverticulosis    GERD (gastroesophageal reflux disease)    Heart murmur    History of obesity    Hypercholesteremia    Hyperlipidemia    Insomnia    Mitral valve regurgitation    mild   Prediabetes    PVC (premature ventricular contraction)    Serrated adenoma of colon 2015   Sinusitis    Past Surgical History:  Procedure Laterality Date   BUNIONECTOMY     Right foot    DILATION AND CURETTAGE OF UTERUS     Patient Active Problem List   Diagnosis Date Noted   Abdominal pain, chronic, right lower quadrant 08/13/2013   Hypothyroidism 05/06/2013   Transaminitis 01/18/2012   Anxiety and depression 11/17/2011   Preventative health care 11/17/2011   Osteopenia 11/17/2011   Chronic insomnia 11/17/2011   Hyperlipidemia 11/17/2011   Gastroenteritis 11/17/2011    PCP: Charlene Conners MD  REFERRING PROVIDER: Charlene Conners MD  REFERRING DIAG: M54.2 Neck pain  THERAPY DIAG:  Neck pain; weakness; cervicogenic headache  Rationale for Evaluation and Treatment: Rehabilitation  ONSET DATE: 1 year ago  SUBJECTIVE:                                                                                                                                                                                                         SUBJECTIVE STATEMENT: I haven't done my ex's b/c I've been dealing with a  insect bite (tick or spider). I've been taking Benadryl so not feeling my best.  My neck is feeling better.  The DN helped.  I can turn my head to the left much better.  The headaches are better.        PERTINENT HISTORY:   Had DN for hip in the past (hip pain has fully resolved)  Osteopenia Knee meniscal debridement  PAIN:  Are you having pain? Yes NPRS scale: 3/10 with movement Pain location: left neck, upper trap, headaches Pain orientation: Left  PAIN TYPE: aching Pain description: constant  Aggravating factors: takes Tylenol  on nightly basis to get to sleep, hurts in the AM; turning to the left > right (limited); sidebending right/left Relieving factors: Tylenol , Voltaren, heat  PRECAUTIONS: None   WEIGHT BEARING RESTRICTIONS: No  FALLS:  Has patient fallen in last 6 months? No LIVING ENVIRONMENT: Lives with: lives with their spouse Lives in: House/apartment Stairs: ranch home but some steps to higher room    OCCUPATION: works 2x/week  (nursing)   PLOF: Independent  PATIENT GOALS: lessen pain; engage in more activities; better quality of life  NEXT MD VISIT: as needed  OBJECTIVE:  Note: Objective measures were completed at Evaluation unless otherwise noted.  DIAGNOSTIC FINDINGS:  Had U/S on neck slight Dr. Laquita Plant did x-ray of shoulder saw OA "might need replacement in future"  Has been referred to endocrinologist for osteoporosis (appt next week) ASSESSMENT: Feb 2025: The BMD measured at [facility] Sp[facility] L1-L3 is 0.909 g/cm2 with a T-score of -2.2. This patient is considered osteopenic/low bone mass according to World Health Organization [facility]) criteria.   The quality of the exam is good. L4 was excluded due to degenerative changes.   Site Region Measured Date Measured Age YA BMD Significant CHANGE T-score [facility] Sp[facility]  L1-L3      10[birth year]02/2024    [age]         -2.1    0.909 g/cm2   DualFemur Neck Right 10/02/2022    68.5         -1.3    0.860 g/cm2   DualFemur Total  Mean 10/02/2022    68.5         -0.8    0.906 g/cm2   World Health Organization [facility]) criteria for post-menopausal, Caucasian Women: Normal       T-score at or above -1 SD Osteopenia   T-score between -1 and -2.5 SD Osteoporosis T-score at or below -2.5 SD  PATIENT SURVEYS:  NDI 32% 12/13/23:  50%  COGNITION: Overall cognitive status: Within functional limits for tasks assessed  POSTURE: No Significant postural limitations  PALPATION: Tender points in left upper trap and levator scap muscles; fascial restriction suboccipitals   CERVICAL ROM:   Active ROM A/PROM (deg) eval 4/3 5/1  Flexion 45  47  Extension 50  40  Right lateral flexion 24 34 25  Left lateral flexion 34 27 20  Right rotation 43 50 45  Left rotation 18 27 22    (Blank rows = not tested)  Deep cervical flexor and extensors strength 4/5  UPPER EXTREMITY ROM: bil shoulder flexion and abduction 155 degrees, internal rotation T8  UPPER EXTREMITY MMT: glenohumeral strength grossly 4+/5. Scapular depressors and retractors 4-/5  CERVICAL SPECIAL TESTS: + distraction  TREATMENT DATE: 12/20/23 Snag cervical rotation with towel verbal and tactile cues 8x right/left Upper strap stretch with overpressure 30 sec holds 3x Levator scap stretch with overpressure 30 sec holds 3x  Prone: I T Y W with head lift 5x  Discussion of osteopenia (review of DEXA report= osteopenia in lumbar spine and mildly in femur) Counter top push ups for UE weight bearing  Manual therapy: soft tissue mobilization to left upper trap, levator scap, cervical paraspinals Trigger Point Dry Needling Subsequent Treatment: Instructions provided previously at initial dry needling treatment.  Patient Verbal Consent Given: Yes Education Handout  Provided: Previously Provided Muscles Treated: left only: upper trap, levator scap, cervical multifidi, suboccipitals  Electrical Stimulation Performed: No Treatment Response/Outcome: improved soft tissue  mobility and decreased tender point size/number Heat 2 min TREATMENT DATE: 12/13/23 Review of HEP emphasis on cervical SNAG with towel, upper trap and levator scap stretch  return to HEP and consistency Cervical ROM  NDI Manual therapy: soft tissue mobilization to left upper trap, levator scap, cervical paraspinals, left suboccipitals Trigger Point Dry Needling Subsequent Treatment: Instructions provided previously at initial dry needling treatment.  Patient Verbal Consent Given: Yes Education Handout Provided: Previously Provided Muscles Treated: left only: upper trap, levator scap, cervical multifidi, suboccipitals  Electrical Stimulation Performed: No Treatment Response/Outcome: improved soft tissue mobility and decreased tender point size/number Heat 2 min  TREATMENT DATE: 11/22/23 Review of HEP Prone head lifts: arms in I position, T position, W position and Y position 5x each  (added to HEP) Manual therapy: soft tissue mobilization to left upper trap, levator scap, cervical paraspinals, left suboccipitals Trigger Point Dry Needling Subsequent Treatment: Instructions provided previously at initial dry needling treatment.  Patient Verbal Consent Given: Yes Education Handout Provided: Previously Provided Muscles Treated: left only: upper trap, levator scap, cervical multifidi, suboccipitals  Electrical Stimulation Performed: No Treatment Response/Outcome: improved soft tissue mobility and decreased tender point size/number Heat 2 min  TREATMENT DATE: 11/15/23 Cervical ROM  Review of green band resistance ex's (cues on anchoring over the top of the door and standing close to keep band from sliding off)  Seated upper trap stretch x 30 sec hold  Seated levator scap stretch x 30 sec hold  Cervical SNAG 8x right/left (correction of technique top arm at eyebrow level) Manual therapy: soft tissue mobilization to left upper trap, levator scap, cervical paraspinals, left  suboccipitals Trigger Point Dry Needling Subsequent Treatment: Instructions provided previously at initial dry needling treatment.  Patient Verbal Consent Given: Yes Education Handout Provided: Previously Provided Muscles Treated: left only: upper trap, levator scap, cervical multifidi, suboccipitals  Electrical Stimulation Performed: No Treatment Response/Outcome: improved soft tissue mobility and decreased tender point size/number   PATIENT EDUCATION:  Education details: Educated patient on anatomy and physiology of current symptoms, prognosis, plan of care as well as initial self care strategies to promote recovery Person educated: Patient Education method: Explanation Education comprehension: verbalized understanding  HOME EXERCISE PROGRAM: Access Code: [email or web address] URL: [email or web address] Date: 11/22/2023 Prepared by: Darien Eden  Exercises - Seated Upper Trapezius Stretch  - 1 x daily - 7 x weekly - 1 sets - 2-3 reps - 30 hold - Seated Levator Scapulae Stretch  - 1 x daily - 7 x weekly - 1 sets - 2-3 reps - 30 hold - Seated Assisted Cervical Rotation with Towel  - 1 x daily - 7 x weekly - 1 sets - 8 reps - Seated Scapular Retraction  - 1 x daily - 7 x weekly - 1 sets - 10 reps - Standing Row with Anchored Resistance  - 1 x daily - 7 x weekly - 2 sets - 10 reps - Shoulder extension with resistance - Neutral  - 1 x daily - 7 x weekly - 2 sets - 10 reps - Prone Scapular Slide with Shoulder Extension  - 1 x daily - 7 x weekly - 1 sets - 5 reps - Prone Shoulder Horizontal Abduction with Thumbs Up  - 1 x daily - 7 x weekly - 1 sets - 5 reps - Prone W Scapular Retraction  - 1 x  daily - 7 x weekly - 1 sets - 5 reps - Prone Single Arm Shoulder Y  - 1 x daily - 7 x weekly - 1 sets - 5 reps ASSESSMENT:  CLINICAL IMPRESSION: Francile's neck pain and headache is much improved since last visit.  Improved left rotation ROM noted as well.  Review of HEP performed with verbal  and tactile cues for technique and to avoid compensatory patterns.  She responds well to DN to address tender points and taut bands on the left side.  Improved soft tissue mobility and muscle length following session.  Therapist educating patient on strategies to building stronger bones in UEs and cervical spine.        OBJECTIVE IMPAIRMENTS: decreased activity tolerance, decreased ROM, decreased strength, increased fascial restrictions, impaired perceived functional ability, and pain.   ACTIVITY LIMITATIONS: carrying, lifting, sleeping, and caring for others  PARTICIPATION LIMITATIONS: cleaning, laundry, driving, and occupation  PERSONAL FACTORS: Time since onset of injury/illness/exacerbation and 1 comorbidity: osteopenia are also affecting patient's functional outcome.   REHAB POTENTIAL: Good  CLINICAL DECISION MAKING: Stable/uncomplicated  EVALUATION COMPLEXITY: Low   GOALS: Goals reviewed with patient? Yes  SHORT TERM GOALS: Target date: 11/15/2023   The patient will demonstrate knowledge of basic self care strategies and exercises to promote healing  Baseline:  Goal status: met 4/3  2.  The patient will report a 30% improvement in neck and headache pain levels with functional activities which are currently difficult including turning her head, going to sleep and first thing in the AM Baseline:  Goal status: met 4/3 3.  Improved cervical rotation to 50 degrees needed for driving Baseline:  Goal status:  partially met 4/3   LONG TERM GOALS: Target date: 02/07/2024    The patient will be independent in a safe self progression of a home exercise program to promote further recovery of function  Baseline:  Goal status: INITIAL  2.  The patient will report a 60% improvement in neck and headache pain levels with functional activities which are currently difficult including turning her head, going to sleep and first thing in the AM Baseline:  Goal status: INITIAL  3.   Cervical and scapular strength grossly 4+/5 needed for carrying shopping bags and luggage Baseline:  Goal status: INITIAL  4.  Cervical sidebending to 40 degrees and rotation to 55 degrees needed for driving Baseline:  Goal status: INITIAL  5.  Neck Disability Index improved to    24 % indicating improved function with less pain Baseline:  Goal status: INITIAL    PLAN:  PT FREQUENCY: 1x/week  PT DURATION: 8 weeks  PLANNED INTERVENTIONS: 29562- PT Re-evaluation, 97110-Therapeutic exercises, 97530- Therapeutic activity, 97112- Neuromuscular re-education, 97535- Self Care, 13086- Manual therapy, [phone]- Aquatic Therapy, [phone]- Electrical stimulation (unattended), [phone]- Electrical stimulation (manual), [id number]- Ultrasound, 24401- Traction (mechanical), [id number]- Ionotophoresis 4mg /ml Dexamethasone , Patient/Family education, Taping, Dry Needling, Joint mobilization, Spinal mobilization, Cryotherapy, and Moist heat  PLAN FOR NEXT SESSION:  try 1 or 2# dumbbells shoulder 3 ways; scapular series in prone; assess response to DN left upper trap, cervical multifidi, levator scap and suboccipitals; add wall ex's, open books; dec frequency to 1x/week secondary to limited insurance visits approved  Darien Eden, PT 12/20/23 2:07 PM Phone: [phone] Fax: [phone]

## 2023-12-27 ENCOUNTER — Ambulatory Visit: Admitting: Physical Therapy

## 2023-12-27 DIAGNOSIS — M6281 Muscle weakness (generalized): Secondary | ICD-10-CM | POA: Diagnosis not present

## 2023-12-27 DIAGNOSIS — M542 Cervicalgia: Secondary | ICD-10-CM

## 2023-12-27 DIAGNOSIS — G4486 Cervicogenic headache: Secondary | ICD-10-CM | POA: Diagnosis not present

## 2023-12-27 NOTE — Therapy (Signed)
 OUTPATIENT PHYSICAL THERAPY CERVICAL PROGRESS NOTE   Patient Name: Megan Greer MRN: [id number] DOB:07-08-1956, 68 y.o., female Today's Date: 12/27/2023  END OF SESSION:  PT End of Session - 12/27/23 0847     Visit Number 8    Date for PT Re-Evaluation 02/07/24    Authorization Type [employer] Medicare   8 visits   3/6- 5/1, will submit for further visits    PT Start Time 0847    PT Stop Time 0930    PT Time Calculation (min) 43 min    Activity Tolerance Patient tolerated treatment well             Past Medical History:  Diagnosis Date   Anxiety    Diverticulosis    GERD (gastroesophageal reflux disease)    Heart murmur    History of obesity    Hypercholesteremia    Hyperlipidemia    Insomnia    Mitral valve regurgitation    mild   Prediabetes    PVC (premature ventricular contraction)    Serrated adenoma of colon 2015   Sinusitis    Past Surgical History:  Procedure Laterality Date   BUNIONECTOMY     Right foot    DILATION AND CURETTAGE OF UTERUS     Patient Active Problem List   Diagnosis Date Noted   Abdominal pain, chronic, right lower quadrant 08/13/2013   Hypothyroidism 05/06/2013   Transaminitis 01/18/2012   Anxiety and depression 11/17/2011   Preventative health care 11/17/2011   Osteopenia 11/17/2011   Chronic insomnia 11/17/2011   Hyperlipidemia 11/17/2011   Gastroenteritis 11/17/2011    PCP: Charlene Conners MD  REFERRING PROVIDER: Charlene Conners MD  REFERRING DIAG: M54.2 Neck pain  THERAPY DIAG:  Neck pain; weakness; cervicogenic headache  Rationale for Evaluation and Treatment: Rehabilitation  ONSET DATE: 1 year ago  SUBJECTIVE:                                                                                                                                                                                                         SUBJECTIVE STATEMENT: There is a spot (near C7 left) and left base of head.  Less upper trap pain.        PERTINENT HISTORY:   Had DN for hip in the past (hip pain has fully resolved) Osteopenia Knee meniscal debridement  PAIN:  Are you having pain? Yes NPRS scale: 3-4/10  Pain location: left neck, upper trap, headaches Pain orientation: Left  PAIN TYPE: aching Pain description: constant  Aggravating factors: takes Tylenol  on nightly basis to  get to sleep, hurts in the AM; turning to the left > right (limited); sidebending right/left Relieving factors: Tylenol , Voltaren, heat  PRECAUTIONS: None   WEIGHT BEARING RESTRICTIONS: No  FALLS:  Has patient fallen in last 6 months? No LIVING ENVIRONMENT: Lives with: lives with their spouse Lives in: House/apartment Stairs: ranch home but some steps to higher room    OCCUPATION: works 2x/week  (nursing)   PLOF: Independent  PATIENT GOALS: lessen pain; engage in more activities; better quality of life  NEXT MD VISIT: as needed  OBJECTIVE:  Note: Objective measures were completed at Evaluation unless otherwise noted.  DIAGNOSTIC FINDINGS:  Had U/S on neck slight Dr. Laquita Plant did x-ray of shoulder saw OA "might need replacement in future"  Has been referred to endocrinologist for osteoporosis (appt next week) ASSESSMENT: Feb 2025: The BMD measured at [facility] Sp[facility] L1-L3 is 0.909 g/cm2 with a T-score of -2.2. This patient is considered osteopenic/low bone mass according to World Health Organization [facility]) criteria.   The quality of the exam is good. L4 was excluded due to degenerative changes.   Site Region Measured Date Measured Age YA BMD Significant CHANGE T-score [facility]  L1-L3      10[birth year]02/20[birth year]    [age]         -2.1    0.909 g/cm2   DualFemur Neck Right 10/02/2023    68.5         -1.3    0.860 g/cm2   DualFemur Total Mean 10[age]02/2022    68.5         -0.7    0.906 g/cm2   World Health Organization [facility]) criteria for post-menopausal, Caucasian Women: Normal       T-score at or above -1 SD Osteopenia   T-score between -1  and -2.5 SD Osteoporosis T-score at or below -2.5 SD  PATIENT SURVEYS:  NDI 32% 12/13/23:  50%  COGNITION: Overall cognitive status: Within functional limits for tasks assessed  POSTURE: No Significant postural limitations  PALPATION: Tender points in left upper trap and levator scap muscles; fascial restriction suboccipitals   CERVICAL ROM:   Active ROM A/PROM (deg) eval 4/3 5/1  Flexion 45  47  Extension 50  40  Right lateral flexion 24 34 25  Left lateral flexion 34 27 20  Right rotation 43 50 45  Left rotation 18 27 22    (Blank rows = not tested)  Deep cervical flexor and extensors strength 4/5  UPPER EXTREMITY ROM: bil shoulder flexion and abduction 155 degrees, internal rotation T8  UPPER EXTREMITY MMT: glenohumeral strength grossly 4+/5. Scapular depressors and retractors 4-/5  CERVICAL SPECIAL TESTS: + distraction  TREATMENT DATE: 12/27/23 Counter top push ups for UE weight bearing 10x 1# standing shoulder 3 ways: flexion, scaption, abduction 10x each (added to HEP) Standing overhead press 10x 1# Bent row 3# 10x (added to HEP) Manual therapy: soft tissue mobilization to left upper trap, levator scap, cervical paraspinals Trigger Point Dry Needling Subsequent Treatment: Instructions provided previously at initial dry needling treatment.  Patient Verbal Consent Given: Yes Education Handout Provided: Previously Provided Muscles Treated: left only: upper trap, levator scap, cervical multifidi, suboccipitals  Electrical Stimulation Performed: No Treatment Response/Outcome: improved soft tissue mobility and decreased tender point size/number Heat 2 min TREATMENT DATE: 12/20/23 Snag cervical rotation with towel verbal and tactile cues 8x right/left Upper strap stretch with overpressure 30 sec holds 3x Levator scap stretch with overpressure 30 sec holds 3x  Prone: I T Y W with head  lift 5x  Discussion of osteopenia (review of DEXA report= osteopenia in lumbar spine  and mildly in femur) Counter top push ups for UE weight bearing  Manual therapy: soft tissue mobilization to left upper trap, levator scap, cervical paraspinals Trigger Point Dry Needling Subsequent Treatment: Instructions provided previously at initial dry needling treatment.  Patient Verbal Consent Given: Yes Education Handout Provided: Previously Provided Muscles Treated: left only: upper trap, levator scap, cervical multifidi, suboccipitals  Electrical Stimulation Performed: No Treatment Response/Outcome: improved soft tissue mobility and decreased tender point size/number Heat 2 min TREATMENT DATE: 12/13/23 Review of HEP emphasis on cervical SNAG with towel, upper trap and levator scap stretch  return to HEP and consistency Cervical ROM  NDI Manual therapy: soft tissue mobilization to left upper trap, levator scap, cervical paraspinals, left suboccipitals Trigger Point Dry Needling Subsequent Treatment: Instructions provided previously at initial dry needling treatment.  Patient Verbal Consent Given: Yes Education Handout Provided: Previously Provided Muscles Treated: left only: upper trap, levator scap, cervical multifidi, suboccipitals  Electrical Stimulation Performed: No Treatment Response/Outcome: improved soft tissue mobility and decreased tender point size/number Heat 2 min  TREATMENT DATE: 11/22/23 Review of HEP Prone head lifts: arms in I position, T position, W position and Y position 5x each  (added to HEP) Manual therapy: soft tissue mobilization to left upper trap, levator scap, cervical paraspinals, left suboccipitals Trigger Point Dry Needling Subsequent Treatment: Instructions provided previously at initial dry needling treatment.  Patient Verbal Consent Given: Yes Education Handout Provided: Previously Provided Muscles Treated: left only: upper trap, levator scap, cervical multifidi, suboccipitals  Electrical Stimulation Performed: No Treatment Response/Outcome:  improved soft tissue mobility and decreased tender point size/number   PATIENT EDUCATION:  Education details: Educated patient on anatomy and physiology of current symptoms, prognosis, plan of care as well as initial self care strategies to promote recovery Person educated: Patient Education method: Explanation Education comprehension: verbalized understanding  HOME EXERCISE PROGRAM: Access Code: [email or web address] URL: [email or web address] Date: 12/27/2023 Prepared by: Darien Eden  Exercises - Seated Upper Trapezius Stretch  - 1 x daily - 7 x weekly - 1 sets - 2-3 reps - 30 hold - Seated Levator Scapulae Stretch  - 1 x daily - 7 x weekly - 1 sets - 2-3 reps - 30 hold - Seated Assisted Cervical Rotation with Towel  - 1 x daily - 7 x weekly - 1 sets - 8 reps - Seated Scapular Retraction  - 1 x daily - 7 x weekly - 1 sets - 10 reps - Standing Row with Anchored Resistance  - 1 x daily - 7 x weekly - 2 sets - 10 reps - Shoulder extension with resistance - Neutral  - 1 x daily - 7 x weekly - 2 sets - 10 reps - Prone Scapular Slide with Shoulder Extension  - 1 x daily - 7 x weekly - 1 sets - 5 reps - Prone Shoulder Horizontal Abduction with Thumbs Up  - 1 x daily - 7 x weekly - 1 sets - 5 reps - Prone W Scapular Retraction  - 1 x daily - 7 x weekly - 1 sets - 5 reps - Prone Single Arm Shoulder Y  - 1 x daily - 7 x weekly - 1 sets - 5 reps - Standing Shoulder Flexion to 90 Degrees with Dumbbells  - 1 x daily - 7 x weekly - 1 sets - 10 reps - Scaption with Dumbbells  - 1 x daily - 7 x weekly -  1 sets - 10 reps - Standing Shoulder Horizontal Abduction with Dumbbells - Thumbs Up  - 1 x daily - 7 x weekly - 1 sets - 10 reps - Bent Over Single Arm Shoulder Row with Dumbbell  - 1 x daily - 7 x weekly - 1 sets - 10 reps ASSESSMENT:  CLINICAL IMPRESSION: Therapist progressing and updating HEP for increased intensity and challenge level for further strengthening and functional mobility.   The  patient benefits significantly from dry needling and manual therapy to stimulate underlying myofascial trigger points and muscular tissue for management of neuromusculoskeletal pain and address movement impairments.  Much improved soft tissue mobility and decreased tender point size and number following treatment session.       OBJECTIVE IMPAIRMENTS: decreased activity tolerance, decreased ROM, decreased strength, increased fascial restrictions, impaired perceived functional ability, and pain.   ACTIVITY LIMITATIONS: carrying, lifting, sleeping, and caring for others  PARTICIPATION LIMITATIONS: cleaning, laundry, driving, and occupation  PERSONAL FACTORS: Time since onset of injury/illness/exacerbation and 1 comorbidity: osteopenia are also affecting patient's functional outcome.   REHAB POTENTIAL: Good  CLINICAL DECISION MAKING: Stable/uncomplicated  EVALUATION COMPLEXITY: Low   GOALS: Goals reviewed with patient? Yes  SHORT TERM GOALS: Target date: 11/15/2023   The patient will demonstrate knowledge of basic self care strategies and exercises to promote healing  Baseline:  Goal status: met 4/3  2.  The patient will report a 30% improvement in neck and headache pain levels with functional activities which are currently difficult including turning her head, going to sleep and first thing in the AM Baseline:  Goal status: met 4/3 3.  Improved cervical rotation to 50 degrees needed for driving Baseline:  Goal status:  partially met 4/3   LONG TERM GOALS: Target date: 02/07/2024    The patient will be independent in a safe self progression of a home exercise program to promote further recovery of function  Baseline:  Goal status: INITIAL  2.  The patient will report a 60% improvement in neck and headache pain levels with functional activities which are currently difficult including turning her head, going to sleep and first thing in the AM Baseline:  Goal status: INITIAL  3.   Cervical and scapular strength grossly 4+/5 needed for carrying shopping bags and luggage Baseline:  Goal status: INITIAL  4.  Cervical sidebending to 40 degrees and rotation to 55 degrees needed for driving Baseline:  Goal status: INITIAL  5.  Neck Disability Index improved to    24 % indicating improved function with less pain Baseline:  Goal status: INITIAL    PLAN:  PT FREQUENCY: 1x/week  PT DURATION: 8 weeks  PLANNED INTERVENTIONS: 04540- PT Re-evaluation, 97110-Therapeutic exercises, 97530- Therapeutic activity, 97112- Neuromuscular re-education, 97535- Self Care, 98119- Manual therapy, [phone]- Aquatic Therapy, [phone]- Electrical stimulation (unattended), [phone]- Electrical stimulation (manual), [id number]- Ultrasound, 78469- Traction (mechanical), [id number]- Ionotophoresis 4mg /ml Dexamethasone , Patient/Family education, Taping, Dry Needling, Joint mobilization, Spinal mobilization, Cryotherapy, and Moist heat  PLAN FOR NEXT SESSION:   shoulder 3 ways 1#; scapular series in prone; assess response to DN left upper trap, cervical multifidi, levator scap and suboccipitals; wall ex's, open books; dec frequency to 1x/week secondary to limited insurance visits approved  Darien Eden, PT 12/27/23 5:57 PM Phone: [phone] Fax: [phone]

## 2024-01-03 ENCOUNTER — Ambulatory Visit: Admitting: Physical Therapy

## 2024-01-03 DIAGNOSIS — G4486 Cervicogenic headache: Secondary | ICD-10-CM | POA: Diagnosis not present

## 2024-01-03 DIAGNOSIS — M542 Cervicalgia: Secondary | ICD-10-CM | POA: Diagnosis not present

## 2024-01-03 DIAGNOSIS — M6281 Muscle weakness (generalized): Secondary | ICD-10-CM

## 2024-01-03 NOTE — Therapy (Signed)
 OUTPATIENT PHYSICAL THERAPY CERVICAL PROGRESS NOTE   Patient Name: Megan Greer MRN: [id number] DOB:Feb 16, 1956, 68 y.o., female Today's Date: 01/03/2024  END OF SESSION:  PT End of Session - 01/03/24 1057     Visit Number 9    Number of Visits 16    Date for PT Re-Evaluation 02/07/24    Authorization Type [employer] Medicare  8 + 8 visits  5/2-6/27    PT Start Time 1101    PT Stop Time 1144    PT Time Calculation (min) 43 min    Activity Tolerance Patient tolerated treatment well             Past Medical History:  Diagnosis Date   Anxiety    Diverticulosis    GERD (gastroesophageal reflux disease)    Heart murmur    History of obesity    Hypercholesteremia    Hyperlipidemia    Insomnia    Mitral valve regurgitation    mild   Prediabetes    PVC (premature ventricular contraction)    Serrated adenoma of colon 2015   Sinusitis    Past Surgical History:  Procedure Laterality Date   BUNIONECTOMY     Right foot    DILATION AND CURETTAGE OF UTERUS     Patient Active Problem List   Diagnosis Date Noted   Abdominal pain, chronic, right lower quadrant 08/13/2013   Hypothyroidism 05/06/2013   Transaminitis 01/18/2012   Anxiety and depression 11/17/2011   Preventative health care 11/17/2011   Osteopenia 11/17/2011   Chronic insomnia 11/17/2011   Hyperlipidemia 11/17/2011   Gastroenteritis 11/17/2011    PCP: Charlene Conners MD  REFERRING PROVIDER: Charlene Conners MD  REFERRING DIAG: M54.2 Neck pain  THERAPY DIAG:  Neck pain; weakness; cervicogenic headache  Rationale for Evaluation and Treatment: Rehabilitation  ONSET DATE: 1 year ago  SUBJECTIVE:                                                                                                                                                                                                         SUBJECTIVE STATEMENT: Went to [employer] class and it went fine.  The next day I was drained at work.   The neck started to hurt but that class really helped.  Difficulty turning to the left.        PERTINENT HISTORY:   Had DN for hip in the past (hip pain has fully resolved) Osteopenia Knee meniscal debridement  PAIN:  Are you having pain? Yes NPRS scale: 3-4/10  Pain location: left neck, upper trap,  headaches Pain orientation: Left  PAIN TYPE: aching Pain description: constant  Aggravating factors: takes Tylenol  on nightly basis to get to sleep, hurts in the AM; turning to the left > right (limited); sidebending right/left Relieving factors: Tylenol , Voltaren, heat  PRECAUTIONS: None   WEIGHT BEARING RESTRICTIONS: No  FALLS:  Has patient fallen in last 6 months? No LIVING ENVIRONMENT: Lives with: lives with their spouse Lives in: House/apartment Stairs: ranch home but some steps to higher room    OCCUPATION: works 2x/week  (nursing)   PLOF: Independent  PATIENT GOALS: lessen pain; engage in more activities; better quality of life  NEXT MD VISIT: as needed  OBJECTIVE:  Note: Objective measures were completed at Evaluation unless otherwise noted.  DIAGNOSTIC FINDINGS:  Had U/S on neck slight Dr. Laquita Plant did x-ray of shoulder saw OA "might need replacement in future"  Has been referred to endocrinologist for osteoporosis (appt next week) ASSESSMENT: Feb 2025: The BMD measured at [facility] L1-L3 is 0.909 g/cm2 with a T-score of -2.2. This patient is considered osteopenic/low bone mass according to World Health Organization [facility]) criteria.   The quality of the exam is good. L4 was excluded due to degenerative changes.   Site Region Measured Date Measured Age YA BMD Significant CHANGE T-score [facility]  L1-L3      10/02/20[age]         -2.2    0.909 g/cm2   DualFemur Neck Right 10/02/20[age].5         -1.3    0.860 g/cm2   DualFemur Total Mean 10[age]02/2023    68.5         -0.7    0.906 g/cm2   World Health Organization [facility]) criteria for  post-menopausal, Caucasian Women: Normal       T-score at or above -1 SD Osteopenia   T-score between -1 and -2.5 SD Osteoporosis T-score at or below -2.5 SD  PATIENT SURVEYS:  NDI 32% 12/13/23:  50%  COGNITION: Overall cognitive status: Within functional limits for tasks assessed  POSTURE: No Significant postural limitations  PALPATION: Tender points in left upper trap and levator scap muscles; fascial restriction suboccipitals   CERVICAL ROM:   Active ROM A/PROM (deg) eval 4/3 5/1  Flexion 45  47  Extension 50  40  Right lateral flexion 24 34 25  Left lateral flexion 34 27 20  Right rotation 43 50 45  Left rotation 18 27 22    (Blank rows = not tested)  Deep cervical flexor and extensors strength 4/5  UPPER EXTREMITY ROM: bil shoulder flexion and abduction 155 degrees, internal rotation T8  UPPER EXTREMITY MMT: glenohumeral strength grossly 4+/5. Scapular depressors and retractors 4-/5  CERVICAL SPECIAL TESTS: + distraction  TREATMENT DATE: 01/03/24 Encourage hydration pre and post exercise  Discussion of response to Silver Sneakers Review of shoulder 3 ways 1#;  (recommend keeping the weight at 1# for several weeks before increasing) Encouraged cervical SNAG rotation Manual therapy: soft tissue mobilization to left upper trap, levator scap, cervical paraspinals Trigger Point Dry Needling Subsequent Treatment: Instructions provided previously at initial dry needling treatment.  Patient Verbal Consent Given: Yes Education Handout Provided: Previously Provided Muscles Treated: left only: upper trap, levator scap, cervical multifidi, suboccipitals  Electrical Stimulation Performed: No Treatment Response/Outcome: improved soft tissue mobility and decreased tender point size/number    TREATMENT DATE: 12/27/23 Counter top push ups for UE weight bearing 10x 1# standing shoulder 3 ways: flexion, scaption,  abduction 10x each (added to HEP) Standing overhead press 10x  1# Bent row 3# 10x (added to HEP) Manual therapy: soft tissue mobilization to left upper trap, levator scap, cervical paraspinals Trigger Point Dry Needling Subsequent Treatment: Instructions provided previously at initial dry needling treatment.  Patient Verbal Consent Given: Yes Education Handout Provided: Previously Provided Muscles Treated: left only: upper trap, levator scap, cervical multifidi, suboccipitals  Electrical Stimulation Performed: No Treatment Response/Outcome: improved soft tissue mobility and decreased tender point size/number Heat 2 min TREATMENT DATE: 12/20/23 Snag cervical rotation with towel verbal and tactile cues 8x right/left Upper strap stretch with overpressure 30 sec holds 3x Levator scap stretch with overpressure 30 sec holds 3x  Prone: I T Y W with head lift 5x  Discussion of osteopenia (review of DEXA report= osteopenia in lumbar spine and mildly in femur) Counter top push ups for UE weight bearing  Manual therapy: soft tissue mobilization to left upper trap, levator scap, cervical paraspinals Trigger Point Dry Needling Subsequent Treatment: Instructions provided previously at initial dry needling treatment.  Patient Verbal Consent Given: Yes Education Handout Provided: Previously Provided Muscles Treated: left only: upper trap, levator scap, cervical multifidi, suboccipitals  Electrical Stimulation Performed: No Treatment Response/Outcome: improved soft tissue mobility and decreased tender point size/number Heat 2 min TREATMENT DATE: 12/13/23 Review of HEP emphasis on cervical SNAG with towel, upper trap and levator scap stretch  return to HEP and consistency Cervical ROM  NDI Manual therapy: soft tissue mobilization to left upper trap, levator scap, cervical paraspinals, left suboccipitals Trigger Point Dry Needling Subsequent Treatment: Instructions provided previously at initial dry needling treatment.  Patient Verbal Consent Given: Yes Education  Handout Provided: Previously Provided Muscles Treated: left only: upper trap, levator scap, cervical multifidi, suboccipitals  Electrical Stimulation Performed: No Treatment Response/Outcome: improved soft tissue mobility and decreased tender point size/number Heat 2 min     PATIENT EDUCATION:  Education details: Educated patient on anatomy and physiology of current symptoms, prognosis, plan of care as well as initial self care strategies to promote recovery Person educated: Patient Education method: Explanation Education comprehension: verbalized understanding  HOME EXERCISE PROGRAM: Access Code: [email or web address] URL: [email or web address] Date: 12/27/2023 Prepared by: Darien Eden  Exercises - Seated Upper Trapezius Stretch  - 1 x daily - 7 x weekly - 1 sets - 2-3 reps - 30 hold - Seated Levator Scapulae Stretch  - 1 x daily - 7 x weekly - 1 sets - 2-3 reps - 30 hold - Seated Assisted Cervical Rotation with Towel  - 1 x daily - 7 x weekly - 1 sets - 8 reps - Seated Scapular Retraction  - 1 x daily - 7 x weekly - 1 sets - 10 reps - Standing Row with Anchored Resistance  - 1 x daily - 7 x weekly - 2 sets - 10 reps - Shoulder extension with resistance - Neutral  - 1 x daily - 7 x weekly - 2 sets - 10 reps - Prone Scapular Slide with Shoulder Extension  - 1 x daily - 7 x weekly - 1 sets - 5 reps - Prone Shoulder Horizontal Abduction with Thumbs Up  - 1 x daily - 7 x weekly - 1 sets - 5 reps - Prone W Scapular Retraction  - 1 x daily - 7 x weekly - 1 sets - 5 reps - Prone Single Arm Shoulder Y  - 1 x daily - 7 x weekly - 1 sets - 5 reps - Standing Shoulder Flexion  to 90 Degrees with Dumbbells  - 1 x daily - 7 x weekly - 1 sets - 10 reps - Scaption with Dumbbells  - 1 x daily - 7 x weekly - 1 sets - 10 reps - Standing Shoulder Horizontal Abduction with Dumbbells - Thumbs Up  - 1 x daily - 7 x weekly - 1 sets - 10 reps - Bent Over Single Arm Shoulder Row with Dumbbell  - 1 x  daily - 7 x weekly - 1 sets - 10 reps ASSESSMENT:  CLINICAL IMPRESSION: Patient returned to Silver Sneakers with a positive response for neck pain.  The patient benefits from dry needling and manual therapy to stimulate underlying myofascial trigger points and muscular tissue with decreasing number of tender points.  The patient was encouraged in regular performance of HEP post DN including soft tissue lengthening and strengthening exercises to enhance long term benefit.     OBJECTIVE IMPAIRMENTS: decreased activity tolerance, decreased ROM, decreased strength, increased fascial restrictions, impaired perceived functional ability, and pain.   ACTIVITY LIMITATIONS: carrying, lifting, sleeping, and caring for others  PARTICIPATION LIMITATIONS: cleaning, laundry, driving, and occupation  PERSONAL FACTORS: Time since onset of injury/illness/exacerbation and 1 comorbidity: osteopenia are also affecting patient's functional outcome.   REHAB POTENTIAL: Good  CLINICAL DECISION MAKING: Stable/uncomplicated  EVALUATION COMPLEXITY: Low   GOALS: Goals reviewed with patient? Yes  SHORT TERM GOALS: Target date: 11/15/2023   The patient will demonstrate knowledge of basic self care strategies and exercises to promote healing  Baseline:  Goal status: met 4/3  2.  The patient will report a 30% improvement in neck and headache pain levels with functional activities which are currently difficult including turning her head, going to sleep and first thing in the AM Baseline:  Goal status: met 4/3 3.  Improved cervical rotation to 50 degrees needed for driving Baseline:  Goal status:  partially met 4/3   LONG TERM GOALS: Target date: 02/07/2024    The patient will be independent in a safe self progression of a home exercise program to promote further recovery of function  Baseline:  Goal status: INITIAL  2.  The patient will report a 60% improvement in neck and headache pain levels with  functional activities which are currently difficult including turning her head, going to sleep and first thing in the AM Baseline:  Goal status: INITIAL  3.  Cervical and scapular strength grossly 4+/5 needed for carrying shopping bags and luggage Baseline:  Goal status: INITIAL  4.  Cervical sidebending to 40 degrees and rotation to 55 degrees needed for driving Baseline:  Goal status: INITIAL  5.  Neck Disability Index improved to    24 % indicating improved function with less pain Baseline:  Goal status: INITIAL    PLAN:  PT FREQUENCY: 1x/week  PT DURATION: 8 weeks  PLANNED INTERVENTIONS: 97164- PT Re-evaluation, 97110-Therapeutic exercises, 97530- Therapeutic activity, 97112- Neuromuscular re-education, 97535- Self Care, 96045- Manual therapy, [phone]- Aquatic Therapy, [id number]- Electrical stimulation (unattended), [phone]- Electrical stimulation (manual), [id number]- Ultrasound, 21308- Traction (mechanical), [id number]- Ionotophoresis 4mg /ml Dexamethasone , Patient/Family education, Taping, Dry Needling, Joint mobilization, Spinal mobilization, Cryotherapy, and Moist heat  PLAN FOR NEXT SESSION:   shoulder 3 ways 1#; scapular series in prone; assess response to DN left upper trap, cervical multifidi, levator scap and suboccipitals; try wall ex's  Darien Eden, PT 01/03/24 3:19 PM Phone: [phone] Fax: [phone]

## 2024-01-05 DIAGNOSIS — S8992XA Unspecified injury of left lower leg, initial encounter: Secondary | ICD-10-CM | POA: Diagnosis not present

## 2024-01-05 DIAGNOSIS — S99922A Unspecified injury of left foot, initial encounter: Secondary | ICD-10-CM | POA: Diagnosis not present

## 2024-01-05 DIAGNOSIS — S99921A Unspecified injury of right foot, initial encounter: Secondary | ICD-10-CM | POA: Diagnosis not present

## 2024-01-05 DIAGNOSIS — S8991XA Unspecified injury of right lower leg, initial encounter: Secondary | ICD-10-CM | POA: Diagnosis not present

## 2024-01-05 NOTE — Progress Notes (Signed)
 E@ is a 68 y.o.  with  reports that she has quit smoking. She has never used smokeless tobacco. with  Active Ambulatory Problems    Diagnosis Date Noted  . Anxiety and depression 11/17/2011  . Chronic insomnia 11/17/2011  . Chronic low back pain 11/16/2017  . Gastroenteritis 11/17/2011  . Hyperlipidemia 11/17/2011  . Superficial varicosities, unspecified laterality 06/30/2020  . Chronic pain of left ankle 06/30/2020  . Hx of arthroscopy of left knee 06/30/2020  . Chronic pain of left knee 06/30/2020  . Iliotibial band syndrome affecting left lower leg 08/18/2020  . Neck pain 11/09/2023   Resolved Ambulatory Problems    Diagnosis Date Noted  . No Resolved Ambulatory Problems   No Additional Past Medical History   who presents today at Urgent Care for  Chief Complaint  Patient presents with  . Hip Pain    Clemens about 2 hrs ago on in the bathroom, pt landed on her right hip and pt states something is going on with her left big toe. Pt states there is bruising to the left toe. Pt took one extra strength tylenol  and one alive     HPI  68 y.o. female presents today for evaluation of acute joint pain/injury. History of Present Illness The patient is a 68 year old female who presents for evaluation of right hip and left great toe pain.  She experienced a fall this morning while entering the bathroom, attributing the incident to slipping on a scatter rug rather than any pre-existing dizziness. The fall resulted in her landing on her right hip on a hard tile floor. She reports no difficulty in walking due to hip pain. She applied ice to the affected area and took Tylenol  and Aleve for pain management.  During the fall, she also injured her left great toe, which she reports as being more painful than her hip. She is uncertain about the exact point of impact during the fall but suspects it may have been against the toilet or cabinet. She has observed bruising on her toe and notes that it  appears more red since applying ice. The toe is sore to touch and causes discomfort when walking. She has previously consulted a physician regarding a potential shortening of the same toe, which had elongated following a bunionectomy. However, she did not pursue this treatment due to dissatisfaction with the physician.  MEDICATIONS Tylenol , Aleve   Location: right hip, left great toe Precipitating trauma: (fall at home Mechanism: slipped Timing/duration: occurred just PTA Mechanical symptoms: No locking, popping or buckling Swelling/effusion: None  Instability: None Systemic symptoms: None  Patient denies: Fever, joint redness, night sweats, dizziness Denies history of  Rheumatoid Arthritis, Lupus, Gout or Psuedogout   Patient has no identified co-morbidities or medications that might increase the risk for developing a severe infection    She  reports that she has quit smoking. She has never used smokeless tobacco.  Review of Systems  Review of systems is otherwise negative except as noted in the HPI and Assessment/MDM   Physical Exam  BP 143/72   Pulse 61   Temp 98.6 F (37 C) (Tympanic)   Resp 18   Wt 86.2 kg (190 lb)   SpO2 97%   BMI 28.89 kg/m   Constitutional:      General: Patient is not in acute distress.    Appearance: Normal appearance.  Neurological:     General: No focal deficit present.     Mental Status: alert and oriented to person,  place, and time.  HENT:     Eyes:     Conjunctiva/sclera: Conjunctivae normal; sclera clear.     Pupils: Pupils are equal, round, and reactive to light.     Mucus membranes moist. Cardiovascular:     Heart: Regular rate    No Lower extremity edema noted. Pulmonary:     Effort: Pulmonary effort is normal. No conversational dyspnea. No respiratory distress.     Breath sounds: No audible wheezing Skin:    General: Skin is warm and dry. Intact skin. No rash, erythema or ecchymosis.  Psychiatric:        Mood and Affect:  Mood normal. Judgement appears good. Musculoskeletal:        General: No obvious joint or muscle limitation.     Neck:  No rigidity; supple.     Gait: Antalgic, unassisted.   Joint:  No gross abnormalities, no edema no erythema, no induration Tender to palpation over bony prominence of right greater trochanter and left proximal phalanx No TTP of right SI joint, gluteus, pelvis, thigh, knee  Extension and Flexion of right knee without pain. No apparent limitation. Hip flexion without pain  Internal and External rotation without pain. No apparent limitation.  Strength is 5/5 bilaterally  No effusion  Neuro sensation intact              Compartments soft and compressible   Radiologist interpretation:  No orders to display   Study Result  Narrative & Impression  X-RAY HIP RIGHT WITH OR WITHOUT PELVIS (2-3 VIEWS), 01/05/2024 7:48 PM   INDICATION: Unspecified injury of right hip, initial encounter \ S79.911A Unspecified injury of right hip, initial encounter  COMPARISON: Radiograph of the right hip 05/05/2020.   IMPRESSION: 1.  No acute fracture or traumatic malalignment. 2.  Mild degenerative changes of the bilateral hips, bilateral sacroiliac joints, and pubic symphysis. 3.  Partially imaged degenerative changes of the lumbosacral spine   Study Result  Narrative & Impression  XR TOES MINIMUM 2 VIEWS LEFT, 01/05/2024 4:30 PM   INDICATION: Unspecified injury of left foot, initial encounter \ D00.077J Unspecified injury of left foot, initial encounter \ W19.XXXA Unspecified fall, initial encounter \ Y92.009 Unspecified place in unspecified non-institutional (private) residence as the place of occurrence of the external cause    COMPARISON: None   IMPRESSION:   1.  No acute fracture or traumatic malalignment. 2.  Postsurgical changes related to prior hallux valgus correction with bunionectomy. 3.  Moderate degenerative changes of the first MTP joint. 4.  Osteopenia.    I have  personally review radiographs and my interpretation of the radiograph is:    No acute fracture seen. No dislocation or other bony abnormality identified. Patient was called with study results.  No orders of the defined types were placed in this encounter.  No results found for this visit on 01/05/24.    X-ray(s) were reviewed and incorporated into the decision making process.  DIAGNOSIS/PLAN  1. Hip injury, right, initial encounter  Amb DME post-op shoe (left)   XR Hip Uni W Or W/O Pelvis 2-3 Vw Rt   XR Hip Uni W Or W/O Pelvis 2-3 Vw Rt   CANCELED: XR Hip Uni W Or W/O Pelvis 2-3 Vw Rt   CANCELED: XR Hip Uni W Or W/O Pelvis 2-3 Vw Rt    2. Injury of left great toe, initial encounter  Amb DME post-op shoe (left)   XR Toes Minimum 2 Views Left   CANCELED: XR Toes  Minimum 2 Views Left   CANCELED: XR Toes Minimum 2 Views Left    3. Fall in home, initial encounter  Amb DME post-op shoe (left)   XR Hip Uni W Or W/O Pelvis 2-3 Vw Rt   XR Toes Minimum 2 Views Left   CANCELED: XR Hip Uni W Or W/O Pelvis 2-3 Vw Rt   CANCELED: XR Toes Minimum 2 Views Left      The patient is a 68 year old female who presents for evaluation of right hip and left great toe pain. Injury and pain occurred as a result of fall in the home. No lightheadedness, dizziness or syncope; she slipped on a rug. Patient's right hip pain is exhibited by mild pain with direct palpation of the greater trochanter; no tenderness of the pelvis, gluteus, thigh. She is able to bear weight and walk without limitation. Her left great toe exhibits emerging ecchymosis. Moderate tenderness present over proximal phalanx.  Given patient's symptoms of pain and palpable tenderness , an x-ray of the right hip and was performed in clinic today.  Personal and radiologic interpretation of the x-ray shows no acute fracture, dislocation, alignment or other bony abnormality.  She was placed in a post-op shoe due to pain in the left great toe,  especially with ambulation. Work note for Tuesday provided. Continues to ambulate with only mild pain, which is present in the toe only.  - I have reviewed the concepts of RICE and encouraged: Rest of the injured extremities (if needed); application of Ice for pain and swelling: Compression with applied splint; and Elevation to prevent/minimize swelling.   - Advised to use Tylenol  or Ibuprofen  for pain relief, if not allergic; use ice for 15-20 minutes (but no longer) for pain relief prn.    DDX: acute fracture, subluxation/dislocation, joint effusion, septic joint, gout, inflammatory arthritis  Anticipated course of illness reviewed. Symptomatic management discussed.  Appropriate and anticipated follow up reviewed with patient. Patient was given verbal and written instructions on symptoms that necessitate return to the UC/ED, and instructed to follow up with Urgent Care or their Primary Care Provider if they are not improving in the expected timeframe. Urgent Care Disposition:  Home Care  Patient education (verbal/handout) given on diagnosis, pathophysiology, treatment of diagnosis, medications & side effects, exercises, the follow-up plan and supportives measures.  We discussed risks and side effects of medications, and also discussed red flags which would warrant immediate follow-up.    Patient and/or parent/guardian (if applicable) agreed with plan and voiced understanding.  No barriers to adherence perceived by myself.  Electronically signed by Sonny Caldron Arledge  Sat 01/05/2024 8:03 PM

## 2024-01-08 ENCOUNTER — Encounter: Admitting: Physical Therapy

## 2024-01-09 ENCOUNTER — Ambulatory Visit: Admitting: Physical Therapy

## 2024-01-09 DIAGNOSIS — G4486 Cervicogenic headache: Secondary | ICD-10-CM

## 2024-01-09 DIAGNOSIS — M6281 Muscle weakness (generalized): Secondary | ICD-10-CM | POA: Diagnosis not present

## 2024-01-09 DIAGNOSIS — M542 Cervicalgia: Secondary | ICD-10-CM

## 2024-01-09 NOTE — Therapy (Signed)
 OUTPATIENT PHYSICAL THERAPY CERVICAL PROGRESS NOTE   Patient Name: Megan Greer MRN: [id number] DOB:03-Mar-1956, 68 y.o., female Today's Date: 01/09/2024  END OF SESSION:  PT End of Session - 01/09/24 1449     Visit Number 10    Number of Visits 16    Date for PT Re-Evaluation 02/07/24    Authorization Type [employer] Medicare  8 + 8 visits  5/2-6/27    PT Start Time 1445    PT Stop Time 1528    PT Time Calculation (min) 43 min    Activity Tolerance Patient tolerated treatment well             Past Medical History:  Diagnosis Date   Anxiety    Diverticulosis    GERD (gastroesophageal reflux disease)    Heart murmur    History of obesity    Hypercholesteremia    Hyperlipidemia    Insomnia    Mitral valve regurgitation    mild   Prediabetes    PVC (premature ventricular contraction)    Serrated adenoma of colon 2015   Sinusitis    Past Surgical History:  Procedure Laterality Date   BUNIONECTOMY     Right foot    DILATION AND CURETTAGE OF UTERUS     Patient Active Problem List   Diagnosis Date Noted   Abdominal pain, chronic, right lower quadrant 08/13/2013   Hypothyroidism 05/06/2013   Transaminitis 01/18/2012   Anxiety and depression 11/17/2011   Preventative health care 11/17/2011   Osteopenia 11/17/2011   Chronic insomnia 11/17/2011   Hyperlipidemia 11/17/2011   Gastroenteritis 11/17/2011    PCP: Charlene Conners MD  REFERRING PROVIDER: Charlene Conners MD  REFERRING DIAG: M54.2 Neck pain  THERAPY DIAG:  Neck pain; weakness; cervicogenic headache  Rationale for Evaluation and Treatment: Rehabilitation  ONSET DATE: 1 year ago  SUBJECTIVE:                                                                                                                                                                                                         SUBJECTIVE STATEMENT: Had a 10 hour shift yesterday and coming out of that fatigue today.  Had some pain  after last DN but then it went away.  Fell in the bathroom on Saturday onto the right hip and big toe.       PERTINENT HISTORY:   Had DN for hip in the past (hip pain has fully resolved) Osteopenia Knee meniscal debridement  PAIN:  Are you having pain? Yes NPRS scale: 2-4/10 rest 2, with movement  4 Pain location: left neck, upper trap, headaches Pain orientation: Left  PAIN TYPE: aching Pain description: constant  Aggravating factors: takes Tylenol  on nightly basis to get to sleep, hurts in the AM; turning to the left > right (limited); sidebending right/left Relieving factors: Tylenol , Voltaren, heat  PRECAUTIONS: None   WEIGHT BEARING RESTRICTIONS: No  FALLS:  Has patient fallen in last 6 months? No LIVING ENVIRONMENT: Lives with: lives with their spouse Lives in: House/apartment Stairs: ranch home but some steps to higher room    OCCUPATION: works 2x/week  (nursing)   PLOF: Independent  PATIENT GOALS: lessen pain; engage in more activities; better quality of life  NEXT MD VISIT: as needed  OBJECTIVE:  Note: Objective measures were completed at Evaluation unless otherwise noted.  DIAGNOSTIC FINDINGS:  Had U/S on neck slight Dr. Laquita Plant did x-ray of shoulder saw OA "might need replacement in future"  Has been referred to endocrinologist for osteoporosis (appt next week) ASSESSMENT: Feb 2025: The BMD measured at [facility] Sp[facility] L1-L3 is 0.909 g/cm2 with a T-score of -2.2. This patient is considered osteopenic/low bone mass according to World Health Organization [facility]) criteria.   The quality of the exam is good. L4 was excluded due to degenerative changes.   Site Region Measured Date Measured Age YA BMD Significant CHANGE T-score [facility] Sp[facility]  L1-L3      10[birth year]02/20[age].5         -2.1    0.909 g/cm2   DualFemur Neck Right 10/02/2023    68.5         -1.2    0.860 g/cm2   DualFemur Total Mean 10[age]02/2023    68.5         -0.7    0.906 g/cm2   World Health Organization  [facility]) criteria for post-menopausal, Caucasian Women: Normal       T-score at or above -1 SD Osteopenia   T-score between -1 and -2.5 SD Osteoporosis T-score at or below -2.5 SD  PATIENT SURVEYS:  NDI 32% 12/13/23:  50%  COGNITION: Overall cognitive status: Within functional limits for tasks assessed  POSTURE: No Significant postural limitations  PALPATION: Tender points in left upper trap and levator scap muscles; fascial restriction suboccipitals   CERVICAL ROM:   Active ROM A/PROM (deg) eval 4/3 5/1  Flexion 45  47  Extension 50  40  Right lateral flexion 24 34 25  Left lateral flexion 34 27 20  Right rotation 43 50 45  Left rotation 18 27 22    (Blank rows = not tested)  Deep cervical flexor and extensors strength 4/5  UPPER EXTREMITY ROM: bil shoulder flexion and abduction 155 degrees, internal rotation T8  UPPER EXTREMITY MMT: glenohumeral strength grossly 4+/5. Scapular depressors and retractors 4-/5  CERVICAL SPECIAL TESTS: + distraction  TREATMENT DATE: 01/09/24 Nu-step L3 7 min 2 rounds: 1# front raises 5x 1# lateral raises 5x 3# bicep curls 10x Shrugs 5# 10x Triceps overhead 1# 10x Cable row 10# 10x  Wall push ups 10x Manual therapy: soft tissue mobilization to left upper trap, levator scap, cervical paraspinals Trigger Point Dry Needling Subsequent Treatment: Instructions provided previously at initial dry needling treatment.  Patient Verbal Consent Given: Yes Education Handout Provided: Previously Provided Muscles Treated: left only: upper trap Electrical Stimulation Performed: No Treatment Response/Outcome: improved soft tissue mobility and decreased tender point size/number Heat 3 min   TREATMENT DATE: 01/03/24 Encourage hydration pre and post exercise  Discussion of response to Silver Sneakers Review  of shoulder 3 ways 1#;  (recommend keeping the weight at 1# for several weeks before increasing) Encouraged cervical SNAG rotation Manual therapy:  soft tissue mobilization to left upper trap, levator scap, cervical paraspinals Trigger Point Dry Needling Subsequent Treatment: Instructions provided previously at initial dry needling treatment.  Patient Verbal Consent Given: Yes Education Handout Provided: Previously Provided Muscles Treated: left only: upper trap, levator scap, cervical multifidi, suboccipitals  Electrical Stimulation Performed: No Treatment Response/Outcome: improved soft tissue mobility and decreased tender point size/number    TREATMENT DATE: 12/27/23 Counter top push ups for UE weight bearing 10x 1# standing shoulder 3 ways: flexion, scaption, abduction 10x each (added to HEP) Standing overhead press 10x 1# Bent row 3# 10x (added to HEP) Manual therapy: soft tissue mobilization to left upper trap, levator scap, cervical paraspinals Trigger Point Dry Needling Subsequent Treatment: Instructions provided previously at initial dry needling treatment.  Patient Verbal Consent Given: Yes Education Handout Provided: Previously Provided Muscles Treated: left only: upper trap, levator scap, cervical multifidi, suboccipitals  Electrical Stimulation Performed: No Treatment Response/Outcome: improved soft tissue mobility and decreased tender point size/number Heat 2 min TREATMENT DATE: 12/20/23 Snag cervical rotation with towel verbal and tactile cues 8x right/left Upper strap stretch with overpressure 30 sec holds 3x Levator scap stretch with overpressure 30 sec holds 3x  Prone: I T Y W with head lift 5x  Discussion of osteopenia (review of DEXA report= osteopenia in lumbar spine and mildly in femur) Counter top push ups for UE weight bearing  Manual therapy: soft tissue mobilization to left upper trap, levator scap, cervical paraspinals Trigger Point Dry Needling Subsequent Treatment: Instructions provided previously at initial dry needling treatment.  Patient Verbal Consent Given: Yes Education Handout Provided:  Previously Provided Muscles Treated: left only: upper trap, levator scap, cervical multifidi, suboccipitals  Electrical Stimulation Performed: No Treatment Response/Outcome: improved soft tissue mobility and decreased tender point size/number Heat 2 min   PATIENT EDUCATION:  Education details: Educated patient on anatomy and physiology of current symptoms, prognosis, plan of care as well as initial self care strategies to promote recovery Person educated: Patient Education method: Explanation Education comprehension: verbalized understanding  HOME EXERCISE PROGRAM: Access Code: [email or web address] URL: [email or web address] Date: 12/27/2023 Prepared by: Darien Eden  Exercises - Seated Upper Trapezius Stretch  - 1 x daily - 7 x weekly - 1 sets - 2-3 reps - 30 hold - Seated Levator Scapulae Stretch  - 1 x daily - 7 x weekly - 1 sets - 2-3 reps - 30 hold - Seated Assisted Cervical Rotation with Towel  - 1 x daily - 7 x weekly - 1 sets - 8 reps - Seated Scapular Retraction  - 1 x daily - 7 x weekly - 1 sets - 10 reps - Standing Row with Anchored Resistance  - 1 x daily - 7 x weekly - 2 sets - 10 reps - Shoulder extension with resistance - Neutral  - 1 x daily - 7 x weekly - 2 sets - 10 reps - Prone Scapular Slide with Shoulder Extension  - 1 x daily - 7 x weekly - 1 sets - 5 reps - Prone Shoulder Horizontal Abduction with Thumbs Up  - 1 x daily - 7 x weekly - 1 sets - 5 reps - Prone W Scapular Retraction  - 1 x daily - 7 x weekly - 1 sets - 5 reps - Prone Single Arm Shoulder Y  - 1 x daily - 7 x weekly - 1  sets - 5 reps - Standing Shoulder Flexion to 90 Degrees with Dumbbells  - 1 x daily - 7 x weekly - 1 sets - 10 reps - Scaption with Dumbbells  - 1 x daily - 7 x weekly - 1 sets - 10 reps - Standing Shoulder Horizontal Abduction with Dumbbells - Thumbs Up  - 1 x daily - 7 x weekly - 1 sets - 10 reps - Bent Over Single Arm Shoulder Row with Dumbbell  - 1 x daily - 7 x weekly - 1  sets - 10 reps ASSESSMENT:  CLINICAL IMPRESSION: Good response to circuit style UE exercise with no worsening of symptoms. Pistoning DN technique used for localized blood flow, tissue changes with nervous system accomodation and  prompting of inflammatory cascade and tissue healing. Improved soft tissue length noted at the end of session. Therapist monitoring response throughout session and with all interventions.    OBJECTIVE IMPAIRMENTS: decreased activity tolerance, decreased ROM, decreased strength, increased fascial restrictions, impaired perceived functional ability, and pain.   ACTIVITY LIMITATIONS: carrying, lifting, sleeping, and caring for others  PARTICIPATION LIMITATIONS: cleaning, laundry, driving, and occupation  PERSONAL FACTORS: Time since onset of injury/illness/exacerbation and 1 comorbidity: osteopenia are also affecting patient's functional outcome.   REHAB POTENTIAL: Good  CLINICAL DECISION MAKING: Stable/uncomplicated  EVALUATION COMPLEXITY: Low   GOALS: Goals reviewed with patient? Yes  SHORT TERM GOALS: Target date: 11/15/2023   The patient will demonstrate knowledge of basic self care strategies and exercises to promote healing  Baseline:  Goal status: met 4/3  2.  The patient will report a 30% improvement in neck and headache pain levels with functional activities which are currently difficult including turning her head, going to sleep and first thing in the AM Baseline:  Goal status: met 4/3 3.  Improved cervical rotation to 50 degrees needed for driving Baseline:  Goal status:  partially met 4/3   LONG TERM GOALS: Target date: 02/07/2024    The patient will be independent in a safe self progression of a home exercise program to promote further recovery of function  Baseline:  Goal status: INITIAL  2.  The patient will report a 60% improvement in neck and headache pain levels with functional activities which are currently difficult including  turning her head, going to sleep and first thing in the AM Baseline:  Goal status: INITIAL  3.  Cervical and scapular strength grossly 4+/5 needed for carrying shopping bags and luggage Baseline:  Goal status: INITIAL  4.  Cervical sidebending to 40 degrees and rotation to 55 degrees needed for driving Baseline:  Goal status: INITIAL  5.  Neck Disability Index improved to    24 % indicating improved function with less pain Baseline:  Goal status: INITIAL    PLAN:  PT FREQUENCY: 1x/week  PT DURATION: 8 weeks  PLANNED INTERVENTIONS: 97164- PT Re-evaluation, 97110-Therapeutic exercises, 97530- Therapeutic activity, 97112- Neuromuscular re-education, 97535- Self Care, 37106- Manual therapy, [phone]- Aquatic Therapy, [id number]- Electrical stimulation (unattended), [phone]- Electrical stimulation (manual), [id number]- Ultrasound, 93818- Traction (mechanical), [id number]- Ionotophoresis 4mg /ml Dexamethasone , Patient/Family education, Taping, Dry Needling, Joint mobilization, Spinal mobilization, Cryotherapy, and Moist heat  PLAN FOR NEXT SESSION:   shoulder 3 ways 1#; scapular series in prone; assess response to DN left upper trap, cervical multifidi, levator scap and suboccipitals; try wall ex's  Darien Eden, PT 01/09/24 4:27 PM Phone: [phone] Fax: [phone]

## 2024-01-15 ENCOUNTER — Ambulatory Visit: Admitting: Physical Therapy

## 2024-01-22 ENCOUNTER — Ambulatory Visit: Attending: Internal Medicine | Admitting: Physical Therapy

## 2024-01-22 DIAGNOSIS — M542 Cervicalgia: Secondary | ICD-10-CM | POA: Insufficient documentation

## 2024-01-22 DIAGNOSIS — G4486 Cervicogenic headache: Secondary | ICD-10-CM | POA: Diagnosis not present

## 2024-01-22 DIAGNOSIS — M6281 Muscle weakness (generalized): Secondary | ICD-10-CM | POA: Diagnosis not present

## 2024-01-22 NOTE — Therapy (Signed)
 OUTPATIENT PHYSICAL THERAPY CERVICAL PROGRESS NOTE   Patient Name: Megan Greer MRN: [id number] DOB:03-Aug-1956, 68 y.o., female Today's Date: 01/22/2024  END OF SESSION:  PT End of Session - 01/22/24 1403     Visit Number 11    Number of Visits 16    Date for PT Re-Evaluation 02/07/24    Authorization Type [employer] Medicare  8 + 8 visits  5/2-6/27    PT Start Time 1400    PT Stop Time 1440    PT Time Calculation (min) 40 min    Activity Tolerance Patient tolerated treatment well             Past Medical History:  Diagnosis Date   Anxiety    Diverticulosis    GERD (gastroesophageal reflux disease)    Heart murmur    History of obesity    Hypercholesteremia    Hyperlipidemia    Insomnia    Mitral valve regurgitation    mild   Prediabetes    PVC (premature ventricular contraction)    Serrated adenoma of colon 2015   Sinusitis    Past Surgical History:  Procedure Laterality Date   BUNIONECTOMY     Right foot    DILATION AND CURETTAGE OF UTERUS     Patient Active Problem List   Diagnosis Date Noted   Abdominal pain, chronic, right lower quadrant 08/13/2013   Hypothyroidism 05/06/2013   Transaminitis 01/18/2012   Anxiety and depression 11/17/2011   Preventative health care 11/17/2011   Osteopenia 11/17/2011   Chronic insomnia 11/17/2011   Hyperlipidemia 11/17/2011   Gastroenteritis 11/17/2011    PCP: Charlene Conners MD  REFERRING PROVIDER: Charlene Conners MD  REFERRING DIAG: M54.2 Neck pain  THERAPY DIAG:  Neck pain; weakness; cervicogenic headache  Rationale for Evaluation and Treatment: Rehabilitation  ONSET DATE: 1 year ago  SUBJECTIVE:                                                                                                                                                                                                         SUBJECTIVE STATEMENT: I feel so much better.  Every now and then I feel it when sleeping but I can  stretch it out.      PERTINENT HISTORY:   Had DN for hip in the past (hip pain has fully resolved) Osteopenia Knee meniscal debridement  PAIN:  Are you having pain? Yes NPRS scale: with movement 2-3, no pain if still  Pain location: left neck, upper trap, headaches Pain orientation: Left  PAIN TYPE: aching Pain description:  constant  Aggravating factors: takes Tylenol  on nightly basis to get to sleep, hurts in the AM; turning to the left > right (limited); sidebending right/left Relieving factors: Tylenol , Voltaren, heat  PRECAUTIONS: None   WEIGHT BEARING RESTRICTIONS: No  FALLS:  Has patient fallen in last 6 months? No LIVING ENVIRONMENT: Lives with: lives with their spouse Lives in: House/apartment Stairs: ranch home but some steps to higher room    OCCUPATION: works 2x/week  (nursing)   PLOF: Independent  PATIENT GOALS: lessen pain; engage in more activities; better quality of life  NEXT MD VISIT: as needed  OBJECTIVE:  Note: Objective measures were completed at Evaluation unless otherwise noted.  DIAGNOSTIC FINDINGS:  Had U/S on neck slight Dr. Laquita Plant did x-ray of shoulder saw OA "might need replacement in future"  Has been referred to endocrinologist for osteoporosis (appt next week) ASSESSMENT: Feb 2025: The BMD measured at AP Spine L1-L3 is 0.909 g/cm2 with a T-score of -2.2. This patient is considered osteopenic/low bone mass according to World Health Organization [facility]) criteria.   The quality of the exam is good. L4 was excluded due to degenerative changes.   Site Region Measured Date Measured Age YA BMD Significant CHANGE T-score AP Spine  L1-L3      10/02/2023    [age]         -2.1    0.909 g/cm2   DualFemur Neck Right 10/02/2023    67.5         -1.3    0.860 g/cm2   DualFemur Total Mean 10/02/2023    68.5         -0.8    0.906 g/cm2   World Health Organization [facility] criteria for post-menopausal, Caucasian Women: Normal       T-score at or  above -1 SD Osteopenia   T-score between -1 and -2.5 SD Osteoporosis T-score at or below -2.5 SD  PATIENT SURVEYS:  NDI 32% 12/13/23:  50%  COGNITION: Overall cognitive status: Within functional limits for tasks assessed  POSTURE: No Significant postural limitations  PALPATION: Tender points in left upper trap and levator scap muscles; fascial restriction suboccipitals   CERVICAL ROM:   Active ROM A/PROM (deg) eval 4/3 5/1  Flexion 45  47  Extension 50  40  Right lateral flexion 24 34 25  Left lateral flexion 34 27 20  Right rotation 43 50 45  Left rotation 18 27 22    (Blank rows = not tested)  Deep cervical flexor and extensors strength 4/5  UPPER EXTREMITY ROM: bil shoulder flexion and abduction 155 degrees, internal rotation T8  UPPER EXTREMITY MMT: glenohumeral strength grossly 4+/5. Scapular depressors and retractors 4-/5  CERVICAL SPECIAL TESTS: + distraction    TREATMENT DATE: 01/22/24 Nu-step L5 8 min while discussing status 2 rounds: 1# front raises 10x 1# lateral raises 10x Triceps overhead 1# 10x 3# alternating bicep curls 10x Overhead press 3# 5x right/left Shrugs 5# 10x Cable row 10# 10x  Counter push ups 10x (2nd set feet closer) Lat pulls 30# 8 reps Back to wall: bil UE elevation with coordinated breathing 8x;  snow angels 8x   TREATMENT DATE: 01/09/24 Nu-step L3 8 min 2 rounds: 1# front raises 5x 1# lateral raises 5x 3# bicep curls 10x Shrugs 5# 10x Triceps overhead 1# 10x Cable row 10# 10x  Wall push ups 10x Manual therapy: soft tissue mobilization to left upper trap, levator scap, cervical paraspinals Trigger Point Dry Needling Subsequent Treatment: Instructions provided previously at initial dry  needling treatment.  Patient Verbal Consent Given: Yes Education Handout Provided: Previously Provided Muscles Treated: left only: upper trap Electrical Stimulation Performed: No Treatment Response/Outcome: improved soft tissue mobility and  decreased tender point size/number Heat 3 min   TREATMENT DATE: 01/03/24 Encourage hydration pre and post exercise  Discussion of response to Silver Sneakers Review of shoulder 3 ways 1#;  (recommend keeping the weight at 1# for several weeks before increasing) Encouraged cervical SNAG rotation Manual therapy: soft tissue mobilization to left upper trap, levator scap, cervical paraspinals Trigger Point Dry Needling Subsequent Treatment: Instructions provided previously at initial dry needling treatment.  Patient Verbal Consent Given: Yes Education Handout Provided: Previously Provided Muscles Treated: left only: upper trap, levator scap, cervical multifidi, suboccipitals  Electrical Stimulation Performed: No Treatment Response/Outcome: improved soft tissue mobility and decreased tender point size/number    TREATMENT DATE: 12/27/23 Counter top push ups for UE weight bearing 10x 1# standing shoulder 3 ways: flexion, scaption, abduction 10x each (added to HEP) Standing overhead press 10x 1# Bent row 3# 10x (added to HEP) Manual therapy: soft tissue mobilization to left upper trap, levator scap, cervical paraspinals Trigger Point Dry Needling Subsequent Treatment: Instructions provided previously at initial dry needling treatment.  Patient Verbal Consent Given: Yes Education Handout Provided: Previously Provided Muscles Treated: left only: upper trap, levator scap, cervical multifidi, suboccipitals  Electrical Stimulation Performed: No Treatment Response/Outcome: improved soft tissue mobility and decreased tender point size/number Heat 2 min   PATIENT EDUCATION:  Education details: Educated patient on anatomy and physiology of current symptoms, prognosis, plan of care as well as initial self care strategies to promote recovery Person educated: Patient Education method: Explanation Education comprehension: verbalized understanding  HOME EXERCISE PROGRAM: Access Code: [email or web address] URL:  [email or web address] Date: 12/27/2023 Prepared by: Darien Eden  Exercises - Seated Upper Trapezius Stretch  - 1 x daily - 7 x weekly - 1 sets - 2-3 reps - 30 hold - Seated Levator Scapulae Stretch  - 1 x daily - 7 x weekly - 1 sets - 2-3 reps - 30 hold - Seated Assisted Cervical Rotation with Towel  - 1 x daily - 7 x weekly - 1 sets - 8 reps - Seated Scapular Retraction  - 1 x daily - 7 x weekly - 1 sets - 10 reps - Standing Row with Anchored Resistance  - 1 x daily - 7 x weekly - 2 sets - 10 reps - Shoulder extension with resistance - Neutral  - 1 x daily - 7 x weekly - 2 sets - 10 reps - Prone Scapular Slide with Shoulder Extension  - 1 x daily - 7 x weekly - 1 sets - 5 reps - Prone Shoulder Horizontal Abduction with Thumbs Up  - 1 x daily - 7 x weekly - 1 sets - 5 reps - Prone W Scapular Retraction  - 1 x daily - 7 x weekly - 1 sets - 5 reps - Prone Single Arm Shoulder Y  - 1 x daily - 7 x weekly - 1 sets - 5 reps - Standing Shoulder Flexion to 90 Degrees with Dumbbells  - 1 x daily - 7 x weekly - 1 sets - 10 reps - Scaption with Dumbbells  - 1 x daily - 7 x weekly - 1 sets - 10 reps - Standing Shoulder Horizontal Abduction with Dumbbells - Thumbs Up  - 1 x daily - 7 x weekly - 1 sets - 10 reps - Bent Over Single Arm Shoulder Row  with Dumbbell  - 1 x daily - 7 x weekly - 1 sets - 10 reps ASSESSMENT:  CLINICAL IMPRESSION: The patient has been less symptomatic over past 2 weeks suggesting improved pain control as strength progresses.  Able to increase challenge level of ex's without pain exacerbation.  Therapist providing verbal cues to optimize technique with  exercises in order to achieve the greatest benefit.  Anticipate she will meet remaining goals in the next 2 visits.       OBJECTIVE IMPAIRMENTS: decreased activity tolerance, decreased ROM, decreased strength, increased fascial restrictions, impaired perceived functional ability, and pain.   ACTIVITY LIMITATIONS:  carrying, lifting, sleeping, and caring for others  PARTICIPATION LIMITATIONS: cleaning, laundry, driving, and occupation  PERSONAL FACTORS: Time since onset of injury/illness/exacerbation and 1 comorbidity: osteopenia are also affecting patient's functional outcome.   REHAB POTENTIAL: Good  CLINICAL DECISION MAKING: Stable/uncomplicated  EVALUATION COMPLEXITY: Low   GOALS: Goals reviewed with patient? Yes  SHORT TERM GOALS: Target date: 11/15/2023   The patient will demonstrate knowledge of basic self care strategies and exercises to promote healing  Baseline:  Goal status: met 4/3  2.  The patient will report a 30% improvement in neck and headache pain levels with functional activities which are currently difficult including turning her head, going to sleep and first thing in the AM Baseline:  Goal status: met 4/3 3.  Improved cervical rotation to 50 degrees needed for driving Baseline:  Goal status:  partially met 4/3   LONG TERM GOALS: Target date: 02/07/2024    The patient will be independent in a safe self progression of a home exercise program to promote further recovery of function  Baseline:  Goal status: INITIAL  2.  The patient will report a 60% improvement in neck and headache pain levels with functional activities which are currently difficult including turning her head, going to sleep and first thing in the AM Baseline:  Goal status: INITIAL  3.  Cervical and scapular strength grossly 4+/5 needed for carrying shopping bags and luggage Baseline:  Goal status: INITIAL  4.  Cervical sidebending to 40 degrees and rotation to 55 degrees needed for driving Baseline:  Goal status: INITIAL  5.  Neck Disability Index improved to    24 % indicating improved function with less pain Baseline:  Goal status: INITIAL    PLAN:  PT FREQUENCY: 1x/week  PT DURATION: 8 weeks  PLANNED INTERVENTIONS: 97164- PT Re-evaluation, 97110-Therapeutic exercises, 97530-  Therapeutic activity, 97112- Neuromuscular re-education, 97535- Self Care, 16109- Manual therapy, [phone]- Aquatic Therapy, U9811- Electrical stimulation (unattended), [phone]- Electrical stimulation (manual), [id number]- Ultrasound, 29562- Traction (mechanical), [id number]- Ionotophoresis 4mg /ml Dexamethasone , Patient/Family education, Taping, Dry Needling, Joint mobilization, Spinal mobilization, Cryotherapy, and Moist heat  PLAN FOR NEXT SESSION:   shoulder 3 ways 1#; scapular series in prone; assess response to DN left upper trap, cervical multifidi, levator scap and suboccipitals;  wall ex's  Darien Eden, PT 01/22/24 5:35 PM Phone: [phone] Fax: [phone]

## 2024-01-29 ENCOUNTER — Other Ambulatory Visit (HOSPITAL_COMMUNITY): Payer: Self-pay

## 2024-01-29 MED ORDER — ROSUVASTATIN CALCIUM 10 MG PO TABS
10.0000 mg | ORAL_TABLET | Freq: Every morning | ORAL | 0 refills | Status: DC
  Filled 2024-01-29: qty 90, 90d supply, fill #0

## 2024-01-31 ENCOUNTER — Ambulatory Visit: Admitting: Physical Therapy

## 2024-01-31 DIAGNOSIS — M6281 Muscle weakness (generalized): Secondary | ICD-10-CM

## 2024-01-31 DIAGNOSIS — M542 Cervicalgia: Secondary | ICD-10-CM

## 2024-01-31 DIAGNOSIS — G4486 Cervicogenic headache: Secondary | ICD-10-CM | POA: Diagnosis not present

## 2024-01-31 NOTE — Therapy (Signed)
 OUTPATIENT PHYSICAL THERAPY CERVICAL PROGRESS NOTE   Patient Name: Megan Greer MRN: [id number] DOB:Jan 15, 1956, 68 y.o., female Today's Date: 01/31/2024  END OF SESSION:  PT End of Session - 01/31/24 1400     Visit Number 12    Number of Visits 16    Date for PT Re-Evaluation 02/07/24    Authorization Type [employer] Medicare  8 + 8 visits  5/2-6/27    PT Start Time 1400    PT Stop Time 1439    PT Time Calculation (min) 39 min    Activity Tolerance Patient tolerated treatment well          Past Medical History:  Diagnosis Date   Anxiety    Diverticulosis    GERD (gastroesophageal reflux disease)    Heart murmur    History of obesity    Hypercholesteremia    Hyperlipidemia    Insomnia    Mitral valve regurgitation    mild   Prediabetes    PVC (premature ventricular contraction)    Serrated adenoma of colon 2015   Sinusitis    Past Surgical History:  Procedure Laterality Date   BUNIONECTOMY     Right foot    DILATION AND CURETTAGE OF UTERUS     Patient Active Problem List   Diagnosis Date Noted   Abdominal pain, chronic, right lower quadrant 08/13/2013   Hypothyroidism 05/06/2013   Transaminitis 01/18/2012   Anxiety and depression 11/17/2011   Preventative health care 11/17/2011   Osteopenia 11/17/2011   Chronic insomnia 11/17/2011   Hyperlipidemia 11/17/2011   Gastroenteritis 11/17/2011    PCP: Charlene Conners MD  REFERRING PROVIDER: Charlene Conners MD  REFERRING DIAG: M54.2 Neck pain  THERAPY DIAG:  Neck pain; weakness; cervicogenic headache  Rationale for Evaluation and Treatment: Rehabilitation  ONSET DATE: 1 year ago  SUBJECTIVE:                                                                                                                                                                                                         SUBJECTIVE STATEMENT: The neck muscles are better.  Did well with the exercises except I did have some  shoulder pain.  I have a lot going on, I may be moving. PERTINENT HISTORY:   Had DN for hip in the past (hip pain has fully resolved) Osteopenia Knee meniscal debridement  PAIN:  Are you having pain? Yes NPRS scale: with movement 2-3, no pain if still  Pain location: left neck, upper trap, headaches Pain orientation: Left  PAIN TYPE: aching Pain description:  constant  Aggravating factors: takes Tylenol  on nightly basis to get to sleep, hurts in the AM; turning to the left > right (limited); sidebending right/left Relieving factors: Tylenol , Voltaren, heat  PRECAUTIONS: None   WEIGHT BEARING RESTRICTIONS: No  FALLS:  Has patient fallen in last 6 months? No LIVING ENVIRONMENT: Lives with: lives with their spouse Lives in: House/apartment Stairs: ranch home but some steps to higher room    OCCUPATION: works 2x/week  (nursing)   PLOF: Independent  PATIENT GOALS: lessen pain; engage in more activities; better quality of life  NEXT MD VISIT: as needed  OBJECTIVE:  Note: Objective measures were completed at Evaluation unless otherwise noted.  DIAGNOSTIC FINDINGS:  Had U/S on neck slight Dr. Laquita Plant did x-ray of shoulder saw OA might need replacement in future  Has been referred to endocrinologist for osteoporosis (appt next week) ASSESSMENT: Feb 2025: The BMD measured at [facility] Spine L1-L3 is 0.909 g/cm2 with a T-score of -2.2. This patient is considered osteopenic/low bone mass according to World Health Organization [facility]) criteria.   The quality of the exam is good. L4 was excluded due to degenerative changes.   Site Region Measured Date Measured Age YA BMD Significant CHANGE T-score AP Spine  L1-L3      10/02/2023    67.5         -2.1    0.909 g/cm2   DualFemur Neck Right 10/02/2023    68.5         -1.3    0.860 g/cm2   DualFemur Total Mean 10/02/2023    68.5         -0.8    0.906 g/cm2   World Health Organization [facility]) criteria for post-menopausal, Caucasian  Women: Normal       T-score at or above -1 SD Osteopenia   T-score between -1 and -2.5 SD Osteoporosis T-score at or below -2.5 SD  PATIENT SURVEYS:  NDI 32% 12/13/23:  50%  COGNITION: Overall cognitive status: Within functional limits for tasks assessed  POSTURE: No Significant postural limitations  PALPATION: Tender points in left upper trap and levator scap muscles; fascial restriction suboccipitals   CERVICAL ROM:   Active ROM A/PROM (deg) eval 4/3 5/1  Flexion 45  47  Extension 50  40  Right lateral flexion 24 34 25  Left lateral flexion 34 27 20  Right rotation 43 50 45  Left rotation 18 27 22    (Blank rows = not tested)  Deep cervical flexor and extensors strength 4/5  UPPER EXTREMITY ROM: bil shoulder flexion and abduction 155 degrees, internal rotation T8  UPPER EXTREMITY MMT: glenohumeral strength grossly 4+/5. Scapular depressors and retractors 4-/5  CERVICAL SPECIAL TESTS: + distraction  TREATMENT DATE: 01/31/24 Nu-step L5 12 min while discussing status 2 rounds: 1# 3 way shoulder raises 10x Triceps overhead bil skull crushers 2# 10x 3# alternating bicep curls 10x Overhead press 3# 10x right/left Shrugs 7# 10x 1 round: Back to wall: bil UE elevation with coordinated breathing 8x;  snow angels 8x (some shoulder discomfort with snow angels, cue for smaller ROM) Cable row 10# 10x  Wall push ups 10x  Lat pulls 30# 10 reps  TREATMENT DATE: 01/22/24 Nu-step L5 8 min while discussing status 2 rounds: 1# front raises 10x 1# lateral raises 10x Triceps overhead 1# 10x 3# alternating bicep curls 10x Overhead press 3# 5x right/left Shrugs 5# 10x Cable row 10# 10x  Counter push ups 10x (2nd set feet closer) Lat pulls 30# 8 reps  Back to wall: bil UE elevation with coordinated breathing 8x;  snow angels 8x   TREATMENT DATE: 01/09/24 Nu-step L3 8 min 2 rounds: 1# front raises 5x 1# lateral raises 5x 3# bicep curls 10x Shrugs 5# 10x Triceps overhead 1#  10x Cable row 10# 10x  Wall push ups 10x Manual therapy: soft tissue mobilization to left upper trap, levator scap, cervical paraspinals Trigger Point Dry Needling Subsequent Treatment: Instructions provided previously at initial dry needling treatment.  Patient Verbal Consent Given: Yes Education Handout Provided: Previously Provided Muscles Treated: left only: upper trap Electrical Stimulation Performed: No Treatment Response/Outcome: improved soft tissue mobility and decreased tender point size/number Heat 3 min        PATIENT EDUCATION:  Education details: Educated patient on anatomy and physiology of current symptoms, prognosis, plan of care as well as initial self care strategies to promote recovery Person educated: Patient Education method: Explanation Education comprehension: verbalized understanding  HOME EXERCISE PROGRAM: Access Code: [email or web address] URL: [email or web address] Date: 12/27/2023 Prepared by: Darien Eden  Exercises - Seated Upper Trapezius Stretch  - 1 x daily - 7 x weekly - 1 sets - 2-3 reps - 30 hold - Seated Levator Scapulae Stretch  - 1 x daily - 7 x weekly - 1 sets - 2-3 reps - 30 hold - Seated Assisted Cervical Rotation with Towel  - 1 x daily - 7 x weekly - 1 sets - 8 reps - Seated Scapular Retraction  - 1 x daily - 7 x weekly - 1 sets - 10 reps - Standing Row with Anchored Resistance  - 1 x daily - 7 x weekly - 2 sets - 10 reps - Shoulder extension with resistance - Neutral  - 1 x daily - 7 x weekly - 2 sets - 10 reps - Prone Scapular Slide with Shoulder Extension  - 1 x daily - 7 x weekly - 1 sets - 5 reps - Prone Shoulder Horizontal Abduction with Thumbs Up  - 1 x daily - 7 x weekly - 1 sets - 5 reps - Prone W Scapular Retraction  - 1 x daily - 7 x weekly - 1 sets - 5 reps - Prone Single Arm Shoulder Y  - 1 x daily - 7 x weekly - 1 sets - 5 reps - Standing Shoulder Flexion to 90 Degrees with Dumbbells  - 1 x daily - 7 x weekly - 1  sets - 10 reps - Scaption with Dumbbells  - 1 x daily - 7 x weekly - 1 sets - 10 reps - Standing Shoulder Horizontal Abduction with Dumbbells - Thumbs Up  - 1 x daily - 7 x weekly - 1 sets - 10 reps - Bent Over Single Arm Shoulder Row with Dumbbell  - 1 x daily - 7 x weekly - 1 sets - 10 reps ASSESSMENT:  CLINICAL IMPRESSION: Modifications to exercises secondary to shoulder pain (primarily shoulder abduction).  The patient has been less symptomatic over past 2 weeks suggesting improved pain control as strength progresses.  Anticipate readiness for discharge from PT to an independent HEP next session.        OBJECTIVE IMPAIRMENTS: decreased activity tolerance, decreased ROM, decreased strength, increased fascial restrictions, impaired perceived functional ability, and pain.   ACTIVITY LIMITATIONS: carrying, lifting, sleeping, and caring for others  PARTICIPATION LIMITATIONS: cleaning, laundry, driving, and occupation  PERSONAL FACTORS: Time since onset of injury/illness/exacerbation and 1 comorbidity: osteopenia are also affecting patient's functional outcome.   REHAB POTENTIAL: Good  CLINICAL DECISION MAKING: Stable/uncomplicated  EVALUATION COMPLEXITY: Low   GOALS: Goals reviewed with patient? Yes  SHORT TERM GOALS: Target date: 11/15/2023   The patient will demonstrate knowledge of basic self care strategies and exercises to promote healing  Baseline:  Goal status: met 4/3  2.  The patient will report a 30% improvement in neck and headache pain levels with functional activities which are currently difficult including turning her head, going to sleep and first thing in the AM Baseline:  Goal status: met 4/3 3.  Improved cervical rotation to 50 degrees needed for driving Baseline:  Goal status:  partially met 4/3   LONG TERM GOALS: Target date: 02/07/2024    The patient will be independent in a safe self progression of a home exercise program to promote further recovery  of function  Baseline:  Goal status: ongoing  2.  The patient will report a 60% improvement in neck and headache pain levels with functional activities which are currently difficult including turning her head, going to sleep and first thing in the AM Baseline:  Goal status: ongoing  3.  Cervical and scapular strength grossly 4+/5 needed for carrying shopping bags and luggage Baseline:  Goal status: ongoing  4.  Cervical sidebending to 40 degrees and rotation to 55 degrees needed for driving Baseline:  Goal status: ongoing  5.  Neck Disability Index improved to    24 % indicating improved function with less pain Baseline:  Goal status: ongoing    PLAN:  PT FREQUENCY: 1x/week  PT DURATION: 8 weeks  PLANNED INTERVENTIONS: 97164- PT Re-evaluation, 97110-Therapeutic exercises, 97530- Therapeutic activity, 97112- Neuromuscular re-education, 97535- Self Care, 16109- Manual therapy, [phone]- Aquatic Therapy, [phone]- Electrical stimulation (unattended), [phone]- Electrical stimulation (manual), [id number]- Ultrasound, 29562- Traction (mechanical), [id number]- Ionotophoresis 4mg /ml Dexamethasone , Patient/Family education, Taping, Dry Needling, Joint mobilization, Spinal mobilization, Cryotherapy, and Moist heat  PLAN FOR NEXT SESSION: NDI; cervical ROM; check remaining goals;  shoulder 3 ways 1#; scapular series in prone; assess response to DN left upper trap, cervical multifidi, levator scap and suboccipitals;  wall ex's  Darien Eden, PT 01/31/24 5:23 PM Phone: [phone] Fax: [phone]

## 2024-02-05 ENCOUNTER — Ambulatory Visit: Admitting: Physical Therapy

## 2024-02-05 ENCOUNTER — Other Ambulatory Visit (HOSPITAL_COMMUNITY): Payer: Self-pay

## 2024-02-05 DIAGNOSIS — G4486 Cervicogenic headache: Secondary | ICD-10-CM | POA: Diagnosis not present

## 2024-02-05 DIAGNOSIS — M542 Cervicalgia: Secondary | ICD-10-CM

## 2024-02-05 DIAGNOSIS — M6281 Muscle weakness (generalized): Secondary | ICD-10-CM | POA: Diagnosis not present

## 2024-02-05 MED ORDER — PANTOPRAZOLE SODIUM 40 MG PO TBEC
40.0000 mg | DELAYED_RELEASE_TABLET | Freq: Every day | ORAL | 3 refills | Status: AC
  Filled 2024-02-05: qty 90, 90d supply, fill #0

## 2024-02-05 MED ORDER — FAMOTIDINE 40 MG PO TABS
40.0000 mg | ORAL_TABLET | Freq: Every day | ORAL | 3 refills | Status: AC
  Filled 2024-02-05: qty 90, 90d supply, fill #0
  Filled 2024-04-18 – 2024-08-11 (×2): qty 90, 90d supply, fill #1

## 2024-02-05 NOTE — Therapy (Signed)
 OUTPATIENT PHYSICAL THERAPY CERVICAL PROGRESS NOTE/DISCHARGE SUMMARY   Patient Name: Megan Greer MRN: [id number] DOB:04/01/1956, 68 y.o., female Today's Date: 02/05/2024  END OF SESSION:  PT End of Session - 02/05/24 1101     Visit Number 13    Number of Visits 16    Date for PT Re-Evaluation 02/07/24    Authorization Type [employer] Medicare  8 + 8 visits  5/2-6/27    PT Start Time 1101    PT Stop Time 1133   PT Time Calculation (min) 32 min    Activity Tolerance Patient tolerated treatment well          Past Medical History:  Diagnosis Date   Anxiety    Diverticulosis    GERD (gastroesophageal reflux disease)    Heart murmur    History of obesity    Hypercholesteremia    Hyperlipidemia    Insomnia    Mitral valve regurgitation    mild   Prediabetes    PVC (premature ventricular contraction)    Serrated adenoma of colon 2015   Sinusitis    Past Surgical History:  Procedure Laterality Date   BUNIONECTOMY     Right foot    DILATION AND CURETTAGE OF UTERUS     Patient Active Problem List   Diagnosis Date Noted   Abdominal pain, chronic, right lower quadrant 08/13/2013   Hypothyroidism 05/06/2013   Transaminitis 01/18/2012   Anxiety and depression 11/17/2011   Preventative health care 11/17/2011   Osteopenia 11/17/2011   Chronic insomnia 11/17/2011   Hyperlipidemia 11/17/2011   Gastroenteritis 11/17/2011    PCP: Charlott Carrier MD  REFERRING PROVIDER: Charlott Carrier MD  REFERRING DIAG: M54.2 Neck pain  THERAPY DIAG:  Neck pain; weakness; cervicogenic headache  Rationale for Evaluation and Treatment: Rehabilitation  ONSET DATE: 1 year ago  SUBJECTIVE:                                                                                                                                                                                                         SUBJECTIVE STATEMENT: I have a summertime cold.  Overall my neck is better.  I think getting  stronger will help.  I'm 90% better.   PERTINENT HISTORY:   Had DN for hip in the past (hip pain has fully resolved) Osteopenia Knee meniscal debridement  PAIN:  Are you having pain? Yes NPRS scale: with movement 5/10 it's a little up there, no pain at rest Pain location: left neck, upper trap, headaches Pain orientation: Left  PAIN TYPE: aching Pain description: constant  Aggravating factors: takes Tylenol  on nightly basis to get to sleep, hurts in the AM; turning to the left > right (limited); sidebending right/left Relieving factors: Tylenol , Voltaren, heat  PRECAUTIONS: None   WEIGHT BEARING RESTRICTIONS: No  FALLS:  Has patient fallen in last 6 months? No LIVING ENVIRONMENT: Lives with: lives with their spouse Lives in: House/apartment Stairs: ranch home but some steps to higher room    OCCUPATION: works 2x/week  (nursing)   PLOF: Independent  PATIENT GOALS: lessen pain; engage in more activities; better quality of life  NEXT MD VISIT: as needed  OBJECTIVE:  Note: Objective measures were completed at Evaluation unless otherwise noted.  DIAGNOSTIC FINDINGS:  Had U/S on neck slight Dr. Evans did x-ray of shoulder saw OA might need replacement in future  Has been referred to endocrinologist for osteoporosis (appt next week) ASSESSMENT: Feb 2025: The BMD measured at AP Spine L1-L3 is 0.909 g/cm2 with a T-score of -2.2. This patient is considered osteopenic/low bone mass according to World Health Organization [facility]) criteria.   The quality of the exam is good. L4 was excluded due to degenerative changes.   Site Region Measured Date Measured Age YA BMD Significant CHANGE T-score AP Spine  L1-L3      10/02/20[age]         -2.2    0.909 g/cm2   DualFemur Neck Right 10/02/2023    68.4         -1.3    0.860 g/cm2   DualFemur Total Mean 10[age]02/2023    68.5         -0.7    0.906 g/cm2   World Health Organization [facility]) criteria for  post-menopausal, Caucasian Women: Normal       T-score at or above -1 SD Osteopenia   T-score between -1 and -2.5 SD Osteoporosis T-score at or below -2.5 SD  PATIENT SURVEYS:  NDI 32% 12/13/23:  50% 6/24:   14%  COGNITION: Overall cognitive status: Within functional limits for tasks assessed  POSTURE: No Significant postural limitations  PALPATION: Tender points in left upper trap and levator scap muscles; fascial restriction suboccipitals   CERVICAL ROM:   Active ROM A/PROM (deg) eval 4/3 5/1 6/24  Flexion 45  47 42  Extension 50  40 50  Right lateral flexion 24 34 25 35  Left lateral flexion 34 27 20 42  Right rotation 43 50 45 42  Left rotation 18 27 22 30    (Blank rows = not tested)  Deep cervical flexor and extensors strength 4/5 02/05/24: 4+/5  UPPER EXTREMITY ROM: bil shoulder flexion and abduction 155 degrees, internal rotation T8  UPPER EXTREMITY MMT: glenohumeral strength grossly 4+/5. Scapular depressors and retractors 4-/5  CERVICAL SPECIAL TESTS: + distraction    TREATMENT DATE: 02/05/24 NDI Status update and response to treatment  Cervical ROM Shoulder ROM  Discussion of HEP and safe self progression        TREATMENT DATE: 01/31/24 Nu-step L5 12 min while discussing status 2 rounds: 1# 3 way shoulder raises 10x Triceps overhead bil skull crushers 2# 10x 3# alternating bicep curls 10x Overhead press 3# 10x right/left Shrugs 7# 10x 1 round: Back to wall: bil UE elevation with coordinated breathing 8x;  snow angels 8x (some shoulder discomfort with snow angels, cue for smaller ROM) Cable row 10# 10x  Wall push ups 10x  Lat pulls 30# 10 reps  TREATMENT DATE: 01/22/24 Nu-step L5 8 min while discussing status 2 rounds:  1# front raises 10x 1# lateral raises 10x Triceps overhead 1# 10x 3# alternating bicep curls 10x Overhead press 3# 5x right/left Shrugs 5# 10x Cable row 10# 10x  Counter push ups 10x (2nd set feet closer) Lat pulls  30# 8 reps Back to wall: bil UE elevation with coordinated breathing 8x;  snow angels 8x    PATIENT EDUCATION:  Education details: Educated patient on anatomy and physiology of current symptoms, prognosis, plan of care as well as initial self care strategies to promote recovery Person educated: Patient Education method: Explanation Education comprehension: verbalized understanding  HOME EXERCISE PROGRAM: Access Code: [email or web address] URL: [email or web address] Date: 12/27/2023 Prepared by: Glade Pesa  Exercises - Seated Upper Trapezius Stretch  - 1 x daily - 7 x weekly - 1 sets - 2-3 reps - 30 hold - Seated Levator Scapulae Stretch  - 1 x daily - 7 x weekly - 1 sets - 2-3 reps - 30 hold - Seated Assisted Cervical Rotation with Towel  - 1 x daily - 7 x weekly - 1 sets - 8 reps - Seated Scapular Retraction  - 1 x daily - 7 x weekly - 1 sets - 10 reps - Standing Row with Anchored Resistance  - 1 x daily - 7 x weekly - 2 sets - 10 reps - Shoulder extension with resistance - Neutral  - 1 x daily - 7 x weekly - 2 sets - 10 reps - Prone Scapular Slide with Shoulder Extension  - 1 x daily - 7 x weekly - 1 sets - 5 reps - Prone Shoulder Horizontal Abduction with Thumbs Up  - 1 x daily - 7 x weekly - 1 sets - 5 reps - Prone W Scapular Retraction  - 1 x daily - 7 x weekly - 1 sets - 5 reps - Prone Single Arm Shoulder Y  - 1 x daily - 7 x weekly - 1 sets - 5 reps - Standing Shoulder Flexion to 90 Degrees with Dumbbells  - 1 x daily - 7 x weekly - 1 sets - 10 reps - Scaption with Dumbbells  - 1 x daily - 7 x weekly - 1 sets - 10 reps - Standing Shoulder Horizontal Abduction with Dumbbells - Thumbs Up  - 1 x daily - 7 x weekly - 1 sets - 10 reps - Bent Over Single Arm Shoulder Row with Dumbbell  - 1 x daily - 7 x weekly - 1 sets - 10 reps ASSESSMENT:  CLINICAL IMPRESSION: The patient has met the majority of rehab goals, with noted improvements in pain reduction, outcome score, ROM,  strength and functional mobility.  A comprehensive HEP has been established and anticipate further improvements over time with regular performance of the program.  Recommend discharge from PT at this time.     OBJECTIVE IMPAIRMENTS: decreased activity tolerance, decreased ROM, decreased strength, increased fascial restrictions, impaired perceived functional ability, and pain.   ACTIVITY LIMITATIONS: carrying, lifting, sleeping, and caring for others  PARTICIPATION LIMITATIONS: cleaning, laundry, driving, and occupation  PERSONAL FACTORS: Time since onset of injury/illness/exacerbation and 1 comorbidity: osteopenia are also affecting patient's functional outcome.   REHAB POTENTIAL: Good  CLINICAL DECISION MAKING: Stable/uncomplicated  EVALUATION COMPLEXITY: Low   GOALS: Goals reviewed with patient? Yes  SHORT TERM GOALS: Target date: 11/15/2023   The patient will demonstrate knowledge of basic self care strategies and exercises to promote healing  Baseline:  Goal status: met 4/3  2.  The patient will report a 30% improvement  in neck and headache pain levels with functional activities which are currently difficult including turning her head, going to sleep and first thing in the AM Baseline:  Goal status: met 4/3 3.  Improved cervical rotation to 50 degrees needed for driving Baseline:  Goal status:  partially met 4/3   LONG TERM GOALS: Target date: 02/07/2024    The patient will be independent in a safe self progression of a home exercise program to promote further recovery of function  Baseline:  Goal status: MET  2.  The patient will report a 60% improvement in neck and headache pain levels with functional activities which are currently difficult including turning her head, going to sleep and first thing in the AM Baseline:  Goal status: met   3.  Cervical and scapular strength grossly 4+/5 needed for carrying shopping bags and luggage Baseline:  Goal status: MET  4.   Cervical sidebending to 40 degrees and rotation to 55 degrees needed for driving Baseline:  Goal status: partially met  5.  Neck Disability Index improved to    24 % indicating improved function with less pain Baseline:  Goal status: MET    PLAN: PHYSICAL THERAPY DISCHARGE SUMMARY  Visits from Start of Care: 13  Current functional level related to goals / functional outcomes: See clinical impressions avoid   Remaining deficits: As above   Education / Equipment: HEP as above   Patient agrees to discharge. Patient goals were met. Patient is being discharged due to meeting the stated rehab goals.  Glade Pesa, PT 02/05/24 6:51 PM Phone: [phone] Fax: [phone]

## 2024-02-06 DIAGNOSIS — M542 Cervicalgia: Secondary | ICD-10-CM | POA: Diagnosis not present

## 2024-02-06 NOTE — Progress Notes (Signed)
 History of Present Illness The patient is a 68 year old female who presents for evaluation of neck pain.  Approximately two weeks ago, she experienced severe neck pain that was unusual for her. Typically, she perceives a sensation of pressure or fullness in the area rather than pain. The discomfort extended into her head. She also noticed what appeared to be swelling on one side of her neck, although her husband did not observe any difference. The pain persisted for about two days before subsiding without any specific treatment. She may have taken Tylenol  during this period.  Concurrently, she developed a significant toothache in the lower jaw two days ago, which she suspects might be related to her neck pain. She has a history of dental issues and was previously prescribed antibiotics by her dentist, which allowed her to avoid a root canal procedure. However, the problem seems to have recurred. She has an appointment with her dentist scheduled for tomorrow.  Physical Exam Neck: No enlarged lymph nodes palpated.  Area of previous pain is lateral to larynx on left side.  Assessment & Plan 1. Neck pain. Neck pain has shown improvement without any specific treatment. The ultrasound conducted in 09/2023 revealed a 0.3 cm node, which is within the normal range. There is no evidence of lymph node enlargement at present. The possibility of carotid artery pain was discussed, which could be indicative of an inflammatory condition or vasculitis. This would typically be managed with anti-inflammatory medications. A CT scan is not deemed necessary at this point. If the pain persists or worsens, a carotid Doppler ultrasound may be considered to evaluate the artery.  Results Imaging  - Ultrasound: 09/2023, 0.3 cm node, which is considered normal.

## 2024-02-13 DIAGNOSIS — M7742 Metatarsalgia, left foot: Secondary | ICD-10-CM | POA: Diagnosis not present

## 2024-02-13 DIAGNOSIS — M2042 Other hammer toe(s) (acquired), left foot: Secondary | ICD-10-CM | POA: Diagnosis not present

## 2024-02-20 ENCOUNTER — Other Ambulatory Visit: Payer: Self-pay | Admitting: Orthopedic Surgery

## 2024-02-25 ENCOUNTER — Other Ambulatory Visit (HOSPITAL_COMMUNITY): Payer: Self-pay

## 2024-02-25 DIAGNOSIS — K219 Gastro-esophageal reflux disease without esophagitis: Secondary | ICD-10-CM | POA: Diagnosis not present

## 2024-02-25 DIAGNOSIS — R21 Rash and other nonspecific skin eruption: Secondary | ICD-10-CM | POA: Diagnosis not present

## 2024-02-25 MED ORDER — PANTOPRAZOLE SODIUM 40 MG PO TBEC
40.0000 mg | DELAYED_RELEASE_TABLET | Freq: Every day | ORAL | 3 refills | Status: AC
  Filled 2024-02-25: qty 90, 90d supply, fill #0
  Filled 2024-07-06 – 2024-07-07 (×2): qty 90, 90d supply, fill #1

## 2024-02-25 MED ORDER — TRIAMCINOLONE ACETONIDE 0.5 % EX CREA
TOPICAL_CREAM | CUTANEOUS | 1 refills | Status: AC
  Filled 2024-02-25: qty 15, 14d supply, fill #0

## 2024-03-27 ENCOUNTER — Other Ambulatory Visit: Payer: Self-pay

## 2024-03-27 ENCOUNTER — Encounter (HOSPITAL_BASED_OUTPATIENT_CLINIC_OR_DEPARTMENT_OTHER): Payer: Self-pay | Admitting: Orthopedic Surgery

## 2024-03-31 DIAGNOSIS — Z0000 Encounter for general adult medical examination without abnormal findings: Secondary | ICD-10-CM | POA: Diagnosis not present

## 2024-03-31 DIAGNOSIS — M8580 Other specified disorders of bone density and structure, unspecified site: Secondary | ICD-10-CM | POA: Diagnosis not present

## 2024-03-31 DIAGNOSIS — R7303 Prediabetes: Secondary | ICD-10-CM | POA: Diagnosis not present

## 2024-03-31 DIAGNOSIS — Z79899 Other long term (current) drug therapy: Secondary | ICD-10-CM | POA: Diagnosis not present

## 2024-03-31 DIAGNOSIS — D72819 Decreased white blood cell count, unspecified: Secondary | ICD-10-CM | POA: Diagnosis not present

## 2024-03-31 DIAGNOSIS — Z780 Asymptomatic menopausal state: Secondary | ICD-10-CM | POA: Diagnosis not present

## 2024-03-31 DIAGNOSIS — E7800 Pure hypercholesterolemia, unspecified: Secondary | ICD-10-CM | POA: Diagnosis not present

## 2024-03-31 DIAGNOSIS — M8588 Other specified disorders of bone density and structure, other site: Secondary | ICD-10-CM | POA: Diagnosis not present

## 2024-03-31 DIAGNOSIS — K909 Intestinal malabsorption, unspecified: Secondary | ICD-10-CM | POA: Diagnosis not present

## 2024-04-01 NOTE — Progress Notes (Signed)

## 2024-04-02 NOTE — H&P (Cosign Needed)
   Chief Complaint: Left forefoot pain HPI: Patient is a 68 year old female that presents to the room today for definitive treatment for her left forefoot pain. She has failed nonoperative treatment including activity modification, toe taping, shoewear modifications and oral anti-inflammatories, and elects for surgical intervention.  Allergies:  Allergies  Allergen Reactions   Ambien  [Zolpidem ] Other (See Comments)    MEMORY LOSS    Past Medical History:  Diagnosis Date   Anxiety    Diverticulosis    GERD (gastroesophageal reflux disease)    Heart murmur    History of obesity    Hypercholesteremia    Hyperlipidemia    Insomnia    Mitral valve regurgitation    mild   PONV (postoperative nausea and vomiting)    Prediabetes    PVC (premature ventricular contraction)    Serrated adenoma of colon 2015   Sinusitis     Past Surgical History:  Procedure Laterality Date   BUNIONECTOMY     Right foot    DILATION AND CURETTAGE OF UTERUS      Family History: Family History  Problem Relation Age of Onset   Diabetes Mother    Esophageal cancer Father    Stomach cancer Father    Heart disease Sister    Colon cancer Neg Hx    Rectal cancer Neg Hx     Social History:   reports that she has quit smoking. She has never used smokeless tobacco. She reports current alcohol use. She reports that she does not use drugs.  Medications: No medications prior to admission.    No results found for this or any previous visit (from the past 48 hours).  No results found.    Height 5' 10 (1.778 m), weight 88.3 kg.  PE:  well nourished and well developed.  NAD.  EOMI.  Resp unlabored.  Patient has L 2nd hammertoe deformity.  Tender to palpation at L 2nd MTP joint.  Assessment/Plan Left foot metatarsalgia and 2nd hammertoe  Patient presents to the OR today for surgical treatment for her left forefoot pain.  She will require left 2nd MT weil osteotomy; 2nd hammertoe correction, and  possible dorsal capsulotomy.  After reviewing the procedure, postop protocol, and risks of surgery the patient elects for surgical intervention.  The patient specifically understands risks of bleeding, infection, nerve damage, blood clots, need for additional surgery, continued pain, nonunion, post traumatic arthritis, recurrence of deformity, amputation and death.   Eva Barrack PA-C [facility] Office:  [phone]   Agree with findings documented above.  To the OR today for surgical treatment.  The risks and benefits of the alternative treatment options have been discussed in detail.  The patient wishes to proceed with surgery and specifically understands risks of bleeding, infection, nerve damage, blood clots, need for additional surgery, amputation and death.

## 2024-04-03 ENCOUNTER — Ambulatory Visit (HOSPITAL_BASED_OUTPATIENT_CLINIC_OR_DEPARTMENT_OTHER): Admitting: Anesthesiology

## 2024-04-03 ENCOUNTER — Other Ambulatory Visit: Payer: Self-pay

## 2024-04-03 ENCOUNTER — Ambulatory Visit (HOSPITAL_BASED_OUTPATIENT_CLINIC_OR_DEPARTMENT_OTHER)

## 2024-04-03 ENCOUNTER — Encounter (HOSPITAL_BASED_OUTPATIENT_CLINIC_OR_DEPARTMENT_OTHER): Payer: Self-pay | Admitting: Orthopedic Surgery

## 2024-04-03 ENCOUNTER — Other Ambulatory Visit (HOSPITAL_COMMUNITY): Payer: Self-pay

## 2024-04-03 ENCOUNTER — Encounter (HOSPITAL_BASED_OUTPATIENT_CLINIC_OR_DEPARTMENT_OTHER)
Admission: RE | Disposition: A | Discharge status: Home / Self Care | Payer: Self-pay | Source: Home / Self Care | Attending: Orthopedic Surgery

## 2024-04-03 ENCOUNTER — Ambulatory Visit (HOSPITAL_BASED_OUTPATIENT_CLINIC_OR_DEPARTMENT_OTHER)
Admission: RE | Admit: 2024-04-03 | Discharge: 2024-04-03 | Disposition: A | Discharge status: Home / Self Care | Attending: Orthopedic Surgery | Admitting: Orthopedic Surgery

## 2024-04-03 DIAGNOSIS — M2042 Other hammer toe(s) (acquired), left foot: Secondary | ICD-10-CM | POA: Insufficient documentation

## 2024-04-03 DIAGNOSIS — K219 Gastro-esophageal reflux disease without esophagitis: Secondary | ICD-10-CM | POA: Insufficient documentation

## 2024-04-03 DIAGNOSIS — M7742 Metatarsalgia, left foot: Secondary | ICD-10-CM | POA: Insufficient documentation

## 2024-04-03 DIAGNOSIS — Z87891 Personal history of nicotine dependence: Secondary | ICD-10-CM | POA: Insufficient documentation

## 2024-04-03 DIAGNOSIS — F32A Depression, unspecified: Secondary | ICD-10-CM | POA: Insufficient documentation

## 2024-04-03 DIAGNOSIS — F419 Anxiety disorder, unspecified: Secondary | ICD-10-CM | POA: Insufficient documentation

## 2024-04-03 HISTORY — PX: WEIL OSTEOTOMY: SHX5044

## 2024-04-03 HISTORY — DX: Other specified postprocedural states: Z98.890

## 2024-04-03 HISTORY — PX: HAMMER TOE SURGERY: SHX385

## 2024-04-03 SURGERY — OSTEOTOMY, WEIL
Anesthesia: General | Site: Toe | Laterality: Left

## 2024-04-03 MED ORDER — LIDOCAINE 2% (20 MG/ML) 5 ML SYRINGE
INTRAMUSCULAR | Status: AC
  Filled 2024-04-03: qty 5

## 2024-04-03 MED ORDER — SODIUM CHLORIDE 0.9 % IV SOLN
INTRAVENOUS | Status: AC | PRN
  Administered 2024-04-03: 200 mL

## 2024-04-03 MED ORDER — FENTANYL CITRATE (PF) 100 MCG/2ML IJ SOLN
INTRAMUSCULAR | Status: DC | PRN
  Administered 2024-04-03: 50 ug via INTRAVENOUS

## 2024-04-03 MED ORDER — LACTATED RINGERS IV SOLN: INTRAVENOUS | Status: DC

## 2024-04-03 MED ORDER — ONDANSETRON HCL 4 MG/2ML IJ SOLN
INTRAMUSCULAR | Status: AC
  Filled 2024-04-03: qty 2

## 2024-04-03 MED ORDER — SENNA 8.6 MG PO TABS
2.0000 | ORAL_TABLET | Freq: Two times a day (BID) | ORAL | 0 refills | Status: AC
  Filled 2024-04-03: qty 30, 8d supply, fill #0

## 2024-04-03 MED ORDER — PROPOFOL 10 MG/ML IV BOLUS
INTRAVENOUS | Status: DC | PRN
  Administered 2024-04-03: 170 mg via INTRAVENOUS

## 2024-04-03 MED ORDER — MIDAZOLAM HCL 2 MG/2ML IJ SOLN
INTRAMUSCULAR | Status: AC
  Filled 2024-04-03: qty 2

## 2024-04-03 MED ORDER — LIDOCAINE HCL (CARDIAC) PF 100 MG/5ML IV SOSY
PREFILLED_SYRINGE | INTRAVENOUS | Status: DC | PRN
  Administered 2024-04-03: 80 mg via INTRAVENOUS

## 2024-04-03 MED ORDER — PROPOFOL 500 MG/50ML IV EMUL
INTRAVENOUS | Status: DC | PRN
  Administered 2024-04-03: 200 ug/kg/min via INTRAVENOUS

## 2024-04-03 MED ORDER — VANCOMYCIN HCL 500 MG IV SOLR
INTRAVENOUS | Status: AC
  Filled 2024-04-03: qty 20

## 2024-04-03 MED ORDER — MIDAZOLAM HCL 5 MG/5ML IJ SOLN
INTRAMUSCULAR | Status: DC | PRN
  Administered 2024-04-03: 2 mg via INTRAVENOUS

## 2024-04-03 MED ORDER — ONDANSETRON HCL 4 MG/2ML IJ SOLN: 4.0000 mg | Freq: Once | INTRAMUSCULAR | Status: DC | PRN

## 2024-04-03 MED ORDER — ACETAMINOPHEN 500 MG PO TABS
1000.0000 mg | ORAL_TABLET | Freq: Once | ORAL | Status: AC
  Administered 2024-04-03: 1000 mg via ORAL

## 2024-04-03 MED ORDER — BUPIVACAINE-EPINEPHRINE (PF) 0.5% -1:200000 IJ SOLN
INTRAMUSCULAR | Status: AC
  Filled 2024-04-03: qty 30

## 2024-04-03 MED ORDER — CEFAZOLIN SODIUM-DEXTROSE 2-4 GM/100ML-% IV SOLN
2.0000 g | INTRAVENOUS | Status: AC
  Administered 2024-04-03: 2 g via INTRAVENOUS

## 2024-04-03 MED ORDER — DOCUSATE SODIUM 100 MG PO CAPS
100.0000 mg | ORAL_CAPSULE | Freq: Two times a day (BID) | ORAL | 0 refills | Status: AC
  Filled 2024-04-03: qty 30, 15d supply, fill #0

## 2024-04-03 MED ORDER — PROPOFOL 500 MG/50ML IV EMUL
INTRAVENOUS | Status: AC
  Filled 2024-04-03: qty 50

## 2024-04-03 MED ORDER — FENTANYL CITRATE (PF) 100 MCG/2ML IJ SOLN: 25.0000 ug | INTRAMUSCULAR | Status: DC | PRN

## 2024-04-03 MED ORDER — BUPIVACAINE-EPINEPHRINE 0.5% -1:200000 IJ SOLN
INTRAMUSCULAR | Status: DC | PRN
  Administered 2024-04-03: 10 mL

## 2024-04-03 MED ORDER — VANCOMYCIN HCL 500 MG IV SOLR
INTRAVENOUS | Status: AC
  Filled 2024-04-03: qty 10

## 2024-04-03 MED ORDER — DEXAMETHASONE SODIUM PHOSPHATE 10 MG/ML IJ SOLN
INTRAMUSCULAR | Status: AC
  Filled 2024-04-03: qty 1

## 2024-04-03 MED ORDER — 0.9 % SODIUM CHLORIDE (POUR BTL) OPTIME
TOPICAL | Status: DC | PRN
  Administered 2024-04-03: 120 mL

## 2024-04-03 MED ORDER — CEFAZOLIN SODIUM-DEXTROSE 2-4 GM/100ML-% IV SOLN
INTRAVENOUS | Status: AC
Start: 2024-04-03 — End: 2024-04-03
  Filled 2024-04-03: qty 100

## 2024-04-03 MED ORDER — OXYCODONE HCL 5 MG PO TABS
5.0000 mg | ORAL_TABLET | Freq: Four times a day (QID) | ORAL | 0 refills | Status: AC | PRN
  Filled 2024-04-03: qty 12, 3d supply, fill #0

## 2024-04-03 MED ORDER — PHENYLEPHRINE HCL (PRESSORS) 10 MG/ML IV SOLN
INTRAVENOUS | Status: DC | PRN
  Administered 2024-04-03: 80 ug via INTRAVENOUS

## 2024-04-03 MED ORDER — FENTANYL CITRATE (PF) 100 MCG/2ML IJ SOLN
INTRAMUSCULAR | Status: AC
  Filled 2024-04-03: qty 2

## 2024-04-03 MED ORDER — ONDANSETRON HCL 4 MG/2ML IJ SOLN
INTRAMUSCULAR | Status: DC | PRN
  Administered 2024-04-03: 4 mg via INTRAVENOUS

## 2024-04-03 MED ORDER — ACETAMINOPHEN 500 MG PO TABS
ORAL_TABLET | ORAL | Status: AC
  Filled 2024-04-03: qty 2

## 2024-04-03 MED ORDER — EPHEDRINE SULFATE (PRESSORS) 50 MG/ML IJ SOLN
INTRAMUSCULAR | Status: DC | PRN
  Administered 2024-04-03: 10 mg via INTRAVENOUS
  Administered 2024-04-03: 5 mg via INTRAVENOUS

## 2024-04-03 MED ORDER — DEXAMETHASONE SODIUM PHOSPHATE 10 MG/ML IJ SOLN
INTRAMUSCULAR | Status: DC | PRN
  Administered 2024-04-03: 10 mg via INTRAVENOUS

## 2024-04-03 SURGICAL SUPPLY — 63 items
BIT DRILL 2 STRT CANN (BIT) ×1 IMPLANT
BLADE AVERAGE 25X9 (BLADE) IMPLANT
BLADE LONG MED 25X9 (BLADE) ×2 IMPLANT
BLADE MINI RND TIP GREEN BEAV (BLADE) ×3 IMPLANT
BLADE OSC/SAG .038X5.5 CUT EDG (BLADE) IMPLANT
BLADE SURG 15 STRL LF DISP TIS (BLADE) ×6 IMPLANT
BNDG COHESIVE 4X5 TAN STRL LF (GAUZE/BANDAGES/DRESSINGS) IMPLANT
BNDG COMPR ESMARK 4X3 LF (GAUZE/BANDAGES/DRESSINGS) IMPLANT
BNDG COMPR ESMARK 6X3 LF (GAUZE/BANDAGES/DRESSINGS) ×3 IMPLANT
BNDG ELASTIC 4INX 5YD STR LF (GAUZE/BANDAGES/DRESSINGS) ×3 IMPLANT
BNDG STRETCH GAUZE 3IN X12FT (GAUZE/BANDAGES/DRESSINGS) ×3 IMPLANT
BUR MIS STRT 2.0X13 (BURR) ×1 IMPLANT
CHLORAPREP W/TINT 26 (MISCELLANEOUS) ×3 IMPLANT
COVER BACK TABLE 60X90IN (DRAPES) ×3 IMPLANT
CUFF TRNQT CYL 24X4X16.5-23 (TOURNIQUET CUFF) IMPLANT
CUFF TRNQT CYL 34X4.125X (TOURNIQUET CUFF) IMPLANT
DRAPE EXTREMITY T 121X128X90 (DISPOSABLE) ×3 IMPLANT
DRAPE INCISE IOBAN 66X45 STRL (DRAPES) IMPLANT
DRAPE OEC MINIVIEW 54X84 (DRAPES) ×3 IMPLANT
DRAPE U-SHAPE 47X51 STRL (DRAPES) ×3 IMPLANT
DRSG MEPITEL 4X7.2 (GAUZE/BANDAGES/DRESSINGS) ×3 IMPLANT
ELECTRODE REM PT RTRN 9FT ADLT (ELECTROSURGICAL) ×3 IMPLANT
GAUZE PAD ABD 8X10 STRL (GAUZE/BANDAGES/DRESSINGS) IMPLANT
GAUZE SPONGE 4X4 12PLY STRL (GAUZE/BANDAGES/DRESSINGS) ×3 IMPLANT
GAUZE STRETCH 2X75IN STRL (MISCELLANEOUS) IMPLANT
GLOVE BIO SURGEON STRL SZ8 (GLOVE) ×3 IMPLANT
GLOVE BIOGEL PI IND STRL 7.0 (GLOVE) ×1 IMPLANT
GLOVE BIOGEL PI IND STRL 7.5 (GLOVE) ×1 IMPLANT
GLOVE BIOGEL PI IND STRL 8 (GLOVE) ×6 IMPLANT
GLOVE ECLIPSE 8.0 STRL XLNG CF (GLOVE) ×3 IMPLANT
GLOVE SURG SS PI 7.0 STRL IVOR (GLOVE) ×1 IMPLANT
GOWN STRL REUS W/ TWL LRG LVL3 (GOWN DISPOSABLE) ×3 IMPLANT
GOWN STRL REUS W/ TWL XL LVL3 (GOWN DISPOSABLE) ×6 IMPLANT
GUIDEWIRE 0.86MM (WIRE) ×1 IMPLANT
KWIRE DBL .054X4 NSTRL (WIRE) IMPLANT
NDL HYPO 22X1.5 SAFETY MO (MISCELLANEOUS) IMPLANT
NDL HYPO 25X1 1.5 SAFETY (NEEDLE) IMPLANT
NEEDLE HYPO 22X1.5 SAFETY MO (MISCELLANEOUS) IMPLANT
NEEDLE HYPO 25X1 1.5 SAFETY (NEEDLE) ×3 IMPLANT
NS IRRIG 1000ML POUR BTL (IV SOLUTION) ×3 IMPLANT
PACK BASIN DAY SURGERY FS (CUSTOM PROCEDURE TRAY) ×3 IMPLANT
PAD CAST 4YDX4 CTTN HI CHSV (CAST SUPPLIES) ×3 IMPLANT
PADDING CAST ABS COTTON 4X4 ST (CAST SUPPLIES) IMPLANT
PENCIL SMOKE EVACUATOR (MISCELLANEOUS) ×3 IMPLANT
SANITIZER HAND ALTRA PUMP 550 (MISCELLANEOUS) ×3 IMPLANT
SCREW CANN COMP FT 2.5X20 (Screw) ×1 IMPLANT
SET IRRIGATION TUBING (TUBING) ×1 IMPLANT
SHEET MEDIUM DRAPE 40X70 STRL (DRAPES) ×3 IMPLANT
SLEEVE SCD COMPRESS KNEE MED (STOCKING) ×3 IMPLANT
SPONGE T-LAP 18X18 ~~LOC~~+RFID (SPONGE) ×3 IMPLANT
STOCKINETTE 6 STRL (DRAPES) ×3 IMPLANT
STRIP CLOSURE SKIN 1/2X4 (GAUZE/BANDAGES/DRESSINGS) ×1 IMPLANT
SUCTION TUBE FRAZIER 10FR DISP (SUCTIONS) IMPLANT
SUT ETHILON 3 0 PS 1 (SUTURE) ×3 IMPLANT
SUT MNCRL AB 3-0 PS2 18 (SUTURE) ×3 IMPLANT
SUT VIC AB 2-0 SH 27XBRD (SUTURE) IMPLANT
SUT VICRYL 0 UR6 27IN ABS (SUTURE) IMPLANT
SYR BULB EAR ULCER 3OZ GRN STR (SYRINGE) ×3 IMPLANT
SYR CONTROL 10ML LL (SYRINGE) ×1 IMPLANT
TOWEL GREEN STERILE FF (TOWEL DISPOSABLE) ×3 IMPLANT
TUBE CONNECTING 20X1/4 (TUBING) IMPLANT
UNDERPAD 30X36 HEAVY ABSORB (UNDERPADS AND DIAPERS) ×3 IMPLANT
YANKAUER SUCT BULB TIP NO VENT (SUCTIONS) IMPLANT

## 2024-04-03 NOTE — Op Note (Signed)
 04/03/2024  8:21 AM  PATIENT:  Megan Greer  68 y.o. female  PRE-OPERATIVE DIAGNOSIS: 1.  Left second hammertoe deformity 2.  Left forefoot metatarsalgia  POST-OPERATIVE DIAGNOSIS: Same  Procedure(s): 1.  Left second metatarsal minimally invasive Weil osteotomy  2.  Left second hammertoe correction 3.  Left foot AP, oblique and lateral radiographs  SURGEON:  Norleen Armor, MD  ASSISTANT: Eva Barrack, PA-C  ANESTHESIA:   General, regional  EBL:  minimal   TOURNIQUET: None  COMPLICATIONS:  None apparent  DISPOSITION:  Extubated, awake and stable to recovery.  INDICATION FOR PROCEDURE: 68 year old female without significant past medical history complains of pain at her left second hammertoe deformity.  She notices progression of the deformity as well.  She has failed nonoperative treatment including activity modification, toe spacer shoewear condition.  She presents today for treatment.  The risks and benefits of the alternative treatment options have been discussed in detail.  The patient wishes to proceed with surgery and specifically understands risks of bleeding, infection, nerve damage, blood clots, need for additional surgery, amputation and death.   PROCEDURE IN DETAIL:  After pre operative consent was obtained, and the correct operative site was identified, the patient was brought to the operating room and placed supine on the OR table.  Anesthesia was administered.  Pre-operative antibiotics were administered.  A surgical timeout was taken.  The left lower extremity was prepped and draped in standard sterile fashion.  A metatarsal block was performed with 10 cc of Marcaine  with epinephrine .  An AP radiograph was obtained.  The second metatarsal shaft was identified.  A small incision was made.  A curved elevator was passed dorsally and plantarly to the metatarsal shaft elevating the periosteum and creating a working space.  A 2 x 13 mm Shannon bur was inserted.  An oblique  osteotomy was made in the distal part of the metatarsal, proximal plantar to dorsal distal.  Mobility of the osteotomy was confirmed with fluoroscopy.  Attention was turned to the second toe.  The PIP joint was identified under fluoroscopy.  Small incision was made lateral to the joint.  A bur was inserted into the joint.  The remaining articular cartilage and subchondral bone was removed from both sides of the joint.  The joint was reduced.  A guidepin for the 2.5 mm Arthrex headless compression screws was inserted from the tip of the toe across the IP joints.  Radiographs confirmed appropriate reduction of the PIP joint and appropriate position of the guidepin.  Guidepin was overdrilled.  A 2.5 mm x 20 mm screw was inserted across the PIP joint.  AP, oblique and lateral radiographs confirmed appropriate reduction of the hammertoe deformity and appropriate position and length of the screw.  The wounds were irrigated copiously and closed with nylon.  Sterile dressings were applied with bolster between the hallux and second toe.  The patient was awakened from anesthesia and transported to the recovery room in stable condition.   FOLLOW UP PLAN: Weightbearing as tolerated in a flat postop shoe.  Follow-up in the office in 2 weeks for suture removal.  She also may have taping of the toes with plantarflexion.  No indication for DVT prophylaxis in the setting of the patient.   RADIOGRAPHS: AP, oblique and lateral radiographs of the left foot are obtained intraoperatively.  These show interval shortening of the second metatarsal, arthrodesis of the PIP joint of the second toe.  Alignment is appropriate.  No other acute injuries.  Justin Ollis PA-C was present and scrubbed for the duration of the operative case. His assistance was essential in positioning the patient, prepping and draping, gaining and maintaining exposure, performing the operation, closing and dressing the wounds and applying the splint.

## 2024-04-03 NOTE — Discharge Instructions (Addendum)
 Norleen Armor, MD [facility]  Please read the following information regarding your care after surgery.  Medications  You only need a prescription for the narcotic pain medicine (ex. oxycodone , Percocet, Norco).  All of the other medicines listed below are available over the counter. ? Aleve 2 pills twice a day for the first 3 days after surgery. ? acetominophen (Tylenol ) 650 mg every 4-6 hours as you need for minor to moderate pain ? oxycodone  as prescribed for severe pain  Narcotic pain medicine (ex. oxycodone , Percocet, Vicodin) will cause constipation.  To prevent this problem, take the following medicines while you are taking any pain medicine. ? docusate sodium  (Colace) 100 mg twice a day ? senna (Senokot) 2 tablets twice a day  Weight Bearing ? Bear weight only on your operated foot in the post-op shoe.   Cast / Splint / Dressing ? Keep your splint, cast or dressing clean and dry.  Don't put anything (coat hanger, pencil, etc) down inside of it.  If it gets damp, use a hair dryer on the cool setting to dry it.  If it gets soaked, call the office to schedule an appointment for a cast change.  After your dressing, cast or splint is removed; you may shower, but do not soak or scrub the wound.  Allow the water to run over it, and then gently pat it dry.  Swelling It is normal for you to have swelling where you had surgery.  To reduce swelling and pain, keep your toes above your nose for at least 3 days after surgery.  It may be necessary to keep your foot or leg elevated for several weeks.  If it hurts, it should be elevated.  Follow Up Call my office at [phone] when you are discharged from the hospital or surgery center to schedule an appointment to be seen two weeks after surgery.  Call my office at [phone] if you develop a fever >101.5 F, nausea, vomiting, bleeding from the surgical site or severe pain.      Post Anesthesia Home Care Instructions  Activity: Get  plenty of rest for the remainder of the day. A responsible individual must stay with you for 24 hours following the procedure.  For the next 24 hours, DO NOT: -Drive a car -Advertising copywriter -Drink alcoholic beverages -Take any medication unless instructed by your physician -Make any legal decisions or sign important papers.  Meals: Start with liquid foods such as gelatin or soup. Progress to regular foods as tolerated. Avoid greasy, spicy, heavy foods. If nausea and/or vomiting occur, drink only clear liquids until the nausea and/or vomiting subsides. Call your physician if vomiting continues.  Special Instructions/Symptoms: Your throat may feel dry or sore from the anesthesia or the breathing tube placed in your throat during surgery. If this causes discomfort, gargle with warm salt water. The discomfort should disappear within 24 hours.     Tylenol  can be taken after 12:30 pm if needed

## 2024-04-03 NOTE — Anesthesia Preprocedure Evaluation (Addendum)
 Anesthesia Evaluation  Patient identified by MRN, date of birth, ID band Patient awake    Reviewed: Allergy & Precautions, NPO status , Patient's Chart, lab work & pertinent test results  History of Anesthesia Complications (+) PONV and history of anesthetic complications  Airway Mallampati: II  TM Distance: >3 FB Neck ROM: Full    Dental  (+) Teeth Intact, Dental Advisory Given   Pulmonary former smoker   Pulmonary exam normal breath sounds clear to auscultation       Cardiovascular Exercise Tolerance: Good (-) hypertension(-) angina (-) Past MI Normal cardiovascular exam+ Valvular Problems/Murmurs MR  Rhythm:Regular Rate:Normal     Neuro/Psych  PSYCHIATRIC DISORDERS Anxiety Depression    negative neurological ROS     GI/Hepatic Neg liver ROS,GERD  Medicated,,  Endo/Other    Renal/GU negative Renal ROS     Musculoskeletal  Hammertoe of left foot   Abdominal   Peds  Hematology negative hematology ROS (+)   Anesthesia Other Findings Day of surgery medications reviewed with the patient.  Reproductive/Obstetrics                              Anesthesia Physical Anesthesia Plan  ASA: 2  Anesthesia Plan: General   Post-op Pain Management: Tylenol  PO (pre-op)* and Toradol  IV (intra-op)*   Induction: Intravenous  PONV Risk Score and Plan: 4 or greater and Midazolam , Dexamethasone , Ondansetron  and Propofol  infusion  Airway Management Planned: LMA  Additional Equipment:   Intra-op Plan:   Post-operative Plan: Extubation in OR  Informed Consent: I have reviewed the patients History and Physical, chart, labs and discussed the procedure including the risks, benefits and alternatives for the proposed anesthesia with the patient or authorized representative who has indicated his/her understanding and acceptance.     Dental advisory given  Plan Discussed with: CRNA  Anesthesia Plan  Comments:          Anesthesia Quick Evaluation

## 2024-04-03 NOTE — Transfer of Care (Signed)
 Immediate Anesthesia Transfer of Care Note  Patient: Megan Greer  Procedure(s) Performed: Left 2nd metatarsal weil osteotomy (Left: Foot) 2nd hammertoe correction (Left: Toe) possible dorsal capsulotomy (Left: Foot)  Patient Location: PACU  Anesthesia Type:General  Level of Consciousness: sedated  Airway & Oxygen Therapy: Patient Spontanous Breathing and Patient connected to face mask oxygen  Post-op Assessment: Report given to RN and Post -op Vital signs reviewed and stable  Post vital signs: Reviewed and stable  Last Vitals:  Vitals Value Taken Time  BP 113/53 04/03/24 08:16  Temp 36.3 C 04/03/24 08:16  Pulse 66 04/03/24 08:19  Resp 14 04/03/24 08:19  SpO2 97 % 04/03/24 08:19  Vitals shown include unfiled device data.  Last Pain:  Vitals:   04/03/24 0629  TempSrc: Temporal      Patients Stated Pain Goal: 3 (04/03/24 0629)  Complications: No notable events documented.

## 2024-04-03 NOTE — Anesthesia Procedure Notes (Signed)
 Procedure Name: LMA Insertion Date/Time: 04/03/2024 7:38 AM  Performed by: Julieanne Fairy BROCKS, CRNAPre-anesthesia Checklist: Patient identified, Emergency Drugs available, Suction available and Patient being monitored Patient Re-evaluated:Patient Re-evaluated prior to induction Oxygen Delivery Method: Circle system utilized Preoxygenation: Pre-oxygenation with 100% oxygen Induction Type: IV induction Ventilation: Mask ventilation without difficulty LMA: LMA inserted LMA Size: 4.0 Number of attempts: 1 Airway Equipment and Method: Bite block Placement Confirmation: positive ETCO2 Tube secured with: Tape Dental Injury: Teeth and Oropharynx as per pre-operative assessment

## 2024-04-04 ENCOUNTER — Encounter (HOSPITAL_BASED_OUTPATIENT_CLINIC_OR_DEPARTMENT_OTHER): Payer: Self-pay | Admitting: Orthopedic Surgery

## 2024-04-04 ENCOUNTER — Other Ambulatory Visit: Payer: Self-pay | Admitting: Internal Medicine

## 2024-04-04 DIAGNOSIS — Z1231 Encounter for screening mammogram for malignant neoplasm of breast: Secondary | ICD-10-CM

## 2024-04-04 DIAGNOSIS — N644 Mastodynia: Secondary | ICD-10-CM

## 2024-04-04 NOTE — Anesthesia Postprocedure Evaluation (Signed)
 Anesthesia Post Note  Patient: Megan Greer  Procedure(s) Performed: Left Second metatarsal weil osteotomy (Left: Foot) Second hammertoe correction (Left: Toe)     Patient location during evaluation: PACU Anesthesia Type: General Level of consciousness: awake and alert Pain management: pain level controlled Vital Signs Assessment: post-procedure vital signs reviewed and stable Respiratory status: spontaneous breathing, nonlabored ventilation and respiratory function stable Cardiovascular status: blood pressure returned to baseline and stable Postop Assessment: no apparent nausea or vomiting Anesthetic complications: no   No notable events documented.  Last Vitals:  Vitals:   04/03/24 0845 04/03/24 0857  BP: 104/64 126/75  Pulse: 63 63  Resp: 14 16  Temp:  (!) 36.2 C  SpO2: 93% 94%    Last Pain:  Vitals:   04/03/24 0857  TempSrc: Temporal  PainSc: 0-No pain                 Garnette FORBES Skillern

## 2024-04-18 ENCOUNTER — Other Ambulatory Visit (HOSPITAL_COMMUNITY): Payer: Self-pay

## 2024-04-18 ENCOUNTER — Other Ambulatory Visit: Payer: Self-pay

## 2024-04-25 ENCOUNTER — Other Ambulatory Visit: Payer: Self-pay

## 2024-04-25 ENCOUNTER — Other Ambulatory Visit (HOSPITAL_COMMUNITY): Payer: Self-pay

## 2024-04-28 ENCOUNTER — Other Ambulatory Visit: Payer: Self-pay

## 2024-04-28 ENCOUNTER — Other Ambulatory Visit (HOSPITAL_COMMUNITY): Payer: Self-pay

## 2024-04-28 MED ORDER — ROSUVASTATIN CALCIUM 10 MG PO TABS
10.0000 mg | ORAL_TABLET | Freq: Every morning | ORAL | 0 refills | Status: DC
  Filled 2024-04-28: qty 90, 90d supply, fill #0

## 2024-04-30 ENCOUNTER — Other Ambulatory Visit (HOSPITAL_COMMUNITY): Payer: Self-pay

## 2024-04-30 ENCOUNTER — Other Ambulatory Visit: Payer: Self-pay

## 2024-05-01 ENCOUNTER — Other Ambulatory Visit (HOSPITAL_COMMUNITY): Payer: Self-pay

## 2024-05-19 DIAGNOSIS — M7742 Metatarsalgia, left foot: Secondary | ICD-10-CM | POA: Diagnosis not present

## 2024-07-01 ENCOUNTER — Ambulatory Visit
Admission: RE | Admit: 2024-07-01 | Discharge: 2024-07-01 | Disposition: A | Source: Ambulatory Visit | Attending: Internal Medicine | Admitting: Internal Medicine

## 2024-07-01 ENCOUNTER — Ambulatory Visit

## 2024-07-01 DIAGNOSIS — N644 Mastodynia: Secondary | ICD-10-CM

## 2024-07-06 ENCOUNTER — Other Ambulatory Visit (HOSPITAL_COMMUNITY): Payer: Self-pay

## 2024-07-07 ENCOUNTER — Other Ambulatory Visit: Payer: Self-pay

## 2024-07-07 ENCOUNTER — Other Ambulatory Visit (HOSPITAL_COMMUNITY): Payer: Self-pay

## 2024-08-11 ENCOUNTER — Other Ambulatory Visit (HOSPITAL_COMMUNITY): Payer: Self-pay

## 2024-08-11 ENCOUNTER — Other Ambulatory Visit: Payer: Self-pay

## 2024-08-11 MED ORDER — ROSUVASTATIN CALCIUM 10 MG PO TABS
10.0000 mg | ORAL_TABLET | Freq: Every morning | ORAL | 0 refills | Status: AC
  Filled 2024-08-11 (×2): qty 90, 90d supply, fill #0

## 2024-08-12 ENCOUNTER — Other Ambulatory Visit (HOSPITAL_COMMUNITY): Payer: Self-pay

## 2024-08-13 ENCOUNTER — Other Ambulatory Visit (HOSPITAL_COMMUNITY): Payer: Self-pay

## 2024-08-22 ENCOUNTER — Other Ambulatory Visit (HOSPITAL_COMMUNITY): Payer: Self-pay

## 2024-08-22 MED ORDER — HYDROCODONE-ACETAMINOPHEN 5-325 MG PO TABS
1.0000 | ORAL_TABLET | Freq: Four times a day (QID) | ORAL | 0 refills | Status: AC
  Filled 2024-08-22: qty 20, 5d supply, fill #0

## 2024-08-28 ENCOUNTER — Ambulatory Visit: Attending: Orthopedic Surgery | Admitting: Physical Therapy

## 2024-08-28 ENCOUNTER — Encounter: Payer: Self-pay | Admitting: Physical Therapy

## 2024-08-28 ENCOUNTER — Other Ambulatory Visit: Payer: Self-pay

## 2024-08-28 DIAGNOSIS — M25561 Pain in right knee: Secondary | ICD-10-CM | POA: Diagnosis present

## 2024-08-28 DIAGNOSIS — M6281 Muscle weakness (generalized): Secondary | ICD-10-CM | POA: Diagnosis not present

## 2024-08-28 DIAGNOSIS — R262 Difficulty in walking, not elsewhere classified: Secondary | ICD-10-CM | POA: Diagnosis not present

## 2024-08-28 NOTE — Therapy (Signed)
 " OUTPATIENT PHYSICAL THERAPY LOWER EXTREMITY EVALUATION   Patient Name: Megan Greer MRN: [id number] DOB:21-Jul-1956, 69 y.o., female Today's Date: 08/28/2024  END OF SESSION:  PT End of Session - 08/28/24 1233     Visit Number 1    Date for Recertification  10/23/24    Authorization Type [employer] Medicare    PT Start Time 1235    PT Stop Time 1315    PT Time Calculation (min) 40 min    Activity Tolerance Patient tolerated treatment well          Past Medical History:  Diagnosis Date   Anxiety    Diverticulosis    GERD (gastroesophageal reflux disease)    Heart murmur    History of obesity    Hypercholesteremia    Hyperlipidemia    Insomnia    Mitral valve regurgitation    mild   PONV (postoperative nausea and vomiting)    Prediabetes    PVC (premature ventricular contraction)    Serrated adenoma of colon 2015   Sinusitis    Past Surgical History:  Procedure Laterality Date   BUNIONECTOMY     Right foot    DILATION AND CURETTAGE OF UTERUS     HAMMER TOE SURGERY Left 04/03/2024   Procedure: Second hammertoe correction;  Surgeon: Kit Rush, MD;  Location: [facility];  Service: Orthopedics;  Laterality: Left;   WEIL OSTEOTOMY Left 04/03/2024   Procedure: Left Second metatarsal weil osteotomy;  Surgeon: Kit Rush, MD;  Location: [facility];  Service: Orthopedics;  Laterality: Left;   Patient Active Problem List   Diagnosis Date Noted   Abdominal pain, chronic, right lower quadrant 08/13/2013   Hypothyroidism 05/06/2013   Transaminitis 01/18/2012   Anxiety and depression 11/17/2011   Preventative health care 11/17/2011   Osteopenia 11/17/2011   Chronic insomnia 11/17/2011   Hyperlipidemia 11/17/2011   Gastroenteritis 11/17/2011    PCP: Charlott Carrier MD  REFERRING PROVIDER: Beverley Lye MD  REFERRING DIAG: S83.91XD sprain of right knee  THERAPY DIAG:  Right knee pain; right knee stiffness; edema; difficulty in  walking  Rationale for Evaluation and Treatment: Rehabilitation  ONSET DATE: 1  week   SUBJECTIVE:   SUBJECTIVE STATEMENT: 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  Initially couldn't put weight on it.  Saw Dr. Beverley. X-ray OK. Used crutches for a short time. Wore a knee immobilizer through the weekend.  Got MRI knee sprain.  Removed immobilizer Monday. Feels like it might buckle but a fleeting sensation.  Using ice.    PERTINENT HISTORY: 2nd toe surgery on left side Left ACL deficient PAIN:   Are you having pain? yes NPRS scale: 5/10 Pain location: right lateral and posterior/lateral knee pain Pain orientation: Right  PAIN TYPE: throbbing Pain description: constant  Aggravating factors: stiff after sitting, turning, lifting leg in/out of the car; limps toward the end of the day Relieving factors: ice   PRECAUTIONS: None    WEIGHT BEARING RESTRICTIONS: No  FALLS:  Has patient fallen in last 6 months? No  LIVING ENVIRONMENT: Lives with: lives with their spouse Lives in: House/apartment Stairs: has stairs but bedroom downstairs   OCCUPATION: nurse  PLOF: Independent  PATIENT GOALS: be able to work (I'm completely out of PTO time)  works 7-8 hours shifts; unable to work since this happened    OBJECTIVE:  Note: Objective measures were completed at  Evaluation unless otherwise noted.  DIAGNOSTIC FINDINGS: per MD note: MRI of the right knee shows no fracture but reveals a significant effusion. Independent interpretation performed by me confirms no surgical intervention is required.   PATIENT SURVEYS:  LEFS  Extreme difficulty/unable (0), Quite a bit of difficulty (1), Moderate difficulty (2), Little difficulty (3), No difficulty (4) Survey date:  08/28/24  Any of your usual work, housework or school activities 2  2. Usual hobbies, recreational or sporting activities 0  3. Getting into/out of  the bath 0  4. Walking between rooms 3  5. Putting on socks/shoes 2  6. Squatting  0  7. Lifting an object, like a bag of groceries from the floor 2  8. Performing light activities around your home 3  9. Performing heavy activities around your home 1  10. Getting into/out of a car 2  11. Walking 2 blocks 1  12. Walking 1 mile 0  13. Going up/down 10 stairs (1 flight) 1  14. Standing for 1 hour 2  15.  sitting for 1 hour 4  16. Running on even ground 0  17. Running on uneven ground 0  18. Making sharp turns while running fast 0  19. Hopping  0  20. Rolling over in bed 2  Score total:  25/80     COGNITION: Overall cognitive status: Within functional limits for tasks assessed      EDEMA:  Mild peripatellar edema  PALPATION: Tenderness lateral knee epicondyle  LOWER EXTREMITY ROM:  Active ROM Right eval Left eval  Hip flexion 100 110  Hip extension    Hip abduction    Hip adduction    Hip internal rotation    Hip external rotation    Knee flexion 137 142  Knee extension 3 0  Ankle dorsiflexion    Ankle plantarflexion    Ankle inversion    Ankle eversion     (Blank rows = not tested)  LOWER EXTREMITY MMT: Right quad lag with SLR  MMT Right eval Left eval  Hip flexion 4+ 5  Hip extension    Hip abduction 4 4+  Hip adduction    Hip internal rotation    Hip external rotation    Knee flexion 4- 5  Knee extension 4- 5  Ankle dorsiflexion    Ankle plantarflexion    Ankle inversion    Ankle eversion     (Blank rows = not tested)   FUNCTIONAL TESTS:  Requires moderate UE assist to rise sit to stand TUG 10.19 with UE assistance   GAIT:  Comments: no assistive device; decreased stance time on right                                                                                                                                TREATMENT DATE: 08/28/2024 evaluation  Initial HEP   PATIENT EDUCATION:  Education details: Educated patient on anatomy and  physiology of  current symptoms, prognosis, plan of care as well as initial self care strategies to promote recovery Person educated: Patient Education method: Explanation Education comprehension: verbalized understanding  HOME EXERCISE PROGRAM: Access Code: [email or web address] URL: [email or web address] Date: 08/28/2024 Prepared by: Glade Pesa  Exercises - Supine Heel Slide  - 1 x daily - 7 x weekly - 1 sets - 10 reps - Supine Quad Set  - 1 x daily - 7 x weekly - 1 sets - 10 reps - Active Straight Leg Raise with Quad Set  - 1 x daily - 7 x weekly - 1 sets - 10 reps - Seated Heel Slide  - 1 x daily - 7 x weekly - 1 sets - 10 reps - Seated Long Arc Quad  - 1 x daily - 7 x weekly - 1 sets - 10 reps  ASSESSMENT:  CLINICAL IMPRESSION: Patient is a 69 y.o. female who was seen today for physical therapy evaluation and treatment for acute right knee sprain. The incident occurred 1 week ago when she was at work (nursing) and she was demonstrating to a patient how to move off the stretcher. She experienced knee give way and was initially unable to put weight on it. She used a knee immobilizer and crutches but is now able to ambulate shorter distances without an assistive device and discontinued the immobilizer on Monday.  The patient demonstrates decreased knee flexion and extension range of motion with peripatellar edema noted.  Strength deficits and asymmetry with opposite knee include decreased quad strength and hamstring strength.  +quad lag with SLR Decreased stance phase noted during gait.   These deficits and pain cause functional impairments with standing, walking, going up and down curbs and steps at home and in the community.   She has been unable to work since the injury.  OBJECTIVE IMPAIRMENTS: decreased activity tolerance, difficulty walking, decreased ROM, decreased strength, increased edema, impaired perceived functional ability, and pain.   ACTIVITY LIMITATIONS: lifting, sitting,  standing, squatting, stairs, bed mobility, and locomotion level  PARTICIPATION LIMITATIONS: meal prep, cleaning, laundry, shopping, community activity, and occupation  PERSONAL FACTORS: 1 comorbidity: left side musculoskeletal issues are also affecting patient's functional outcome.   REHAB POTENTIAL: Good  CLINICAL DECISION MAKING: Stable/uncomplicated  EVALUATION COMPLEXITY: Low   GOALS: Goals reviewed with patient? Yes  SHORT TERM GOALS: Target date: 09/25/2024   The patient will demonstrate knowledge of basic self care strategies and exercises to promote healing  Baseline: Goal status: INITIAL  2.  The patient will report a 30% improvement in pain levels with functional activities which are currently difficult including rising after sitting, turning, lifting leg in/out of the car Baseline:  Goal status: INITIAL  3.  Improved knee ROM 0-140 needed for greater ease with sit to stand and getting in/out of the car Baseline:  Goal status: INITIAL     LONG TERM GOALS: Target date: 10/23/2024    The patient will be independent in a safe self progression of a home exercise program to promote further recovery of function  Baseline:  Goal status: INITIAL  2.  The patient will report a 60% improvement in pain levels with functional activities which are currently difficult including rising after sitting, turning, lifting leg in/out of the car Baseline:  Goal status: INITIAL  3.  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  The patient will have improved LE strength of at least 4+/5  needed to ascend and descend steps reciprocally  Baseline:  Goal status: INITIAL  5.  LEFS functional outcome scale improved to   40/80   indicating improved function with less pain Baseline:  Goal status: INITIAL     PLAN:  PT FREQUENCY: 1-2x/week PT DURATION: 8 weeks  PLANNED INTERVENTIONS: 97164- PT  Re-evaluation, 97750- Physical Performance Testing, 97110-Therapeutic exercises, 97530- Therapeutic activity, 97112- Neuromuscular re-education, 97535- Self Care, 02859- Manual therapy, [phone]- Gait training, [phone]- Aquatic Therapy, [phone]- Electrical stimulation (unattended), [phone]- Electrical stimulation (manual), [id number]- Vasopneumatic device, [id number]- Ultrasound, [id number]- Ionotophoresis 4mg /ml Dexamethasone , 79439 (1-2 muscles), 20561 (3+ muscles)- Dry Needling, Patient/Family education, Balance training, Stair training, Taping, Joint mobilization, Cryotherapy, and Moist heat  PLAN FOR NEXT SESSION: knee ROM; oscillations on the bike or Nu-Step; quad activation ex's; vasocompression  Glade Pesa, PT 08/28/24 5:39 PM Phone: [phone] Fax: [phone]     "

## 2024-09-02 ENCOUNTER — Ambulatory Visit: Admitting: Physical Therapy

## 2024-09-02 DIAGNOSIS — R262 Difficulty in walking, not elsewhere classified: Secondary | ICD-10-CM

## 2024-09-02 DIAGNOSIS — M25561 Pain in right knee: Secondary | ICD-10-CM

## 2024-09-02 DIAGNOSIS — M6281 Muscle weakness (generalized): Secondary | ICD-10-CM

## 2024-09-02 NOTE — Therapy (Signed)
 " OUTPATIENT PHYSICAL THERAPY LOWER EXTREMITY PROGRESS NOTE   Patient Name: Megan Greer MRN: [id number] DOB:08-30-1955, 69 y.o., female Today's Date: 09/02/2024  END OF SESSION:  PT End of Session - 09/02/24 0748     Visit Number 2    Date for Recertification  10/23/24    Authorization Type [employer] Medicare 12 visits 1/15-3/12    Authorization - Visit Number 2    Authorization - Number of Visits 12    Progress Note Due on Visit 10    PT Start Time 0750    PT Stop Time 0830    PT Time Calculation (min) 40 min    Activity Tolerance Patient tolerated treatment well          Past Medical History:  Diagnosis Date   Anxiety    Diverticulosis    GERD (gastroesophageal reflux disease)    Heart murmur    History of obesity    Hypercholesteremia    Hyperlipidemia    Insomnia    Mitral valve regurgitation    mild   PONV (postoperative nausea and vomiting)    Prediabetes    PVC (premature ventricular contraction)    Serrated adenoma of colon 2015   Sinusitis    Past Surgical History:  Procedure Laterality Date   BUNIONECTOMY     Right foot    DILATION AND CURETTAGE OF UTERUS     HAMMER TOE SURGERY Left 04/03/2024   Procedure: Second hammertoe correction;  Surgeon: Kit Rush, MD;  Location: [facility];  Service: Orthopedics;  Laterality: Left;   WEIL OSTEOTOMY Left 04/03/2024   Procedure: Left Second metatarsal weil osteotomy;  Surgeon: Kit Rush, MD;  Location: [facility];  Service: Orthopedics;  Laterality: Left;   Patient Active Problem List   Diagnosis Date Noted   Abdominal pain, chronic, right lower quadrant 08/13/2013   Hypothyroidism 05/06/2013   Transaminitis 01/18/2012   Anxiety and depression 11/17/2011   Preventative health care 11/17/2011   Osteopenia 11/17/2011   Chronic insomnia 11/17/2011   Hyperlipidemia 11/17/2011   Gastroenteritis 11/17/2011    PCP: Charlott Carrier MD  REFERRING PROVIDER: Beverley Lye  MD  REFERRING DIAG: S83.91XD sprain of right knee  THERAPY DIAG:  Right knee pain; right knee stiffness; edema; difficulty in walking  Rationale for Evaluation and Treatment: Rehabilitation  ONSET DATE: 1  week   SUBJECTIVE:   SUBJECTIVE STATEMENT: Tight and stiffness;  worked 8 hours yesterday and did OK.  My other knee feels tight on the back of the knee.  I did the exercises, they helped right away.  Works 4 hours tomorrow.    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  Initially couldn't put weight on it.  Saw Dr. Beverley. X-ray OK. Used crutches for a short time. Wore a knee immobilizer through the weekend.  Got MRI knee sprain.  Removed immobilizer Monday. Feels like it might buckle but a fleeting sensation.  Using ice.    PERTINENT HISTORY: 2nd toe surgery on left side Left ACL deficient PAIN:   Are you having pain? yes NPRS scale: 3-4/10 Pain location: right lateral and posterior/lateral knee pain Pain orientation: Right  PAIN TYPE: throbbing Pain description: constant  Aggravating factors: stiff after sitting, turning, lifting leg in/out of the car; limps toward the end of the day Relieving factors: ice   PRECAUTIONS: None    WEIGHT BEARING RESTRICTIONS: No  FALLS:  Has patient fallen in last 6 months? No  LIVING ENVIRONMENT: Lives with: lives with their spouse Lives in: House/apartment Stairs: has stairs but bedroom downstairs   OCCUPATION: nurse  PLOF: Independent  PATIENT GOALS: be able to work (I'm completely out of PTO time)  works 7-8 hours shifts; unable to work since this happened    OBJECTIVE:  Note: Objective measures were completed at Evaluation unless otherwise noted.  DIAGNOSTIC FINDINGS: per MD note: MRI of the right knee shows no fracture but reveals a significant effusion. Independent interpretation performed by me confirms no surgical intervention is  required.   PATIENT SURVEYS:  LEFS  Extreme difficulty/unable (0), Quite a bit of difficulty (1), Moderate difficulty (2), Little difficulty (3), No difficulty (4) Survey date:  08/28/24  Any of your usual work, housework or school activities 2  2. Usual hobbies, recreational or sporting activities 0  3. Getting into/out of the bath 0  4. Walking between rooms 3  5. Putting on socks/shoes 2  6. Squatting  0  7. Lifting an object, like a bag of groceries from the floor 2  8. Performing light activities around your home 3  9. Performing heavy activities around your home 1  10. Getting into/out of a car 2  11. Walking 2 blocks 1  12. Walking 1 mile 0  13. Going up/down 10 stairs (1 flight) 1  14. Standing for 1 hour 2  15.  sitting for 1 hour 4  16. Running on even ground 0  17. Running on uneven ground 0  18. Making sharp turns while running fast 0  19. Hopping  0  20. Rolling over in bed 2  Score total:  25/80     COGNITION: Overall cognitive status: Within functional limits for tasks assessed      EDEMA:  Mild peripatellar edema  PALPATION: Tenderness lateral knee epicondyle  LOWER EXTREMITY ROM:  Active ROM Right eval Left eval  Hip flexion 100 110  Hip extension    Hip abduction    Hip adduction    Hip internal rotation    Hip external rotation    Knee flexion 137 142  Knee extension 3 0  Ankle dorsiflexion    Ankle plantarflexion    Ankle inversion    Ankle eversion     (Blank rows = not tested)  LOWER EXTREMITY MMT: Right quad lag with SLR  MMT Right eval Left eval  Hip flexion 4+ 5  Hip extension    Hip abduction 4 4+  Hip adduction    Hip internal rotation    Hip external rotation    Knee flexion 4- 5  Knee extension 4- 5  Ankle dorsiflexion    Ankle plantarflexion    Ankle inversion    Ankle eversion     (Blank rows = not tested)   FUNCTIONAL TESTS:  Requires moderate UE assist to rise sit to stand TUG 10.19 with UE assistance    GAIT:  Comments: no assistive device; decreased stance time on right  TREATMENT DATE:  09/02/24: SLR 10x Sidelying hip abduction 10x Clams 10x Supine green ball rolls for knee flexion 10x HS sets on green ball 10x Bridge with Les over green ball 10x 6 inch step ups 5x WB on right with left 3 ways 10x Bike 4 min full revolutions L1 TKEs blue band 2x10 Standing hip extension blue band 10x right/left   08/28/2024 evaluation  Initial HEP   PATIENT EDUCATION:  Education details: Educated patient on anatomy and physiology of current symptoms, prognosis, plan of care as well as initial self care strategies to promote recovery Person educated: Patient Education method: Explanation Education comprehension: verbalized understanding  HOME EXERCISE PROGRAM: Access Code: [email or web address] URL: [email or web address] Date: 08/28/2024 Prepared by: Glade Pesa  Exercises - Supine Heel Slide  - 1 x daily - 7 x weekly - 1 sets - 10 reps - Supine Quad Set  - 1 x daily - 7 x weekly - 1 sets - 10 reps - Active Straight Leg Raise with Quad Set  - 1 x daily - 7 x weekly - 1 sets - 10 reps - Seated Heel Slide  - 1 x daily - 7 x weekly - 1 sets - 10 reps - Seated Long Arc Quad  - 1 x daily - 7 x weekly - 1 sets - 10 reps  ASSESSMENT:  CLINICAL IMPRESSION: Arielle is responding well to  ex's for muscle pumping to decrease swelling and activate knee musculature.  Her pain level and ability to contract quads is much improved from initial visit. Therapist monitoring response to all interventions and providing verbal cues to optimize technique with ex's.     OBJECTIVE IMPAIRMENTS: decreased activity tolerance, difficulty walking, decreased ROM, decreased strength, increased edema, impaired perceived functional ability, and pain.   ACTIVITY LIMITATIONS: lifting,  sitting, standing, squatting, stairs, bed mobility, and locomotion level  PARTICIPATION LIMITATIONS: meal prep, cleaning, laundry, shopping, community activity, and occupation  PERSONAL FACTORS: 1 comorbidity: left side musculoskeletal issues are also affecting patient's functional outcome.   REHAB POTENTIAL: Good  CLINICAL DECISION MAKING: Stable/uncomplicated  EVALUATION COMPLEXITY: Low   GOALS: Goals reviewed with patient? Yes  SHORT TERM GOALS: Target date: 09/25/2024   The patient will demonstrate knowledge of basic self care strategies and exercises to promote healing  Baseline: Goal status: INITIAL  2.  The patient will report a 30% improvement in pain levels with functional activities which are currently difficult including rising after sitting, turning, lifting leg in/out of the car Baseline:  Goal status: INITIAL  3.  Improved knee ROM 0-140 needed for greater ease with sit to stand and getting in/out of the car Baseline:  Goal status: INITIAL     LONG TERM GOALS: Target date: 10/23/2024    The patient will be independent in a safe self progression of a home exercise program to promote further recovery of function  Baseline:  Goal status: INITIAL  2.  The patient will report a 60% improvement in pain levels with functional activities which are currently difficult including rising after sitting, turning, lifting leg in/out of the car Baseline:  Goal status: INITIAL  3.  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  The patient will have improved LE strength of at least 4+/5 needed to ascend and descend steps reciprocally  Baseline:  Goal status: INITIAL  5.  LEFS functional outcome scale improved to   40/80   indicating improved  function with less pain Baseline:  Goal status: INITIAL     PLAN:  PT FREQUENCY: 1-2x/week PT DURATION: 8 weeks  PLANNED INTERVENTIONS: 97164- PT  Re-evaluation, 97750- Physical Performance Testing, 97110-Therapeutic exercises, 97530- Therapeutic activity, 97112- Neuromuscular re-education, 97535- Self Care, 02859- Manual therapy, [phone]- Gait training, [phone]- Aquatic Therapy, [phone]- Electrical stimulation (unattended), [phone]- Electrical stimulation (manual), [id number]- Vasopneumatic device, [id number]- Ultrasound, [id number]- Ionotophoresis 4mg /ml Dexamethasone , 79439 (1-2 muscles), 20561 (3+ muscles)- Dry Needling, Patient/Family education, Balance training, Stair training, Taping, Joint mobilization, Cryotherapy, and Moist heat  PLAN FOR NEXT SESSION: knee ROM; oscillations on the bike or Nu-Step; quad activation ex's; vasocompression Glade Pesa, PT 09/02/24 8:43 AM Phone: [phone] Fax: [phone]       "

## 2024-09-04 ENCOUNTER — Encounter: Payer: Self-pay | Admitting: Physical Therapy

## 2024-09-04 ENCOUNTER — Ambulatory Visit: Admitting: Physical Therapy

## 2024-09-04 DIAGNOSIS — M25561 Pain in right knee: Secondary | ICD-10-CM | POA: Diagnosis not present

## 2024-09-04 DIAGNOSIS — R262 Difficulty in walking, not elsewhere classified: Secondary | ICD-10-CM

## 2024-09-04 DIAGNOSIS — M6281 Muscle weakness (generalized): Secondary | ICD-10-CM

## 2024-09-04 NOTE — Therapy (Signed)
 " OUTPATIENT PHYSICAL THERAPY LOWER EXTREMITY PROGRESS NOTE   Patient Name: Megan Greer MRN: [id number] DOB:Feb 12, 1956, 69 y.o., female Today's Date: 09/04/2024  END OF SESSION:  PT End of Session - 09/04/24 0849     Visit Number 3    Date for Recertification  10/23/24    Authorization Type [employer] Medicare 12 visits 1/15-3/12    Authorization - Visit Number 3    Authorization - Number of Visits 12    Progress Note Due on Visit 10    PT Start Time 0846    PT Stop Time 0928    PT Time Calculation (min) 42 min    Activity Tolerance Patient tolerated treatment well          Past Medical History:  Diagnosis Date   Anxiety    Diverticulosis    GERD (gastroesophageal reflux disease)    Heart murmur    History of obesity    Hypercholesteremia    Hyperlipidemia    Insomnia    Mitral valve regurgitation    mild   PONV (postoperative nausea and vomiting)    Prediabetes    PVC (premature ventricular contraction)    Serrated adenoma of colon 2015   Sinusitis    Past Surgical History:  Procedure Laterality Date   BUNIONECTOMY     Right foot    DILATION AND CURETTAGE OF UTERUS     HAMMER TOE SURGERY Left 04/03/2024   Procedure: Second hammertoe correction;  Surgeon: Kit Rush, MD;  Location: [facility];  Service: Orthopedics;  Laterality: Left;   WEIL OSTEOTOMY Left 04/03/2024   Procedure: Left Second metatarsal weil osteotomy;  Surgeon: Kit Rush, MD;  Location: [facility];  Service: Orthopedics;  Laterality: Left;   Patient Active Problem List   Diagnosis Date Noted   Abdominal pain, chronic, right lower quadrant 08/13/2013   Hypothyroidism 05/06/2013   Transaminitis 01/18/2012   Anxiety and depression 11/17/2011   Preventative health care 11/17/2011   Osteopenia 11/17/2011   Chronic insomnia 11/17/2011   Hyperlipidemia 11/17/2011   Gastroenteritis 11/17/2011    PCP: Charlott Carrier MD  REFERRING PROVIDER: Beverley Lye  MD  REFERRING DIAG: S83.91XD sprain of right knee  THERAPY DIAG:  Right knee pain; right knee stiffness; edema; difficulty in walking  Rationale for Evaluation and Treatment: Rehabilitation  ONSET DATE: 1  week   SUBJECTIVE:   SUBJECTIVE STATEMENT: Had increase in pain yesterday to 5/10 in left knee more than right.  Better today.  Got called off work yesterday.  Working advertising account executive.     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  Initially couldn't put weight on it.  Saw Dr. Beverley. X-ray OK. Used crutches for a short time. Wore a knee immobilizer through the weekend.  Got MRI knee sprain.  Removed immobilizer Monday. Feels like it might buckle but a fleeting sensation.  Using ice.    PERTINENT HISTORY: 2nd toe surgery on left side Left ACL deficient Had left knee PT at [facility] in 2022  PAIN:   Are you having pain? yes NPRS scale: 2/10 Pain location: right lateral and posterior/lateral knee pain; left knee pain Pain orientation: Right  PAIN TYPE: throbbing Pain description: constant  Aggravating factors: stiff after sitting, turning, lifting leg in/out of the car; limps toward the end of the day Relieving factors: ice   PRECAUTIONS: None    WEIGHT BEARING RESTRICTIONS:  No  FALLS:  Has patient fallen in last 6 months? No  LIVING ENVIRONMENT: Lives with: lives with their spouse Lives in: House/apartment Stairs: has stairs but bedroom downstairs   OCCUPATION: nurse  PLOF: Independent  PATIENT GOALS: be able to work (I'm completely out of PTO time)  works 7-8 hours shifts; unable to work since this happened    OBJECTIVE:  Note: Objective measures were completed at Evaluation unless otherwise noted.  DIAGNOSTIC FINDINGS: per MD note: MRI of the right knee shows no fracture but reveals a significant effusion. Independent interpretation performed by me confirms no surgical intervention  is required.   PATIENT SURVEYS:  LEFS  Extreme difficulty/unable (0), Quite a bit of difficulty (1), Moderate difficulty (2), Little difficulty (3), No difficulty (4) Survey date:  08/28/24  Any of your usual work, housework or school activities 2  2. Usual hobbies, recreational or sporting activities 0  3. Getting into/out of the bath 0  4. Walking between rooms 3  5. Putting on socks/shoes 2  6. Squatting  0  7. Lifting an object, like a bag of groceries from the floor 2  8. Performing light activities around your home 3  9. Performing heavy activities around your home 1  10. Getting into/out of a car 2  11. Walking 2 blocks 1  12. Walking 1 mile 0  13. Going up/down 10 stairs (1 flight) 1  14. Standing for 1 hour 2  15.  sitting for 1 hour 4  16. Running on even ground 0  17. Running on uneven ground 0  18. Making sharp turns while running fast 0  19. Hopping  0  20. Rolling over in bed 2  Score total:  25/80     COGNITION: Overall cognitive status: Within functional limits for tasks assessed      EDEMA:  Mild peripatellar edema  PALPATION: Tenderness lateral knee epicondyle  LOWER EXTREMITY ROM:  Active ROM Right eval Left eval  Hip flexion 100 110  Hip extension    Hip abduction    Hip adduction    Hip internal rotation    Hip external rotation    Knee flexion 137 142  Knee extension 3 0  Ankle dorsiflexion    Ankle plantarflexion    Ankle inversion    Ankle eversion     (Blank rows = not tested)  LOWER EXTREMITY MMT: Right quad lag with SLR  MMT Right eval Left eval  Hip flexion 4+ 5  Hip extension    Hip abduction 4 4+  Hip adduction    Hip internal rotation    Hip external rotation    Knee flexion 4- 5  Knee extension 4- 5  Ankle dorsiflexion    Ankle plantarflexion    Ankle inversion    Ankle eversion     (Blank rows = not tested)   FUNCTIONAL TESTS:  Requires moderate UE assist to rise sit to stand TUG 10.19 with UE assistance    GAIT:  Comments: no assistive device; decreased stance time on right  TREATMENT DATE:  09/04/24: SLR 10x right/left Supine clams with green band double and single 10x each Supine green ball rolls for flexion 10x HS sets on green ball 10x Bridge with Les over green ball 10x Single leg standing 4 ways right/left 10x Bike 4 min full revolutions L1 TKEs green band x10 right/left  Vasocompression with elevation 10 min moderate compression   09/02/24: SLR 10x Sidelying hip abduction 10x Clams 10x Supine green ball rolls for knee flexion 10x HS sets on green ball 10x Bridge with Les over green ball 10x 6 inch step ups 5x WB on right with left 3 ways 10x Bike 4 min full revolutions L1 TKEs blue band 2x10 Standing hip extension blue band 10x right/left   08/28/2024 evaluation  Initial HEP   PATIENT EDUCATION:  Education details: Educated patient on anatomy and physiology of current symptoms, prognosis, plan of care as well as initial self care strategies to promote recovery Person educated: Patient Education method: Explanation Education comprehension: verbalized understanding  HOME EXERCISE PROGRAM: Access Code: [email or web address] URL: [email or web address] Date: 08/28/2024 Prepared by: Glade Pesa  Exercises - Supine Heel Slide  - 1 x daily - 7 x weekly - 1 sets - 10 reps - Supine Quad Set  - 1 x daily - 7 x weekly - 1 sets - 10 reps - Active Straight Leg Raise with Quad Set  - 1 x daily - 7 x weekly - 1 sets - 10 reps - Seated Heel Slide  - 1 x daily - 7 x weekly - 1 sets - 10 reps - Seated Long Arc Quad  - 1 x daily - 7 x weekly - 1 sets - 10 reps  ASSESSMENT:  CLINICAL IMPRESSION: Patient called yesterday to the clinic to reports increased pain post-session.  Although her pain is much better today, treatment was modified to be a  lower intensity and included vasocompression for pain and edema control.  Therapist monitoring response to all interventions and modifying treatment accordingly.    OBJECTIVE IMPAIRMENTS: decreased activity tolerance, difficulty walking, decreased ROM, decreased strength, increased edema, impaired perceived functional ability, and pain.   ACTIVITY LIMITATIONS: lifting, sitting, standing, squatting, stairs, bed mobility, and locomotion level  PARTICIPATION LIMITATIONS: meal prep, cleaning, laundry, shopping, community activity, and occupation  PERSONAL FACTORS: 1 comorbidity: left side musculoskeletal issues are also affecting patient's functional outcome.   REHAB POTENTIAL: Good  CLINICAL DECISION MAKING: Stable/uncomplicated  EVALUATION COMPLEXITY: Low   GOALS: Goals reviewed with patient? Yes  SHORT TERM GOALS: Target date: 09/25/2024   The patient will demonstrate knowledge of basic self care strategies and exercises to promote healing  Baseline: Goal status: INITIAL  2.  The patient will report a 30% improvement in pain levels with functional activities which are currently difficult including rising after sitting, turning, lifting leg in/out of the car Baseline:  Goal status: INITIAL  3.  Improved knee ROM 0-140 needed for greater ease with sit to stand and getting in/out of the car Baseline:  Goal status: INITIAL     LONG TERM GOALS: Target date: 10/23/2024    The patient will be independent in a safe self progression of a home exercise program to promote further recovery of function  Baseline:  Goal status: INITIAL  2.  The patient will report a 60% improvement in pain levels with functional activities which are currently difficult including rising after sitting, turning, lifting leg in/out of the car Baseline:  Goal status: INITIAL  3.  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  The  patient will have improved LE strength of at least 4+/5 needed to ascend and descend steps reciprocally  Baseline:  Goal status: INITIAL  5.  LEFS functional outcome scale improved to   40/80   indicating improved function with less pain Baseline:  Goal status: INITIAL     PLAN:  PT FREQUENCY: 1-2x/week PT DURATION: 8 weeks  PLANNED INTERVENTIONS: 97164- PT Re-evaluation, 97750- Physical Performance Testing, 97110-Therapeutic exercises, 97530- Therapeutic activity, 97112- Neuromuscular re-education, 97535- Self Care, 02859- Manual therapy, [id number]- Gait training, [phone]- Aquatic Therapy, [id number]- Electrical stimulation (unattended), [phone]- Electrical stimulation (manual), [id number]- Vasopneumatic device, [id number]- Ultrasound, [id number]- Ionotophoresis 4mg /ml Dexamethasone , 79439 (1-2 muscles), 20561 (3+ muscles)- Dry Needling, Patient/Family education, Balance training, Stair training, Taping, Joint mobilization, Cryotherapy, and Moist heat  PLAN FOR NEXT SESSION: knee ROM;  bike or Nu-Step; quad activation ex's; vasocompression as needed  Glade Pesa, PT 09/04/24 5:45 PM Phone: [phone] Fax: [phone]        "

## 2024-09-09 ENCOUNTER — Ambulatory Visit: Admitting: Physical Therapy

## 2024-09-09 ENCOUNTER — Encounter: Payer: Self-pay | Admitting: Physical Therapy

## 2024-09-09 DIAGNOSIS — M25561 Pain in right knee: Secondary | ICD-10-CM

## 2024-09-09 DIAGNOSIS — M6281 Muscle weakness (generalized): Secondary | ICD-10-CM

## 2024-09-09 DIAGNOSIS — R262 Difficulty in walking, not elsewhere classified: Secondary | ICD-10-CM

## 2024-09-09 NOTE — Therapy (Signed)
 " OUTPATIENT PHYSICAL THERAPY LOWER EXTREMITY PROGRESS NOTE   Patient Name: Megan Greer MRN: [id number] DOB:10-02-1955, 69 y.o., female Today's Date: 09/09/2024  END OF SESSION:  PT End of Session - 09/09/24 0922     Visit Number 4    Date for Recertification  10/23/24    Authorization Type [employer] Medicare 12 visits 1/15-3/12    Authorization - Visit Number 4    Authorization - Number of Visits 12    PT Start Time 0922    PT Stop Time 1010    PT Time Calculation (min) 48 min    Activity Tolerance Patient tolerated treatment well          Past Medical History:  Diagnosis Date   Anxiety    Diverticulosis    GERD (gastroesophageal reflux disease)    Heart murmur    History of obesity    Hypercholesteremia    Hyperlipidemia    Insomnia    Mitral valve regurgitation    mild   PONV (postoperative nausea and vomiting)    Prediabetes    PVC (premature ventricular contraction)    Serrated adenoma of colon 2015   Sinusitis    Past Surgical History:  Procedure Laterality Date   BUNIONECTOMY     Right foot    DILATION AND CURETTAGE OF UTERUS     HAMMER TOE SURGERY Left 04/03/2024   Procedure: Second hammertoe correction;  Surgeon: Kit Rush, MD;  Location: [facility];  Service: Orthopedics;  Laterality: Left;   WEIL OSTEOTOMY Left 04/03/2024   Procedure: Left Second metatarsal weil osteotomy;  Surgeon: Kit Rush, MD;  Location: [facility];  Service: Orthopedics;  Laterality: Left;   Patient Active Problem List   Diagnosis Date Noted   Abdominal pain, chronic, right lower quadrant 08/13/2013   Hypothyroidism 05/06/2013   Transaminitis 01/18/2012   Anxiety and depression 11/17/2011   Preventative health care 11/17/2011   Osteopenia 11/17/2011   Chronic insomnia 11/17/2011   Hyperlipidemia 11/17/2011   Gastroenteritis 11/17/2011    PCP: Charlott Carrier MD  REFERRING PROVIDER: Beverley Lye MD  REFERRING DIAG: S83.91XD  sprain of right knee  THERAPY DIAG:  Right knee pain; right knee stiffness; edema; difficulty in walking  Rationale for Evaluation and Treatment: Rehabilitation  ONSET DATE: 1  week   SUBJECTIVE:   SUBJECTIVE STATEMENT: Ice helping.  With activity, getting in the car I would feel it.  Better in the mornings.   A little uptick in soreness after PT.  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  Initially couldn't put weight on it.  Saw Dr. Beverley. X-ray OK. Used crutches for a short time. Wore a knee immobilizer through the weekend.  Got MRI knee sprain.  Removed immobilizer Monday. Feels like it might buckle but a fleeting sensation.  Using ice.    PERTINENT HISTORY: 2nd toe surgery on left side Left ACL deficient Had left knee PT at [facility] in 2022  PAIN:   Are you having pain? yes NPRS scale: 3/10 Pain location: right lateral and posterior/lateral knee pain; left knee pain Pain orientation: Right  PAIN TYPE: throbbing Pain description: constant  Aggravating factors: stiff after sitting, turning, lifting leg in/out of the car; limps toward the end of the day Relieving factors: ice   PRECAUTIONS: None    WEIGHT  BEARING RESTRICTIONS: No  FALLS:  Has patient fallen in last 6 months? No  LIVING ENVIRONMENT: Lives with: lives with their spouse Lives in: House/apartment Stairs: has stairs but bedroom downstairs   OCCUPATION: nurse  PLOF: Independent  PATIENT GOALS: be able to work (I'm completely out of PTO time)  works 7-8 hours shifts; unable to work since this happened    OBJECTIVE:  Note: Objective measures were completed at Evaluation unless otherwise noted.  DIAGNOSTIC FINDINGS: per MD note: MRI of the right knee shows no fracture but reveals a significant effusion. Independent interpretation performed by me confirms no surgical  intervention is required.   PATIENT SURVEYS:  LEFS  Extreme difficulty/unable (0), Quite a bit of difficulty (1), Moderate difficulty (2), Little difficulty (3), No difficulty (4) Survey date:  08/28/24  Any of your usual work, housework or school activities 2  2. Usual hobbies, recreational or sporting activities 0  3. Getting into/out of the bath 0  4. Walking between rooms 3  5. Putting on socks/shoes 2  6. Squatting  0  7. Lifting an object, like a bag of groceries from the floor 2  8. Performing light activities around your home 3  9. Performing heavy activities around your home 1  10. Getting into/out of a car 2  11. Walking 2 blocks 1  12. Walking 1 mile 0  13. Going up/down 10 stairs (1 flight) 1  14. Standing for 1 hour 2  15.  sitting for 1 hour 4  16. Running on even ground 0  17. Running on uneven ground 0  18. Making sharp turns while running fast 0  19. Hopping  0  20. Rolling over in bed 2  Score total:  25/80     COGNITION: Overall cognitive status: Within functional limits for tasks assessed      EDEMA:  Mild peripatellar edema  PALPATION: Tenderness lateral knee epicondyle  LOWER EXTREMITY ROM:  Active ROM Right eval Left eval  Hip flexion 100 110  Hip extension    Hip abduction    Hip adduction    Hip internal rotation    Hip external rotation    Knee flexion 137 142  Knee extension 3 0  Ankle dorsiflexion    Ankle plantarflexion    Ankle inversion    Ankle eversion     (Blank rows = not tested)  LOWER EXTREMITY MMT: Right quad lag with SLR  MMT Right eval Left eval  Hip flexion 4+ 5  Hip extension    Hip abduction 4 4+  Hip adduction    Hip internal rotation    Hip external rotation    Knee flexion 4- 5  Knee extension 4- 5  Ankle dorsiflexion    Ankle plantarflexion    Ankle inversion    Ankle eversion     (Blank rows = not tested)   FUNCTIONAL TESTS:  Requires moderate UE assist to rise sit to stand TUG 10.19 with UE  assistance   GAIT:  Comments: no assistive device; decreased stance time on right  TREATMENT DATE:  09/09/24: SLR 10x right/left, opp knee bent for comfort Supine clams with green band double and single 10x each cues to maintain space b/w knees on return Supine green ball rolls for flexion 15x HS sets on green ball 5 sec hold 10x submax effort <50% Bridge with Les over green ball 10x Seated quad set 5 sec holds 10x submax effort < 50% effort Bil heel raises 10x HS stretch on 2nd step 20 sec hold 3x right/left Single leg standing tip forward to right/left 10x TKEs green band x15 right/left  Bike 5 min full revolutions L1  cues for speed Vasocompression with elevation 10 min moderate compression left  09/04/24: SLR 10x right/left Supine clams with green band double and single 10x each Supine green ball rolls for flexion 10x HS sets on green ball 10x Bridge with Les over green ball 10x Single leg standing 4 ways right/left 10x Bike 4 min full revolutions L1 TKEs green band x10 right/left  Vasocompression with elevation 10 min moderate compression   09/02/24: SLR 10x Sidelying hip abduction 10x Clams 10x Supine green ball rolls for knee flexion 10x HS sets on green ball 10x Bridge with Les over green ball 10x 6 inch step ups 5x WB on right with left 3 ways 10x Bike 4 min full revolutions L1 TKEs blue band 2x10 Standing hip extension blue band 10x right/left    PATIENT EDUCATION:  Education details: Educated patient on anatomy and physiology of current symptoms, prognosis, plan of care as well as initial self care strategies to promote recovery Person educated: Patient Education method: Explanation Education comprehension: verbalized understanding  HOME EXERCISE PROGRAM: Access Code: [email or web address] URL: [email or web address] Date:  08/28/2024 Prepared by: Glade Pesa  Exercises - Supine Heel Slide  - 1 x daily - 7 x weekly - 1 sets - 10 reps - Supine Quad Set  - 1 x daily - 7 x weekly - 1 sets - 10 reps - Active Straight Leg Raise with Quad Set  - 1 x daily - 7 x weekly - 1 sets - 10 reps - Seated Heel Slide  - 1 x daily - 7 x weekly - 1 sets - 10 reps - Seated Long Arc Quad  - 1 x daily - 7 x weekly - 1 sets - 10 reps  ASSESSMENT:  CLINICAL IMPRESSION: Therapist providing verbal cues to optimize technique with exercises in order to achieve the greatest benefit. Also closely monitoring response and cues on isometric intensity to avoid symptom exacerbation from delayed onset muscle soreness.  Her injury was to right knee however her left knee is more swollen today. Recommend a graded exposure to exercise secondary to patient susceptible to post-ex soreness.       OBJECTIVE IMPAIRMENTS: decreased activity tolerance, difficulty walking, decreased ROM, decreased strength, increased edema, impaired perceived functional ability, and pain.   ACTIVITY LIMITATIONS: lifting, sitting, standing, squatting, stairs, bed mobility, and locomotion level  PARTICIPATION LIMITATIONS: meal prep, cleaning, laundry, shopping, community activity, and occupation  PERSONAL FACTORS: 1 comorbidity: left side musculoskeletal issues are also affecting patient's functional outcome.   REHAB POTENTIAL: Good  CLINICAL DECISION MAKING: Stable/uncomplicated  EVALUATION COMPLEXITY: Low   GOALS: Goals reviewed with patient? Yes  SHORT TERM GOALS: Target date: 09/25/2024   The patient will demonstrate knowledge of basic self care strategies and exercises to promote healing  Baseline: Goal status: INITIAL  2.  The patient will report a 30% improvement in pain levels with functional activities which are currently difficult  including rising after sitting, turning, lifting leg in/out of the car Baseline:  Goal status: INITIAL  3.  Improved  knee ROM 0-140 needed for greater ease with sit to stand and getting in/out of the car Baseline:  Goal status: INITIAL     LONG TERM GOALS: Target date: 10/23/2024    The patient will be independent in a safe self progression of a home exercise program to promote further recovery of function  Baseline:  Goal status: INITIAL  2.  The patient will report a 60% improvement in pain levels with functional activities which are currently difficult including rising after sitting, turning, lifting leg in/out of the car Baseline:  Goal status: INITIAL  3.  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  The patient will have improved LE strength of at least 4+/5 needed to ascend and descend steps reciprocally  Baseline:  Goal status: INITIAL  5.  LEFS functional outcome scale improved to   40/80   indicating improved function with less pain Baseline:  Goal status: INITIAL     PLAN:  PT FREQUENCY: 1-2x/week PT DURATION: 8 weeks  PLANNED INTERVENTIONS: 97164- PT Re-evaluation, 97750- Physical Performance Testing, 97110-Therapeutic exercises, 97530- Therapeutic activity, 97112- Neuromuscular re-education, 97535- Self Care, 02859- Manual therapy, [id number]- Gait training, [phone]- Aquatic Therapy, [phone]- Electrical stimulation (unattended), [phone]- Electrical stimulation (manual), [id number]- Vasopneumatic device, [id number]- Ultrasound, [id number]- Ionotophoresis 4mg /ml Dexamethasone , 79439 (1-2 muscles), 20561 (3+ muscles)- Dry Needling, Patient/Family education, Balance training, Stair training, Taping, Joint mobilization, Cryotherapy, and Moist heat  PLAN FOR NEXT SESSION: graded exposure to exercise; knee ROM;  bike or Nu-Step; quad activation ex's; vasocompression as needed  Glade Pesa, PT 09/09/24 10:33 AM Phone: [phone] Fax: [phone]                                                                                                    "

## 2024-09-11 ENCOUNTER — Ambulatory Visit: Admitting: Physical Therapy

## 2024-09-11 ENCOUNTER — Encounter: Payer: Self-pay | Admitting: Physical Therapy

## 2024-09-11 DIAGNOSIS — M25561 Pain in right knee: Secondary | ICD-10-CM | POA: Diagnosis not present

## 2024-09-11 DIAGNOSIS — M6281 Muscle weakness (generalized): Secondary | ICD-10-CM

## 2024-09-11 DIAGNOSIS — R262 Difficulty in walking, not elsewhere classified: Secondary | ICD-10-CM

## 2024-09-11 NOTE — Therapy (Signed)
 " OUTPATIENT PHYSICAL THERAPY LOWER EXTREMITY PROGRESS NOTE   Patient Name: Megan Greer MRN: [id number] DOB:1955-12-24, 69 y.o., female Today's Date: 09/11/2024  END OF SESSION:  PT End of Session - 09/11/24 1035     Visit Number 5    Date for Recertification  10/23/24    Authorization Type [employer] 12 visits 1/15-3/12    Authorization - Visit Number 5    Authorization - Number of Visits 12    Progress Note Due on Visit 10    PT Start Time 0932    PT Stop Time 1026    PT Time Calculation (min) 54 min    Activity Tolerance Patient tolerated treatment well    Behavior During Therapy WFL for tasks assessed/performed           Past Medical History:  Diagnosis Date   Anxiety    Diverticulosis    GERD (gastroesophageal reflux disease)    Heart murmur    History of obesity    Hypercholesteremia    Hyperlipidemia    Insomnia    Mitral valve regurgitation    mild   PONV (postoperative nausea and vomiting)    Prediabetes    PVC (premature ventricular contraction)    Serrated adenoma of colon 2015   Sinusitis    Past Surgical History:  Procedure Laterality Date   BUNIONECTOMY     Right foot    DILATION AND CURETTAGE OF UTERUS     HAMMER TOE SURGERY Left 04/03/2024   Procedure: Second hammertoe correction;  Surgeon: Kit Rush, MD;  Location: [facility];  Service: Orthopedics;  Laterality: Left;   WEIL OSTEOTOMY Left 04/03/2024   Procedure: Left Second metatarsal weil osteotomy;  Surgeon: Kit Rush, MD;  Location: [facility];  Service: Orthopedics;  Laterality: Left;   Patient Active Problem List   Diagnosis Date Noted   Abdominal pain, chronic, right lower quadrant 08/13/2013   Hypothyroidism 05/06/2013   Transaminitis 01/18/2012   Anxiety and depression 11/17/2011   Preventative health care 11/17/2011   Osteopenia 11/17/2011   Chronic insomnia 11/17/2011   Hyperlipidemia 11/17/2011   Gastroenteritis 11/17/2011    PCP:  Charlott Carrier MD  REFERRING PROVIDER: Beverley Lye MD  REFERRING DIAG: S83.91XD sprain of right knee  THERAPY DIAG:  Right knee pain; right knee stiffness; edema; difficulty in walking  Rationale for Evaluation and Treatment: Rehabilitation  ONSET DATE: 1  week   SUBJECTIVE:   SUBJECTIVE STATEMENT: She worked 4 hours yesterday and that aggravated the knee a little bit. She iced it. She felt soreness in her left knee. 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  Initially couldn't put weight on it.  Saw Dr. Beverley. X-ray OK. Used crutches for a short time. Wore a knee immobilizer through the weekend.  Got MRI knee sprain.  Removed immobilizer Monday. Feels like it might buckle but a fleeting sensation.  Using ice.    PERTINENT HISTORY: 2nd toe surgery on left side Left ACL deficient Had left knee PT at [facility] in 2022  PAIN:   Are you having pain? yes NPRS scale: 3/10 Pain location: right lateral and posterior/lateral knee pain; left knee pain Pain orientation: Right  PAIN TYPE: throbbing Pain description: constant  Aggravating factors: stiff after sitting, turning, lifting leg in/out of the car; limps toward the end of the day  Relieving factors: ice   PRECAUTIONS: None    WEIGHT BEARING RESTRICTIONS: No  FALLS:  Has patient fallen in last 6 months? No  LIVING ENVIRONMENT: Lives with: lives with their spouse Lives in: House/apartment Stairs: has stairs but bedroom downstairs   OCCUPATION: nurse  PLOF: Independent  PATIENT GOALS: be able to work (I'm completely out of PTO time)  works 7-8 hours shifts; unable to work since this happened    OBJECTIVE:  Note: Objective measures were completed at Evaluation unless otherwise noted.  DIAGNOSTIC FINDINGS: per MD note: MRI of the right knee shows no fracture but reveals a significant effusion.  Independent interpretation performed by me confirms no surgical intervention is required.   PATIENT SURVEYS:  LEFS  Extreme difficulty/unable (0), Quite a bit of difficulty (1), Moderate difficulty (2), Little difficulty (3), No difficulty (4) Survey date:  08/28/24  Any of your usual work, housework or school activities 2  2. Usual hobbies, recreational or sporting activities 0  3. Getting into/out of the bath 0  4. Walking between rooms 3  5. Putting on socks/shoes 2  6. Squatting  0  7. Lifting an object, like a bag of groceries from the floor 2  8. Performing light activities around your home 3  9. Performing heavy activities around your home 1  10. Getting into/out of a car 2  11. Walking 2 blocks 1  12. Walking 1 mile 0  13. Going up/down 10 stairs (1 flight) 1  14. Standing for 1 hour 2  15.  sitting for 1 hour 4  16. Running on even ground 0  17. Running on uneven ground 0  18. Making sharp turns while running fast 0  19. Hopping  0  20. Rolling over in bed 2  Score total:  25/80     COGNITION: Overall cognitive status: Within functional limits for tasks assessed      EDEMA:  Mild peripatellar edema  PALPATION: Tenderness lateral knee epicondyle  LOWER EXTREMITY ROM:  Active ROM Right eval Left eval  Hip flexion 100 110  Hip extension    Hip abduction    Hip adduction    Hip internal rotation    Hip external rotation    Knee flexion 137 142  Knee extension 3 0  Ankle dorsiflexion    Ankle plantarflexion    Ankle inversion    Ankle eversion     (Blank rows = not tested)  LOWER EXTREMITY MMT: Right quad lag with SLR  MMT Right eval Left eval  Hip flexion 4+ 5  Hip extension    Hip abduction 4 4+  Hip adduction    Hip internal rotation    Hip external rotation    Knee flexion 4- 5  Knee extension 4- 5  Ankle dorsiflexion    Ankle plantarflexion    Ankle inversion    Ankle eversion     (Blank rows = not tested)   FUNCTIONAL TESTS:   Requires moderate UE assist to rise sit to stand TUG 10.19 with UE assistance   GAIT:  Comments: no assistive device; decreased stance time on right  TREATMENT DATE:  09/11/24: Bike 5 min full revolutions L1  cues for speed Supine quad activation with towel roll x 15 bilateral 5 sec hold SLR 15x right/left, opp knee bent for comfort HS sets on green ball 5 sec hold 15x  Bridge with Les over green ball 15x Hooklying TA activation + hip abduction with red loop x 15 Hooklying TA activation + hip flexion with red loop x 20 total SL clam with red loop x 15 bilateral  Sit to stand x 10 (fatigue and increased knee valgus at rep 7) Bil heel raises 10x Calf stretch with rockerboard 2 x 30 sec  Hamstring stretch at stair 2 x 30 sec bilateral  Vasocompression with elevation 10 min moderate compression left   09/09/24: SLR 10x right/left, opp knee bent for comfort Supine clams with green band double and single 10x each cues to maintain space b/w knees on return Supine green ball rolls for flexion 15x HS sets on green ball 5 sec hold 10x submax effort <50% Bridge with Les over green ball 10x Seated quad set 5 sec holds 10x submax effort < 50% effort Bil heel raises 10x HS stretch on 2nd step 20 sec hold 3x right/left Single leg standing tip forward to right/left 10x TKEs green band x15 right/left  Bike 5 min full revolutions L1  cues for speed Vasocompression with elevation 10 min moderate compression left  09/04/24: SLR 10x right/left Supine clams with green band double and single 10x each Supine green ball rolls for flexion 10x HS sets on green ball 10x Bridge with Les over green ball 10x Single leg standing 4 ways right/left 10x Bike 4 min full revolutions L1 TKEs green band x10 right/left  Vasocompression with elevation 10 min moderate  compression   09/02/24: SLR 10x Sidelying hip abduction 10x Clams 10x Supine green ball rolls for knee flexion 10x HS sets on green ball 10x Bridge with Les over green ball 10x 6 inch step ups 5x WB on right with left 3 ways 10x Bike 4 min full revolutions L1 TKEs blue band 2x10 Standing hip extension blue band 10x right/left    PATIENT EDUCATION:  Education details: Educated patient on anatomy and physiology of current symptoms, prognosis, plan of care as well as initial self care strategies to promote recovery Person educated: Patient Education method: Explanation Education comprehension: verbalized understanding  HOME EXERCISE PROGRAM: Access Code: [email or web address] URL: [email or web address] Date: 08/28/2024 Prepared by: Glade Pesa  Exercises - Supine Heel Slide  - 1 x daily - 7 x weekly - 1 sets - 10 reps - Supine Quad Set  - 1 x daily - 7 x weekly - 1 sets - 10 reps - Active Straight Leg Raise with Quad Set  - 1 x daily - 7 x weekly - 1 sets - 10 reps - Seated Heel Slide  - 1 x daily - 7 x weekly - 1 sets - 10 reps - Seated Long Arc Quad  - 1 x daily - 7 x weekly - 1 sets - 10 reps  ASSESSMENT:  CLINICAL IMPRESSION: Rilie presents with no current pain, but she had increased pain after her 4 hour shift yesterday. Today we continued the same exercises, but increased reps. She tolerated all progressions well and did not verbalize any increased pain or discomfort. Provided patient education and discussion on quality of life and initiating regular exercise program. Discussed motivators, challenges, and goals for future.  PT provided verbal cues as needed for optimal exercise performance. Patient  will benefit from skilled PT to address the below impairments and improve overall function.      OBJECTIVE IMPAIRMENTS: decreased activity tolerance, difficulty walking, decreased ROM, decreased strength, increased edema, impaired perceived functional ability, and pain.    ACTIVITY LIMITATIONS: lifting, sitting, standing, squatting, stairs, bed mobility, and locomotion level  PARTICIPATION LIMITATIONS: meal prep, cleaning, laundry, shopping, community activity, and occupation  PERSONAL FACTORS: 1 comorbidity: left side musculoskeletal issues are also affecting patient's functional outcome.   REHAB POTENTIAL: Good  CLINICAL DECISION MAKING: Stable/uncomplicated  EVALUATION COMPLEXITY: Low   GOALS: Goals reviewed with patient? Yes  SHORT TERM GOALS: Target date: 09/25/2024   The patient will demonstrate knowledge of basic self care strategies and exercises to promote healing  Baseline: Goal status: INITIAL  2.  The patient will report a 30% improvement in pain levels with functional activities which are currently difficult including rising after sitting, turning, lifting leg in/out of the car Baseline:  Goal status: INITIAL  3.  Improved knee ROM 0-140 needed for greater ease with sit to stand and getting in/out of the car Baseline:  Goal status: INITIAL    LONG TERM GOALS: Target date: 10/23/2024    The patient will be independent in a safe self progression of a home exercise program to promote further recovery of function  Baseline:  Goal status: INITIAL  2.  The patient will report a 60% improvement in pain levels with functional activities which are currently difficult including rising after sitting, turning, lifting leg in/out of the car Baseline:  Goal status: INITIAL  3.  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  The patient will have improved LE strength of at least 4+/5 needed to ascend and descend steps reciprocally  Baseline:  Goal status: INITIAL  5.  LEFS functional outcome scale improved to   40/80   indicating improved function with less pain Baseline:  Goal status: INITIAL     PLAN:  PT FREQUENCY: 1-2x/week PT DURATION: 8  weeks  PLANNED INTERVENTIONS: 97164- PT Re-evaluation, 97750- Physical Performance Testing, 97110-Therapeutic exercises, 97530- Therapeutic activity, [id number]- Neuromuscular re-education, 97535- Self Care, [id number]- Manual therapy, [id number]- Gait training, [phone]- Aquatic Therapy, [phone]- Electrical stimulation (unattended), [phone]- Electrical stimulation (manual), [id number]- Vasopneumatic device, [id number]- Ultrasound, [id number]- Ionotophoresis 4mg /ml Dexamethasone , 79439 (1-2 muscles), [id number] (3+ muscles)- Dry Needling, Patient/Family education, Balance training, Stair training, Taping, Joint mobilization, Cryotherapy, and Moist heat  PLAN FOR NEXT SESSION: assess response to treatment session; update HEP; knee ROM;  bike or Nu-Step; quad activation ex's; vasocompression as needed  Kristeen Sar, PT, DPT 09/11/24 10:36 AM                                                                                                   "

## 2024-09-17 ENCOUNTER — Ambulatory Visit: Admitting: Physical Therapy

## 2024-09-17 ENCOUNTER — Encounter: Payer: Self-pay | Admitting: Physical Therapy

## 2024-09-17 ENCOUNTER — Other Ambulatory Visit (HOSPITAL_COMMUNITY): Payer: Self-pay

## 2024-09-17 DIAGNOSIS — R262 Difficulty in walking, not elsewhere classified: Secondary | ICD-10-CM

## 2024-09-17 DIAGNOSIS — M25561 Pain in right knee: Secondary | ICD-10-CM

## 2024-09-17 DIAGNOSIS — M6281 Muscle weakness (generalized): Secondary | ICD-10-CM

## 2024-09-17 NOTE — Therapy (Signed)
 " OUTPATIENT PHYSICAL THERAPY LOWER EXTREMITY PROGRESS NOTE   Patient Name: Megan Greer MRN: [id number] DOB:September 28, 1955, 69 y.o., female Today's Date: 09/17/2024  END OF SESSION:  PT End of Session - 09/17/24 1443     Visit Number 6    Date for Recertification  10/23/24    Authorization Type [employer] 12 visits 1/15-3/12    Authorization - Visit Number 6    Authorization - Number of Visits 12    Progress Note Due on Visit 10    PT Start Time 1445    PT Stop Time 1525    PT Time Calculation (min) 40 min    Activity Tolerance Patient tolerated treatment well           Past Medical History:  Diagnosis Date   Anxiety    Diverticulosis    GERD (gastroesophageal reflux disease)    Heart murmur    History of obesity    Hypercholesteremia    Hyperlipidemia    Insomnia    Mitral valve regurgitation    mild   PONV (postoperative nausea and vomiting)    Prediabetes    PVC (premature ventricular contraction)    Serrated adenoma of colon 2015   Sinusitis    Past Surgical History:  Procedure Laterality Date   BUNIONECTOMY     Right foot    DILATION AND CURETTAGE OF UTERUS     HAMMER TOE SURGERY Left 04/03/2024   Procedure: Second hammertoe correction;  Surgeon: Kit Rush, MD;  Location: [facility];  Service: Orthopedics;  Laterality: Left;   WEIL OSTEOTOMY Left 04/03/2024   Procedure: Left Second metatarsal weil osteotomy;  Surgeon: Kit Rush, MD;  Location: [facility];  Service: Orthopedics;  Laterality: Left;   Patient Active Problem List   Diagnosis Date Noted   Abdominal pain, chronic, right lower quadrant 08/13/2013   Hypothyroidism 05/06/2013   Transaminitis 01/18/2012   Anxiety and depression 11/17/2011   Preventative health care 11/17/2011   Osteopenia 11/17/2011   Chronic insomnia 11/17/2011   Hyperlipidemia 11/17/2011   Gastroenteritis 11/17/2011    PCP: Charlott Carrier MD  REFERRING PROVIDER: Beverley Lye  MD  REFERRING DIAG: S83.91XD sprain of right knee  THERAPY DIAG:  Right knee pain; right knee stiffness; edema; difficulty in walking  Rationale for Evaluation and Treatment: Rehabilitation  ONSET DATE: 1  week   SUBJECTIVE:   SUBJECTIVE STATEMENT: My knee hasn't hurt much but then it started acting up yesterday with pain getting up from the chair.  Knee pain with lower seat level like a toilet.  Overall they've both gotten better.     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  Initially couldn't put weight on it.  Saw Dr. Beverley. X-ray OK. Used crutches for a short time. Wore a knee immobilizer through the weekend.  Got MRI knee sprain.  Removed immobilizer Monday. Feels like it might buckle but a fleeting sensation.  Using ice.    PERTINENT HISTORY: 2nd toe surgery on left side Left ACL deficient Had left knee PT at [facility] in 2022  PAIN:   Are you having pain? yes NPRS scale: 3/10 Pain location: right lateral and posterior/lateral knee pain; left knee pain Pain orientation: Right  PAIN TYPE: throbbing Pain description: constant  Aggravating factors: stiff after sitting, turning, lifting leg in/out of the car; limps toward the end of the day Relieving factors:  ice   PRECAUTIONS: None    WEIGHT BEARING RESTRICTIONS: No  FALLS:  Has patient fallen in last 6 months? No  LIVING ENVIRONMENT: Lives with: lives with their spouse Lives in: House/apartment Stairs: has stairs but bedroom downstairs   OCCUPATION: nurse  PLOF: Independent  PATIENT GOALS: be able to work (I'm completely out of PTO time)  works 7-8 hours shifts; unable to work since this happened    OBJECTIVE:  Note: Objective measures were completed at Evaluation unless otherwise noted.  DIAGNOSTIC FINDINGS: per MD note: MRI of the right knee shows no fracture but reveals a significant effusion. Independent  interpretation performed by me confirms no surgical intervention is required.   PATIENT SURVEYS:  LEFS  Extreme difficulty/unable (0), Quite a bit of difficulty (1), Moderate difficulty (2), Little difficulty (3), No difficulty (4) Survey date:  08/28/24  Any of your usual work, housework or school activities 2  2. Usual hobbies, recreational or sporting activities 0  3. Getting into/out of the bath 0  4. Walking between rooms 3  5. Putting on socks/shoes 2  6. Squatting  0  7. Lifting an object, like a bag of groceries from the floor 2  8. Performing light activities around your home 3  9. Performing heavy activities around your home 1  10. Getting into/out of a car 2  11. Walking 2 blocks 1  12. Walking 1 mile 0  13. Going up/down 10 stairs (1 flight) 1  14. Standing for 1 hour 2  15.  sitting for 1 hour 4  16. Running on even ground 0  17. Running on uneven ground 0  18. Making sharp turns while running fast 0  19. Hopping  0  20. Rolling over in bed 2  Score total:  25/80     COGNITION: Overall cognitive status: Within functional limits for tasks assessed      EDEMA:  Mild peripatellar edema  PALPATION: Tenderness lateral knee epicondyle  LOWER EXTREMITY ROM:  Active ROM Right eval Left eval 2/4  Hip flexion 100 110   Hip extension     Hip abduction     Hip adduction     Hip internal rotation     Hip external rotation     Knee flexion 137 142 140 R/L  Knee extension 3 0 0 R/L  Ankle dorsiflexion     Ankle plantarflexion     Ankle inversion     Ankle eversion      (Blank rows = not tested)  LOWER EXTREMITY MMT: Right quad lag with SLR  MMT Right eval Left eval 2/4  Hip flexion 4+ 5 R 4+  Hip extension     Hip abduction 4 4+ R 4  Hip adduction     Hip internal rotation     Hip external rotation     Knee flexion 4- 5 R 4  Knee extension 4- 5 R 4  Ankle dorsiflexion     Ankle plantarflexion     Ankle inversion     Ankle eversion      (Blank  rows = not tested)   FUNCTIONAL TESTS:  Requires moderate UE assist to rise sit to stand TUG 10.19 with UE assistance   GAIT:  Comments: no assistive device; decreased stance time on right  TREATMENT DATE:  09/17/24: Bike 5 min full revolutions L1- discussed wider stance and momentum for rising from lower seat heights/toilet Supine quad activation with small green ball behind knee x 15 bilateral 5 sec hold SLR 15x right/left, opp knee bent for comfort Les on green ball for knee flexion 10x HS sets on green ball 5 sec hold 10x  Bridge with Les over green ball 15x SL clam with red loop x 15 bilateral  Bil heel raises 10x HS stretch on 2nd step 20 sec hold 3x right/left TKEs blue power cord  x15 right/left  Vasocompression with elevation 10 min moderate compression left  09/11/24: Bike 5 min full revolutions L1  cues for speed Supine quad activation with towel roll x 15 bilateral 5 sec hold SLR 15x right/left, opp knee bent for comfort HS sets on green ball 5 sec hold 15x  Bridge with Les over green ball 15x Hooklying TA activation + hip abduction with red loop x 15 Hooklying TA activation + hip flexion with red loop x 20 total SL clam with red loop x 15 bilateral  Sit to stand x 10 (fatigue and increased knee valgus at rep 7) Bil heel raises 10x Calf stretch with rockerboard 2 x 30 sec  Hamstring stretch at stair 2 x 30 sec bilateral  Vasocompression with elevation 10 min moderate compression left   09/09/24: SLR 10x right/left, opp knee bent for comfort Supine clams with green band double and single 10x each cues to maintain space b/w knees on return Supine green ball rolls for flexion 15x HS sets on green ball 5 sec hold 10x submax effort <50% Bridge with Les over green ball 10x Seated quad set 5 sec holds 10x submax effort < 50% effort Bil heel  raises 10x HS stretch on 2nd step 20 sec hold 3x right/left Single leg standing tip forward to right/left 10x TKEs green band x15 right/left  Bike 5 min full revolutions L1  cues for speed Vasocompression with elevation 10 min moderate compression left  09/04/24: SLR 10x right/left Supine clams with green band double and single 10x each Supine green ball rolls for flexion 10x HS sets on green ball 10x Bridge with Les over green ball 10x Single leg standing 4 ways right/left 10x Bike 4 min full revolutions L1 TKEs green band x10 right/left  Vasocompression with elevation 10 min moderate compression    PATIENT EDUCATION:  Education details: Educated patient on anatomy and physiology of current symptoms, prognosis, plan of care as well as initial self care strategies to promote recovery Person educated: Patient Education method: Explanation Education comprehension: verbalized understanding  HOME EXERCISE PROGRAM: Access Code: [email or web address] URL: [email or web address] Date: 08/28/2024 Prepared by: Glade Pesa  Exercises - Supine Heel Slide  - 1 x daily - 7 x weekly - 1 sets - 10 reps - Supine Quad Set  - 1 x daily - 7 x weekly - 1 sets - 10 reps - Active Straight Leg Raise with Quad Set  - 1 x daily - 7 x weekly - 1 sets - 10 reps - Seated Heel Slide  - 1 x daily - 7 x weekly - 1 sets - 10 reps - Seated Long Arc Quad  - 1 x daily - 7 x weekly - 1 sets - 10 reps  ASSESSMENT:  CLINICAL IMPRESSION: Decreasing pain, symptom irritability and posterior knee edema.  Treatment emphasis on activation of quads needed for knee joint support as well as glute medius muscle activation needed for  patellofemoral alignment.  Therapist providing verbal cues to optimize technique  in order to achieve the greatest benefit.  Good progress with STGs.        OBJECTIVE IMPAIRMENTS: decreased activity tolerance, difficulty walking, decreased ROM, decreased strength, increased edema,  impaired perceived functional ability, and pain.   ACTIVITY LIMITATIONS: lifting, sitting, standing, squatting, stairs, bed mobility, and locomotion level  PARTICIPATION LIMITATIONS: meal prep, cleaning, laundry, shopping, community activity, and occupation  PERSONAL FACTORS: 1 comorbidity: left side musculoskeletal issues are also affecting patient's functional outcome.   REHAB POTENTIAL: Good  CLINICAL DECISION MAKING: Stable/uncomplicated  EVALUATION COMPLEXITY: Low   GOALS: Goals reviewed with patient? Yes  SHORT TERM GOALS: Target date: 09/25/2024   The patient will demonstrate knowledge of basic self care strategies and exercises to promote healing  Baseline: Goal status: met 2/4  2.  The patient will report a 30% improvement in pain levels with functional activities which are currently difficult including rising after sitting, turning, lifting leg in/out of the car Baseline:  Goal status: met 2/4  3.  Improved knee ROM 0-140 needed for greater ease with sit to stand and getting in/out of the car Baseline:  Goal status: met 2/4    LONG TERM GOALS: Target date: 10/23/2024    The patient will be independent in a safe self progression of a home exercise program to promote further recovery of function  Baseline:  Goal status: INITIAL  2.  The patient will report a 60% improvement in pain levels with functional activities which are currently difficult including rising after sitting, turning, lifting leg in/out of the car Baseline:  Goal status: INITIAL  3.  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  The patient will have improved LE strength of at least 4+/5 needed to ascend and descend steps reciprocally  Baseline:  Goal status: INITIAL  5.  LEFS functional outcome scale improved to   40/80   indicating improved function with less pain Baseline:  Goal status:  INITIAL     PLAN:  PT FREQUENCY: 1-2x/week PT DURATION: 8 weeks  PLANNED INTERVENTIONS: 97164- PT Re-evaluation, 97750- Physical Performance Testing, 97110-Therapeutic exercises, 97530- Therapeutic activity, 97112- Neuromuscular re-education, 97535- Self Care, [id number]- Manual therapy, [id number]- Gait training, [phone]- Aquatic Therapy, [phone]- Electrical stimulation (unattended), [phone]- Electrical stimulation (manual), [id number]- Vasopneumatic device, [id number]- Ultrasound, [id number]- Ionotophoresis 4mg /ml Dexamethasone , 79439 (1-2 muscles), 20561 (3+ muscles)- Dry Needling, Patient/Family education, Balance training, Stair training, Taping, Joint mobilization, Cryotherapy, and Moist heat  PLAN FOR NEXT SESSION: assess response to treatment session;  bike or Nu-Step; quad activation ex's; vasocompression as needed  Glade Pesa, PT 09/17/24 4:33 PM Phone: [phone] Fax: [phone]                                                                                                     "

## 2024-09-18 ENCOUNTER — Other Ambulatory Visit (HOSPITAL_COMMUNITY): Payer: Self-pay

## 2024-09-18 ENCOUNTER — Ambulatory Visit: Admitting: Physical Therapy

## 2024-09-23 ENCOUNTER — Ambulatory Visit: Admitting: Physical Therapy

## 2024-09-30 ENCOUNTER — Ambulatory Visit: Admitting: Physical Therapy

## 2024-10-02 ENCOUNTER — Ambulatory Visit: Admitting: Physical Therapy

## 2024-10-07 ENCOUNTER — Ambulatory Visit: Admitting: Physical Therapy

## 2024-10-14 ENCOUNTER — Ambulatory Visit: Admitting: Physical Therapy

## 2024-10-21 ENCOUNTER — Ambulatory Visit: Admitting: Physical Therapy
